# Patient Record
Sex: Male | Born: 1945 | Race: Black or African American | Hispanic: No | Marital: Married | Smoking: Former smoker
Health system: Southern US, Community
[De-identification: ages and names within clinical notes are randomized; demographics above are authoritative.]

## PROBLEM LIST (undated history)

## (undated) DIAGNOSIS — I219 Acute myocardial infarction, unspecified: Secondary | ICD-10-CM

## (undated) DIAGNOSIS — B1920 Unspecified viral hepatitis C without hepatic coma: Secondary | ICD-10-CM

## (undated) DIAGNOSIS — E118 Type 2 diabetes mellitus with unspecified complications: Secondary | ICD-10-CM

## (undated) DIAGNOSIS — I214 Non-ST elevation (NSTEMI) myocardial infarction: Secondary | ICD-10-CM

## (undated) DIAGNOSIS — I25119 Atherosclerotic heart disease of native coronary artery with unspecified angina pectoris: Secondary | ICD-10-CM

## (undated) DIAGNOSIS — I10 Essential (primary) hypertension: Secondary | ICD-10-CM

## (undated) DIAGNOSIS — Z951 Presence of aortocoronary bypass graft: Secondary | ICD-10-CM

## (undated) DIAGNOSIS — E785 Hyperlipidemia, unspecified: Secondary | ICD-10-CM

## (undated) DIAGNOSIS — D649 Anemia, unspecified: Secondary | ICD-10-CM

## (undated) HISTORY — DX: Essential (primary) hypertension: I10

## (undated) HISTORY — PX: LAPAROSCOPIC CHOLECYSTECTOMY: SUR755

## (undated) HISTORY — PX: UPPER GASTROINTESTINAL ENDOSCOPY: SHX188

## (undated) HISTORY — DX: Type 2 diabetes mellitus with unspecified complications: E11.8

## (undated) HISTORY — DX: Presence of aortocoronary bypass graft: Z95.1

## (undated) HISTORY — PX: COLONOSCOPY: SHX174

## (undated) HISTORY — DX: Anemia, unspecified: D64.9

## (undated) HISTORY — DX: Atherosclerotic heart disease of native coronary artery with unspecified angina pectoris: I25.119

## (undated) HISTORY — DX: Non-ST elevation (NSTEMI) myocardial infarction: I21.4

---

## 1999-09-04 DIAGNOSIS — I219 Acute myocardial infarction, unspecified: Secondary | ICD-10-CM

## 1999-09-04 DIAGNOSIS — Z951 Presence of aortocoronary bypass graft: Secondary | ICD-10-CM

## 1999-09-04 HISTORY — PX: CORONARY ARTERY BYPASS GRAFT: SHX141

## 1999-09-04 HISTORY — DX: Presence of aortocoronary bypass graft: Z95.1

## 1999-09-04 HISTORY — DX: Acute myocardial infarction, unspecified: I21.9

## 2003-09-04 DIAGNOSIS — I25119 Atherosclerotic heart disease of native coronary artery with unspecified angina pectoris: Secondary | ICD-10-CM

## 2003-09-04 HISTORY — DX: Atherosclerotic heart disease of native coronary artery with unspecified angina pectoris: I25.119

## 2004-07-06 DIAGNOSIS — I2510 Atherosclerotic heart disease of native coronary artery without angina pectoris: Secondary | ICD-10-CM | POA: Diagnosis present

## 2012-05-06 DIAGNOSIS — K3189 Other diseases of stomach and duodenum: Secondary | ICD-10-CM | POA: Insufficient documentation

## 2012-05-06 DIAGNOSIS — K31A0 Gastric intestinal metaplasia, unspecified: Secondary | ICD-10-CM | POA: Insufficient documentation

## 2012-11-18 DIAGNOSIS — B182 Chronic viral hepatitis C: Secondary | ICD-10-CM | POA: Insufficient documentation

## 2014-03-02 ENCOUNTER — Other Ambulatory Visit: Payer: Self-pay | Admitting: Gastroenterology

## 2014-03-02 ENCOUNTER — Other Ambulatory Visit: Payer: Self-pay | Admitting: *Deleted

## 2014-03-02 DIAGNOSIS — B182 Chronic viral hepatitis C: Secondary | ICD-10-CM

## 2014-03-02 DIAGNOSIS — K7460 Unspecified cirrhosis of liver: Secondary | ICD-10-CM

## 2014-03-30 ENCOUNTER — Ambulatory Visit
Admission: RE | Admit: 2014-03-30 | Discharge: 2014-03-30 | Disposition: A | Discharge status: Home / Self Care | Payer: Medicare Other | Source: Ambulatory Visit | Attending: *Deleted | Admitting: *Deleted

## 2014-03-30 DIAGNOSIS — K7460 Unspecified cirrhosis of liver: Secondary | ICD-10-CM

## 2014-03-30 DIAGNOSIS — B182 Chronic viral hepatitis C: Secondary | ICD-10-CM

## 2014-03-30 MED ORDER — GADOXETATE DISODIUM 0.25 MMOL/ML IV SOLN
8.0000 mL | Freq: Once | INTRAVENOUS | Status: AC | PRN
  Administered 2014-03-30: 8 mL via INTRAVENOUS

## 2016-02-29 ENCOUNTER — Encounter (INDEPENDENT_AMBULATORY_CARE_PROVIDER_SITE_OTHER): Payer: Medicare Other | Admitting: Podiatry

## 2016-02-29 NOTE — Progress Notes (Signed)
This encounter was created in error - please disregard.

## 2016-04-03 DIAGNOSIS — I214 Non-ST elevation (NSTEMI) myocardial infarction: Secondary | ICD-10-CM

## 2016-04-03 HISTORY — DX: Non-ST elevation (NSTEMI) myocardial infarction: I21.4

## 2016-04-03 HISTORY — PX: TRANSTHORACIC ECHOCARDIOGRAM: SHX275

## 2016-04-21 ENCOUNTER — Emergency Department (HOSPITAL_COMMUNITY): Payer: Medicare Other

## 2016-04-21 ENCOUNTER — Inpatient Hospital Stay (HOSPITAL_COMMUNITY)
Admission: EM | Admit: 2016-04-21 | Discharge: 2016-04-24 | DRG: 246 | Disposition: A | Discharge status: Home / Self Care | Payer: Medicare Other | Attending: Cardiology | Admitting: Cardiology

## 2016-04-21 ENCOUNTER — Encounter (HOSPITAL_COMMUNITY): Payer: Self-pay | Admitting: Emergency Medicine

## 2016-04-21 DIAGNOSIS — R001 Bradycardia, unspecified: Secondary | ICD-10-CM | POA: Diagnosis present

## 2016-04-21 DIAGNOSIS — I25110 Atherosclerotic heart disease of native coronary artery with unstable angina pectoris: Secondary | ICD-10-CM | POA: Diagnosis present

## 2016-04-21 DIAGNOSIS — I10 Essential (primary) hypertension: Secondary | ICD-10-CM | POA: Diagnosis present

## 2016-04-21 DIAGNOSIS — I214 Non-ST elevation (NSTEMI) myocardial infarction: Secondary | ICD-10-CM | POA: Diagnosis present

## 2016-04-21 DIAGNOSIS — R51 Headache: Secondary | ICD-10-CM | POA: Diagnosis present

## 2016-04-21 DIAGNOSIS — E785 Hyperlipidemia, unspecified: Secondary | ICD-10-CM | POA: Diagnosis not present

## 2016-04-21 DIAGNOSIS — G479 Sleep disorder, unspecified: Secondary | ICD-10-CM | POA: Diagnosis present

## 2016-04-21 DIAGNOSIS — E119 Type 2 diabetes mellitus without complications: Secondary | ICD-10-CM | POA: Diagnosis not present

## 2016-04-21 DIAGNOSIS — I160 Hypertensive urgency: Secondary | ICD-10-CM | POA: Diagnosis present

## 2016-04-21 DIAGNOSIS — Y838 Other surgical procedures as the cause of abnormal reaction of the patient, or of later complication, without mention of misadventure at the time of the procedure: Secondary | ICD-10-CM | POA: Diagnosis not present

## 2016-04-21 DIAGNOSIS — Z8674 Personal history of sudden cardiac arrest: Secondary | ICD-10-CM | POA: Diagnosis not present

## 2016-04-21 DIAGNOSIS — I25119 Atherosclerotic heart disease of native coronary artery with unspecified angina pectoris: Secondary | ICD-10-CM | POA: Diagnosis present

## 2016-04-21 DIAGNOSIS — E1122 Type 2 diabetes mellitus with diabetic chronic kidney disease: Secondary | ICD-10-CM | POA: Diagnosis not present

## 2016-04-21 DIAGNOSIS — I119 Hypertensive heart disease without heart failure: Secondary | ICD-10-CM | POA: Diagnosis not present

## 2016-04-21 DIAGNOSIS — I25810 Atherosclerosis of coronary artery bypass graft(s) without angina pectoris: Secondary | ICD-10-CM | POA: Diagnosis not present

## 2016-04-21 DIAGNOSIS — T82858A Stenosis of vascular prosthetic devices, implants and grafts, initial encounter: Secondary | ICD-10-CM | POA: Diagnosis not present

## 2016-04-21 DIAGNOSIS — Z951 Presence of aortocoronary bypass graft: Secondary | ICD-10-CM

## 2016-04-21 DIAGNOSIS — E1121 Type 2 diabetes mellitus with diabetic nephropathy: Secondary | ICD-10-CM | POA: Diagnosis not present

## 2016-04-21 DIAGNOSIS — I213 ST elevation (STEMI) myocardial infarction of unspecified site: Secondary | ICD-10-CM | POA: Diagnosis not present

## 2016-04-21 DIAGNOSIS — I2510 Atherosclerotic heart disease of native coronary artery without angina pectoris: Secondary | ICD-10-CM | POA: Diagnosis present

## 2016-04-21 DIAGNOSIS — E1169 Type 2 diabetes mellitus with other specified complication: Secondary | ICD-10-CM | POA: Diagnosis present

## 2016-04-21 DIAGNOSIS — I25700 Atherosclerosis of coronary artery bypass graft(s), unspecified, with unstable angina pectoris: Secondary | ICD-10-CM | POA: Diagnosis not present

## 2016-04-21 HISTORY — DX: Hyperlipidemia, unspecified: E78.5

## 2016-04-21 HISTORY — DX: Unspecified viral hepatitis C without hepatic coma: B19.20

## 2016-04-21 LAB — I-STAT TROPONIN, ED: Troponin i, poc: 0.93 ng/mL (ref 0.00–0.08)

## 2016-04-21 LAB — BASIC METABOLIC PANEL
Anion gap: 6 (ref 5–15)
BUN: 16 mg/dL (ref 6–20)
CALCIUM: 9.4 mg/dL (ref 8.9–10.3)
CHLORIDE: 106 mmol/L (ref 101–111)
CO2: 23 mmol/L (ref 22–32)
CREATININE: 1.13 mg/dL (ref 0.61–1.24)
GFR calc non Af Amer: >60 mL/min (ref >60)
GLUCOSE: 137 mg/dL — AB (ref 65–99)
Potassium: 3.8 mmol/L (ref 3.5–5.1)
Sodium: 135 mmol/L (ref 135–145)

## 2016-04-21 LAB — HEPATIC FUNCTION PANEL
ALK PHOS: 43 U/L (ref 38–126)
ALT: 20 U/L (ref 17–63)
AST: 40 U/L (ref 15–41)
Albumin: 3.8 g/dL (ref 3.5–5.0)
Bilirubin, Direct: <0.1 mg/dL — ABNORMAL LOW (ref 0.1–0.5)
TOTAL PROTEIN: 7.4 g/dL (ref 6.5–8.1)
Total Bilirubin: 0.2 mg/dL — ABNORMAL LOW (ref 0.3–1.2)

## 2016-04-21 LAB — LIPID PANEL
CHOL/HDL RATIO: 3.1 ratio
Cholesterol: 183 mg/dL (ref 0–200)
HDL: 60 mg/dL (ref >40)
LDL CALC: 109 mg/dL — AB (ref 0–99)
Triglycerides: 68 mg/dL (ref <150)
VLDL: 14 mg/dL (ref 0–40)

## 2016-04-21 LAB — CBC
HCT: 39.9 % (ref 39.0–52.0)
Hemoglobin: 12.9 g/dL — ABNORMAL LOW (ref 13.0–17.0)
MCH: 23.1 pg — ABNORMAL LOW (ref 26.0–34.0)
MCHC: 32.3 g/dL (ref 30.0–36.0)
MCV: 71.5 fL — AB (ref 78.0–100.0)
PLATELETS: 154 10*3/uL (ref 150–400)
RBC: 5.58 MIL/uL (ref 4.22–5.81)
RDW: 15 % (ref 11.5–15.5)
WBC: 6.3 10*3/uL (ref 4.0–10.5)

## 2016-04-21 LAB — PROTIME-INR
INR: 1.07
PROTHROMBIN TIME: 14 s (ref 11.4–15.2)

## 2016-04-21 LAB — TROPONIN I
TROPONIN I: 3.46 ng/mL — AB (ref <0.03)
Troponin I: 1.17 ng/mL (ref <0.03)

## 2016-04-21 LAB — APTT: aPTT: 31 seconds (ref 24–36)

## 2016-04-21 LAB — TSH: TSH: 0.871 u[IU]/mL (ref 0.350–4.500)

## 2016-04-21 MED ORDER — ACETAMINOPHEN 325 MG PO TABS: 650.0000 mg | ORAL_TABLET | ORAL | Status: DC | PRN

## 2016-04-21 MED ORDER — LISINOPRIL 10 MG PO TABS
20.0000 mg | ORAL_TABLET | Freq: Every day | ORAL | Status: DC
  Administered 2016-04-21 – 2016-04-24 (×3): 20 mg via ORAL
  Filled 2016-04-21: qty 2
  Filled 2016-04-21 (×2): qty 1

## 2016-04-21 MED ORDER — METFORMIN HCL ER 500 MG PO TB24
500.0000 mg | ORAL_TABLET | Freq: Every day | ORAL | Status: DC
  Filled 2016-04-21 (×2): qty 1

## 2016-04-21 MED ORDER — NITROGLYCERIN 0.4 MG SL SUBL: 0.4000 mg | SUBLINGUAL_TABLET | SUBLINGUAL | Status: DC | PRN

## 2016-04-21 MED ORDER — OMEGA-3-ACID ETHYL ESTERS 1 G PO CAPS
1.0000 g | ORAL_CAPSULE | Freq: Every day | ORAL | Status: DC
  Administered 2016-04-22 – 2016-04-24 (×2): 1 g via ORAL
  Filled 2016-04-21 (×2): qty 1

## 2016-04-21 MED ORDER — NITROGLYCERIN IN D5W 200-5 MCG/ML-% IV SOLN
5.0000 ug/min | INTRAVENOUS | Status: DC
  Administered 2016-04-21: 5 ug/min via INTRAVENOUS
  Administered 2016-04-22: 40 ug/min via INTRAVENOUS
  Filled 2016-04-21 (×2): qty 250

## 2016-04-21 MED ORDER — ASPIRIN EC 81 MG PO TBEC
81.0000 mg | DELAYED_RELEASE_TABLET | Freq: Every day | ORAL | Status: DC
  Administered 2016-04-22 – 2016-04-24 (×3): 81 mg via ORAL
  Filled 2016-04-21 (×3): qty 1

## 2016-04-21 MED ORDER — ONDANSETRON HCL 4 MG/2ML IJ SOLN
4.0000 mg | Freq: Four times a day (QID) | INTRAMUSCULAR | Status: DC | PRN
Start: 2016-04-21 — End: 2016-04-24

## 2016-04-21 MED ORDER — HYDROCHLOROTHIAZIDE 25 MG PO TABS
12.5000 mg | ORAL_TABLET | Freq: Every day | ORAL | Status: DC
Start: 2016-04-21 — End: 2016-04-24
  Administered 2016-04-21 – 2016-04-24 (×3): 12.5 mg via ORAL
  Filled 2016-04-21 (×3): qty 1

## 2016-04-21 MED ORDER — ASPIRIN 81 MG PO CHEW
324.0000 mg | CHEWABLE_TABLET | Freq: Once | ORAL | Status: AC
  Administered 2016-04-21: 324 mg via ORAL
  Filled 2016-04-21: qty 4

## 2016-04-21 MED ORDER — METOPROLOL SUCCINATE ER 25 MG PO TB24: 12.5000 mg | ORAL_TABLET | Freq: Every day | ORAL | Status: DC

## 2016-04-21 MED ORDER — LISINOPRIL 20 MG PO TABS: 20.0000 mg | ORAL_TABLET | Freq: Every day | ORAL | Status: DC

## 2016-04-21 MED ORDER — MORPHINE SULFATE (PF) 2 MG/ML IV SOLN
2.0000 mg | Freq: Once | INTRAVENOUS | Status: DC
Start: 2016-04-21 — End: 2016-04-24

## 2016-04-21 MED ORDER — HYDROCHLOROTHIAZIDE 25 MG PO TABS: 12.5000 mg | ORAL_TABLET | Freq: Every day | ORAL | Status: DC

## 2016-04-21 MED ORDER — CALCIUM CARBONATE 1250 (500 CA) MG PO TABS
2.0000 | ORAL_TABLET | Freq: Every day | ORAL | Status: DC
  Administered 2016-04-22 – 2016-04-24 (×2): 1000 mg via ORAL
  Filled 2016-04-21 (×2): qty 1

## 2016-04-21 MED ORDER — HEPARIN (PORCINE) IN NACL 100-0.45 UNIT/ML-% IJ SOLN
1100.0000 [IU]/h | INTRAMUSCULAR | Status: DC
  Administered 2016-04-21 – 2016-04-22 (×2): 1100 [IU]/h via INTRAVENOUS
  Filled 2016-04-21 (×2): qty 250

## 2016-04-21 MED ORDER — NITROGLYCERIN 2 % TD OINT
1.0000 [in_us] | TOPICAL_OINTMENT | Freq: Once | TRANSDERMAL | Status: AC
  Administered 2016-04-21: 1 [in_us] via TOPICAL
  Filled 2016-04-21: qty 1

## 2016-04-21 MED ORDER — PRAVASTATIN SODIUM 40 MG PO TABS
80.0000 mg | ORAL_TABLET | Freq: Every day | ORAL | Status: DC
  Administered 2016-04-22: 80 mg via ORAL
  Filled 2016-04-21: qty 2

## 2016-04-21 MED ORDER — ALUM & MAG HYDROXIDE-SIMETH 200-200-20 MG/5ML PO SUSP
30.0000 mL | ORAL | Status: DC | PRN
  Administered 2016-04-22: 30 mL via ORAL
  Filled 2016-04-21: qty 30

## 2016-04-21 MED ORDER — METOPROLOL SUCCINATE ER 25 MG PO TB24
12.5000 mg | ORAL_TABLET | Freq: Every day | ORAL | Status: DC
  Administered 2016-04-21 – 2016-04-24 (×2): 12.5 mg via ORAL
  Filled 2016-04-21 (×3): qty 1

## 2016-04-21 MED ORDER — HEPARIN BOLUS VIA INFUSION
4000.0000 [IU] | Freq: Once | INTRAVENOUS | Status: AC
  Administered 2016-04-21: 4000 [IU] via INTRAVENOUS
  Filled 2016-04-21: qty 4000

## 2016-04-21 MED ORDER — MORPHINE SULFATE (PF) 4 MG/ML IV SOLN
4.0000 mg | Freq: Once | INTRAVENOUS | Status: AC
  Administered 2016-04-21: 4 mg via INTRAVENOUS
  Filled 2016-04-21: qty 1

## 2016-04-21 NOTE — ED Notes (Signed)
Nitroglycerin infusion increased to 30 mcg/min at this time.  Pt rates his chest pain at a 3/10.  Pt's bp noted to be 158/88.

## 2016-04-21 NOTE — ED Notes (Signed)
Nitro paste removed.

## 2016-04-21 NOTE — ED Notes (Signed)
Nitroglycerin infusion increased to 25 mcg/min at this time. Pt rates his chest pain at a 4/10.  Bp noted to be 169/89.

## 2016-04-21 NOTE — ED Notes (Signed)
Report called to Wells Guiles, RN at this time.  Receiving nurse denies having any further questions at this time.

## 2016-04-21 NOTE — ED Triage Notes (Signed)
Pt c/o center chest pain burning onset yesterday with nausea.

## 2016-04-21 NOTE — ED Notes (Signed)
Nitroglycerin infusion increased to 10 mcg/min at this time.  Pt reports chest pain rated 5/10.

## 2016-04-21 NOTE — ED Notes (Signed)
Nitroglycerin infusion increased to 15 mcg/min at this time.  Pt reports chest pain remains at a 5/10.  Pt's bp noted to be 161/87.

## 2016-04-21 NOTE — Progress Notes (Signed)
ANTICOAGULATION CONSULT NOTE - Initial Consult  Pharmacy Consult for heparin Indication: chest pain/ACS  No Known Allergies  Patient Measurements: Height: 6\' 1"  (185.4 cm) Weight: 189 lb 2 oz (85.8 kg) IBW/kg (Calculated) : 79.9 Heparin Dosing Weight: 85.8 kg  Vital Signs: Temp: 97.8 F (36.6 C) (08/19 1605) Temp Source: Oral (08/19 1605) BP: 186/96 (08/19 1745) Pulse Rate: 56 (08/19 1745)  Labs:  Recent Labs  04/21/16 1614 04/21/16 1748  HGB 12.9*  --   HCT 39.9  --   PLT 154  --   APTT  --  31  LABPROT  --  14.0  INR  --  1.07  CREATININE 1.13  --     Estimated Creatinine Clearance: 70.7 mL/min (by C-G formula based on SCr of 1.13 mg/dL).   Medical History: Past Medical History:  Diagnosis Date  . Diabetes mellitus without complication ([place])   . Hypertension     Assessment: 70 yo m admitted with CP.  Pharmacy is consulted to start heparin for ACS. PTA med list not reviewed yet, but as far as I can see, pt is not on any anticoagulation PTA. CBC wnl at baseline.  Goal of Therapy:  Heparin level 0.3-0.7 units/ml Monitor platelets by anticoagulation protocol: Yes   Plan:  - Heparin bolus 4,000 units x 1 - Heparin infusion at 1100 units/hr - 6-hr HL @ 0100 - Daily HL, CBC - Monitor s/sx of bleeding  Cassie L. Nicole Kindred, PharmD Clinical Pharmacist Pager: [phone] 04/21/2016 6:19 PM

## 2016-04-21 NOTE — H&P (Signed)
Cardiology H&P    Patient ID: Brandon Vasquez MRN: [id number], DOB/AGE: 1945-10-21   Admit date: 04/21/2016 Date of Consult: 04/21/2016  Primary Physician: No PCP Per Patient Reason for Admission: Chest Pain    History of Present Illness    70 year old male with past medical history of hypertension, diabetes, coronary artery disease s/p 5 vessel CABG (grafts unknown), and hyperlipidemia presents with a chief complaint of chest pain that he describes a burning in sensation. The patient had a coronary artery bypass in 2001 and prior to his surgery he had similar burning sensation without radiation. As per the patient, prior to his surgery he had a cardiac arrest in the cardiac catheterization lab. He states that the pain is not associated with radiation or diaphoresis. He rates the pain a 8/10 upon his initial interview despite the nitropatch. In the emergency department, the patient was given 2mg  of morphine and a nitro patch. The pain did not improve but he was then started on intravenous nitroglycerin and his pain has since improved.   Past Medical History   Past Medical History:  Diagnosis Date  . Diabetes mellitus without complication ([place])   . Hypertension     Past Surgical History:  Procedure Laterality Date  . CORONARY ARTERY BYPASS GRAFT       Allergies  No Known Allergies  Inpatient Medications    .  morphine injection  2 mg Intravenous Once    Family History    No family history on file.  Social History    Social History   Social History  . Marital status: Married    Spouse name: N/A  . Number of children: N/A  . Years of education: N/A   Occupational History  . Not on file.   Social History Main Topics  . Smoking status: Never Smoker  . Smokeless tobacco: Never Used  . Alcohol use No  . Drug use:     Types: Marijuana  . Sexual activity: Not on file   Other Topics Concern  . Not on file   Social History Narrative  . No narrative on file      Review of Systems    General:  No chills, fever, night sweats or weight changes.  Cardiovascular:  Positive for chest pain.  Dermatological: No rash, lesions/masses Respiratory: No cough, dyspnea Urologic: No hematuria, dysuria Abdominal:   No nausea, vomiting, diarrhea, bright red blood per rectum, melena, or hematemesis Neurologic:  No visual changes, wkns, changes in mental status. All other systems reviewed and are otherwise negative except as noted above.  Physical Exam    Blood pressure 158/88, pulse (!) 50, temperature 97.8 F (36.6 C), temperature source Oral, resp. rate 16, height 6\' 1"  (1.854 m), weight 189 lb 2 oz (85.8 kg), SpO2 100 %.  GEN: NAD, A&Ox3 HEENT: NCAT, EOMI, No JVD CV: RRR, +S1/S2, No m/r/g's appreciated.   Pulm: CTA bilaterally  Abdomen: +BS, NTND Extremities: No c/c/e, 2+ pulses  Neuro: No focal neurological deficits, CN II-XII Grossly intact.   Labs    Troponin [facility] Test)  Recent Labs  04/21/16 1619  TROPIPOC 0.93*    Recent Labs  04/21/16 1748  TROPONINI 1.17*   Lab Results  Component Value Date   WBC 6.3 04/21/2016   HGB 12.9 (L) 04/21/2016   HCT 39.9 04/21/2016   MCV 71.5 (L) 04/21/2016   PLT 154 04/21/2016    Recent Labs Lab 04/21/16 1614 04/21/16 1748  NA 135  --  K 3.8  --   CL 106  --   CO2 23  --   BUN 16  --   CREATININE 1.13  --   CALCIUM 9.4  --   PROT  --  7.4  BILITOT  --  0.2*  ALKPHOS  --  43  ALT  --  20  AST  --  40  GLUCOSE 137*  --    Lab Results  Component Value Date   CHOL 183 04/21/2016   HDL 60 04/21/2016   LDLCALC 109 (H) 04/21/2016   TRIG 68 04/21/2016   No results found for: [facility]   Radiology Studies    Dg Chest 2 View  Result Date: 04/21/2016 CLINICAL DATA:  Central chest pain starting yesterday, nausea EXAM: CHEST  2 VIEW COMPARISON:  None. FINDINGS: Cardiomediastinal silhouette is unremarkable. Status post CABG. No acute infiltrate or pleural effusion. No pulmonary  edema. Bony thorax is unremarkable. IMPRESSION: No active cardiopulmonary disease. Electronically Signed   By: Lahoma Crocker M.D.   On: 04/21/2016 17:19    EKG & Cardiac Imaging    EKG: NSR with no acute ST/T wave abnormalities.    Assessment & Plan    70 year old male with past medical history of hypertension, diabetes, coronary artery disease s/p 5 vessel CABG (grafts unknown), and hyperlipidemia presents with a chief complaint of chest pain.  1. NSTEMI -Will check cardiac enzymes x 3; patient's chest pain likely his anginal equivalent; NSTEMI likely a Type II MI in the setting of this hypertension. (SBP 200 on admission) -Will continue IV nitro gtt and monitor in CCU. Continue ASA, statin, beta blockade therapy, and lisinopril.  -Check 2D echo; Case reviewed with Dr. Ellyn Hack and no need for cath lab at this time. If there are RWMA's on echocardiogram will pursue further ischemic workup with likely a nuclear stress test.   2. CAD s/p CABG (grafts unknown) -Will continue statin therapy, ACEi, and Toprol Xl.   3. Hypertension -Patient's blood pressure improving with nitro gtt; Will continue to titrate as needed. Will continue nitro and outpatient medications.   4. Diabetes -Continue outpatient meds.   Signed,  Lucas Cell: [phone]

## 2016-04-21 NOTE — Plan of Care (Signed)
Problem: Health Behavior/Discharge Planning: Goal: Ability to manage health-related needs will improve Outcome: Progressing Discussed advanced directive, patient and wife have talked and agree she is his decision maker if needed, consult to spiritual care to make official  Patient appears to have good support with wife and has good coping behaviors  Problem: Activity: Goal: Risk for activity intolerance will decrease Outcome: Not Progressing Currently on bedrest

## 2016-04-21 NOTE — ED Provider Notes (Signed)
[facility] DEPT Provider Note   CSN: [id number] Arrival date & time: 04/21/16  1600     History   Chief Complaint Chief Complaint  Patient presents with  . Chest Pain    HPI Brandon Vasquez is a 70 y.o. male.  HPI  Patient history of diabetes, hypertension, hyperlipidemia, CABG X5, presents for chest pain. Started yesterday. Has been off and on. It feels like a burning sensation that he currently rates as a 6 out of 10. Denies any shortness of breath, coughs, fevers. He does have associated nausea with the pain.  Past Medical History:  Diagnosis Date  . Diabetes mellitus without complication ([place])   . Hypertension     There are no active problems to display for this patient.   Past Surgical History:  Procedure Laterality Date  . CORONARY ARTERY BYPASS GRAFT         Home Medications    Prior to Admission medications   Not on File    Family History No family history on file.  Social History Social History  Substance Use Topics  . Smoking status: Never Smoker  . Smokeless tobacco: Never Used  . Alcohol use No     Allergies   Review of patient's allergies indicates no known allergies.   Review of Systems Review of Systems  Constitutional: Negative for chills and fever.  HENT: Negative for ear pain and sore throat.   Eyes: Negative for pain and visual disturbance.  Respiratory: Negative for cough and shortness of breath.   Cardiovascular: Positive for chest pain. Negative for palpitations.  Gastrointestinal: Positive for nausea. Negative for abdominal pain and vomiting.  Genitourinary: Negative for dysuria and hematuria.  Musculoskeletal: Negative for arthralgias and back pain.  Skin: Negative for color change and rash.  Neurological: Negative for seizures and syncope.  All other systems reviewed and are negative.    Physical Exam Updated Vital Signs BP 186/96   Pulse (!) 56   Temp 97.8 F (36.6 C) (Oral)   Resp 12   Ht 6\' 1"  (1.854 m)    Wt 85.8 kg   SpO2 100%   BMI 24.95 kg/m   Physical Exam  Constitutional: He appears well-developed and well-nourished.  HENT:  Head: Normocephalic and atraumatic.  Eyes: Conjunctivae are normal.  Neck: Neck supple.  Cardiovascular: Normal rate and regular rhythm.   No murmur heard. Pulmonary/Chest: Effort normal and breath sounds normal. No respiratory distress.  Abdominal: Soft. There is no tenderness.  Musculoskeletal: He exhibits no edema.  Neurological: He is alert.  Skin: Skin is warm and dry.  Psychiatric: He has a normal mood and affect.  Nursing note and vitals reviewed.    ED Treatments / Results  Labs (all labs ordered are listed, but only abnormal results are displayed) Labs Reviewed  BASIC METABOLIC PANEL - Abnormal; Notable for the following:       Result Value   Glucose, Bld 137 (*)    All other components within normal limits  CBC - Abnormal; Notable for the following:    Hemoglobin 12.9 (*)    MCV 71.5 (*)    MCH 23.1 (*)    All other components within normal limits  I-STAT TROPOININ, ED - Abnormal; Notable for the following:    Troponin i, poc 0.93 (*)    All other components within normal limits  PROTIME-INR  APTT  TROPONIN I  HEPATIC FUNCTION PANEL  LIPID PANEL  TSH    EKG  EKG Interpretation  Date/Time:  Saturday April 21 2016 16:10:40 EDT Ventricular Rate:  57 PR Interval:  158 QRS Duration: 80 QT Interval:  424 QTC Calculation: 412 R Axis:   82 Text Interpretation:  Unusual P axis, possible ectopic atrial bradycardia Abnormal ECG No old tracing to compare Confirmed by FLOYD MD, DANIEL [id number]) on 04/21/2016 5:11:47 PM       Radiology Dg Chest 2 View  Result Date: 04/21/2016 CLINICAL DATA:  Central chest pain starting yesterday, nausea EXAM: CHEST  2 VIEW COMPARISON:  None. FINDINGS: Cardiomediastinal silhouette is unremarkable. Status post CABG. No acute infiltrate or pleural effusion. No pulmonary edema. Bony thorax is  unremarkable. IMPRESSION: No active cardiopulmonary disease. Electronically Signed   By: Lahoma Crocker M.D.   On: 04/21/2016 17:19    Procedures Procedures (including critical care time)  Medications Ordered in ED Medications - No data to display   Initial Impression / Assessment and Plan / ED Course  I have reviewed the triage vital signs and the nursing notes.  Pertinent labs & imaging results that were available during my care of the patient were reviewed by me and considered in my medical decision making (see chart for details).  Clinical Course    Patient was clinically stable. EKG was normal sinus rhythm, no ischemic changes. Troponin was elevated. Concern for an NSTEMI. Aspirin, heparin, nitroglycerin ordered. Cardiology consulted patient will be admitted.  Patient stable upon admission.  Final Clinical Impressions(s) / ED Diagnoses   Final diagnoses:  None    New Prescriptions New Prescriptions   No medications on file     Levada Schilling, MD 04/21/16 [place], MD 04/22/16 [phone]

## 2016-04-21 NOTE — ED Notes (Signed)
Nitroglycerin infusion increased to 35 mcg/min at this time.  Pt continues to rate his chest pain at a 3/10.  Bp noted to be 153/85.

## 2016-04-21 NOTE — ED Notes (Signed)
Lab called with I-stat Troponin 0.93.  Called and reported to Dr. Tyrone Nine.  No orders received.

## 2016-04-21 NOTE — ED Notes (Signed)
Nitroglycerin infusion increased to 20 mcg/min at this time.  Pt rates his chest pain at a 4/10.  bp noted to be 169/82.

## 2016-04-22 ENCOUNTER — Inpatient Hospital Stay (HOSPITAL_COMMUNITY): Payer: Medicare Other

## 2016-04-22 ENCOUNTER — Encounter (HOSPITAL_COMMUNITY): Payer: Self-pay | Admitting: *Deleted

## 2016-04-22 DIAGNOSIS — I214 Non-ST elevation (NSTEMI) myocardial infarction: Secondary | ICD-10-CM

## 2016-04-22 DIAGNOSIS — I25700 Atherosclerosis of coronary artery bypass graft(s), unspecified, with unstable angina pectoris: Secondary | ICD-10-CM

## 2016-04-22 DIAGNOSIS — I213 ST elevation (STEMI) myocardial infarction of unspecified site: Secondary | ICD-10-CM

## 2016-04-22 DIAGNOSIS — I160 Hypertensive urgency: Secondary | ICD-10-CM

## 2016-04-22 LAB — BASIC METABOLIC PANEL
ANION GAP: 13 (ref 5–15)
BUN: 14 mg/dL (ref 6–20)
CHLORIDE: 104 mmol/L (ref 101–111)
CO2: 22 mmol/L (ref 22–32)
CREATININE: 1.04 mg/dL (ref 0.61–1.24)
Calcium: 9.4 mg/dL (ref 8.9–10.3)
GFR calc non Af Amer: >60 mL/min (ref >60)
Glucose, Bld: 128 mg/dL — ABNORMAL HIGH (ref 65–99)
POTASSIUM: 3.9 mmol/L (ref 3.5–5.1)
SODIUM: 139 mmol/L (ref 135–145)

## 2016-04-22 LAB — HEPARIN LEVEL (UNFRACTIONATED)
Heparin Unfractionated: 0.69 IU/mL (ref 0.30–0.70)
Heparin Unfractionated: 0.66 IU/mL (ref 0.30–0.70)
Heparin Unfractionated: 0.83 IU/mL — ABNORMAL HIGH (ref 0.30–0.70)
Heparin Unfractionated: 0.48 IU/mL (ref 0.30–0.70)

## 2016-04-22 LAB — TROPONIN I
Troponin I: 5.57 ng/mL (ref <0.03)
Troponin I: 3.63 ng/mL (ref <0.03)

## 2016-04-22 LAB — CBC
HCT: 36.4 % — ABNORMAL LOW (ref 39.0–52.0)
Hemoglobin: 11.6 g/dL — ABNORMAL LOW (ref 13.0–17.0)
MCH: 22.8 pg — ABNORMAL LOW (ref 26.0–34.0)
MCHC: 31.9 g/dL (ref 30.0–36.0)
MCV: 71.5 fL — AB (ref 78.0–100.0)
PLATELETS: 122 10*3/uL — AB (ref 150–400)
RBC: 5.09 MIL/uL (ref 4.22–5.81)
RDW: 15 % (ref 11.5–15.5)
WBC: 8 10*3/uL (ref 4.0–10.5)

## 2016-04-22 LAB — LIPID PANEL
CHOLESTEROL: 162 mg/dL (ref 0–200)
HDL: 53 mg/dL (ref >40)
LDL Cholesterol: 95 mg/dL (ref 0–99)
TRIGLYCERIDES: 69 mg/dL (ref <150)
Total CHOL/HDL Ratio: 3.1 RATIO
VLDL: 14 mg/dL (ref 0–40)

## 2016-04-22 LAB — ECHOCARDIOGRAM COMPLETE
Height: 73 in
WEIGHTICAEL: 3026 [oz_av]

## 2016-04-22 LAB — GLUCOSE, CAPILLARY
GLUCOSE-CAPILLARY: 126 mg/dL — AB (ref 65–99)
GLUCOSE-CAPILLARY: 117 mg/dL — AB (ref 65–99)
GLUCOSE-CAPILLARY: 112 mg/dL — AB (ref 65–99)

## 2016-04-22 LAB — PROTIME-INR
INR: 1.16
Prothrombin Time: 14.8 seconds (ref 11.4–15.2)

## 2016-04-22 LAB — MRSA PCR SCREENING: MRSA BY PCR: POSITIVE — AB

## 2016-04-22 MED ORDER — MUPIROCIN 2 % EX OINT: 1.0000 "application " | TOPICAL_OINTMENT | Freq: Two times a day (BID) | CUTANEOUS | Status: DC

## 2016-04-22 MED ORDER — ZOLPIDEM TARTRATE 5 MG PO TABS: 5.0000 mg | ORAL_TABLET | Freq: Every day | ORAL | Status: DC

## 2016-04-22 MED ORDER — MUPIROCIN 2 % EX OINT
1.0000 "application " | TOPICAL_OINTMENT | Freq: Two times a day (BID) | CUTANEOUS | Status: DC
  Administered 2016-04-22 – 2016-04-24 (×6): 1 via NASAL
  Filled 2016-04-22 (×2): qty 22

## 2016-04-22 MED ORDER — HEPARIN (PORCINE) IN NACL 100-0.45 UNIT/ML-% IJ SOLN: 900.0000 [IU]/h | INTRAMUSCULAR | Status: DC

## 2016-04-22 MED ORDER — INSULIN ASPART 100 UNIT/ML ~~LOC~~ SOLN
1.0000 [IU] | SUBCUTANEOUS | Status: DC
  Administered 2016-04-22: 1 [IU] via SUBCUTANEOUS

## 2016-04-22 MED ORDER — CHLORHEXIDINE GLUCONATE CLOTH 2 % EX PADS: 6.0000 | MEDICATED_PAD | Freq: Every day | CUTANEOUS | Status: DC

## 2016-04-22 MED ORDER — CHLORHEXIDINE GLUCONATE CLOTH 2 % EX PADS
6.0000 | MEDICATED_PAD | Freq: Every day | CUTANEOUS | Status: DC
  Administered 2016-04-22 – 2016-04-23 (×2): 6 via TOPICAL

## 2016-04-22 MED ORDER — ZOLPIDEM TARTRATE 5 MG PO TABS
5.0000 mg | ORAL_TABLET | Freq: Every day | ORAL | Status: DC
  Administered 2016-04-22 – 2016-04-23 (×2): 5 mg via ORAL
  Filled 2016-04-22 (×2): qty 1

## 2016-04-22 NOTE — Progress Notes (Signed)
Bilateral groins and right wrist shaved per precath orders

## 2016-04-22 NOTE — Progress Notes (Signed)
ANTICOAGULATION CONSULT NOTE - Follow Up Consult  Pharmacy Consult for Heparin  Indication: chest pain/ACS  No Known Allergies  Patient Measurements: Height: 6\' 1"  (185.4 cm) Weight: 185 lb 6.5 oz (84.1 kg) IBW/kg (Calculated) : 79.9  Vital Signs: Temp: 98.5 F (36.9 C) (08/20 1930) Temp Source: Oral (08/20 1930) BP: 115/72 (08/20 1930) Pulse Rate: 62 (08/20 1930)  Labs:  Recent Labs  04/21/16 1614  04/21/16 1748 04/21/16 2212  04/22/16 0324 04/22/16 0329 04/22/16 0554 04/22/16 1240 04/22/16 1939  HGB 12.9*  --   --   --   --  11.6*  --   --   --   --   HCT 39.9  --   --   --   --  36.4*  --   --   --   --   PLT 154  --   --   --   --  122*  --   --   --   --   APTT  --   --  31  --   --   --   --   --   --   --   LABPROT  --   --  14.0  --   --  14.8  --   --   --   --   INR  --   --  1.07  --   --  1.16  --   --   --   --   HEPARINUNFRC  --   --   --   --   < >  --  0.66  --  0.83* 0.48  CREATININE 1.13  --   --   --   --   --   --  1.04  --   --   TROPONINI  --   < > 1.17* 3.46*  --   --  5.57*  --  3.63*  --   < > = values in this interval not displayed.  Estimated Creatinine Clearance: 76.8 mL/min (by C-G formula based on SCr of 1.04 mg/dL).  Meds: Heparin gtt @ 900 units/hr  Assessment: 70 yo m admitted with CP on 04/21/2016 continues on IV heparin. Level is now therapeutic. No bleeding noted.   Goal of Therapy:  Heparin level 0.3-0.7 units/ml Monitor platelets by anticoagulation protocol: Yes   Plan:  - Continue heparin gtt to 900 units/hr - F/u AM heparin level and CBC  Salome Arnt, PharmD, BCPS Pager # [phone] 04/22/2016 8:22 PM

## 2016-04-22 NOTE — Progress Notes (Signed)
  Echocardiogram 2D Echocardiogram has been performed.  Brandon Vasquez 04/22/2016, 1:27 PM

## 2016-04-22 NOTE — Progress Notes (Signed)
Dr. Abigail Butts updated on patient condition and Troponin level continuing to rise

## 2016-04-22 NOTE — Plan of Care (Signed)
Problem: Education: Goal: Knowledge of [facility] Education information/materials will improve Outcome: Progressing Watched precath video with wife, questions answered

## 2016-04-22 NOTE — Progress Notes (Signed)
SUBJECTIVE: The patient is doing well today.  His CP is currently improved.  + HA with NTG. + difficulty sleeping last night  . aspirin EC  81 mg Oral Daily  . calcium carbonate  2 tablet Oral Q breakfast  . Chlorhexidine Gluconate Cloth  6 each Topical Q0600  . hydrochlorothiazide  12.5 mg Oral Daily  . lisinopril  20 mg Oral Daily  . metFORMIN  500 mg Oral Q breakfast  . metoprolol succinate  12.5 mg Oral Daily  .  morphine injection  2 mg Intravenous Once  . mupirocin ointment  1 application Nasal BID  . omega-3 acid ethyl esters  1 g Oral Daily  . pravastatin  80 mg Oral Daily   . heparin 1,100 Units/hr (04/22/16 1143)  . nitroGLYCERIN 40 mcg/min (04/22/16 1143)    OBJECTIVE: Physical Exam: Vitals:   04/22/16 0500 04/22/16 0600 04/22/16 0700 04/22/16 0800  BP: (!) 144/70 139/73 126/80   Pulse: (!) 54 (!) 53 (!) 58   Resp: 20 (!) 22 15   Temp:    98.3 F (36.8 C)  TempSrc:    Oral  SpO2: 100% 98% 100%   Weight:      Height:        Intake/Output Summary (Last 24 hours) at 04/22/16 1149 Last data filed at 04/22/16 1100  Gross per 24 hour  Intake           384.93 ml  Output              700 ml  Net          -315.07 ml    Telemetry reveals sinus rhythm  GEN- The patient is well appearing, alert and oriented x 3 today.   Head- normocephalic, atraumatic Eyes-  Sclera clear, conjunctiva pink Ears- hearing intact Oropharynx- clear Neck- supple,   Lungs- Clear to ausculation bilaterally, normal work of breathing Heart- Regular rate and rhythm, no murmurs, rubs or gallops, PMI not laterally displaced GI- soft, NT, ND, + BS Extremities- no clubbing, cyanosis, or edema Skin- no rash or lesion Psych- euthymic mood, full affect Neuro- strength and sensation are intact  LABS: Basic Metabolic Panel:  Recent Labs  04/21/16 1614 04/22/16 0554  NA 135 139  K 3.8 3.9  CL 106 104  CO2 23 22  GLUCOSE 137* 128*  BUN 16 14  CREATININE 1.13 1.04  CALCIUM 9.4 9.4    Liver Function Tests:  Recent Labs  04/21/16 1748  AST 40  ALT 20  ALKPHOS 43  BILITOT 0.2*  PROT 7.4  ALBUMIN 3.8   No results for input(s): LIPASE, AMYLASE in the last 72 hours. CBC:  Recent Labs  04/21/16 1614 04/22/16 0324  WBC 6.3 8.0  HGB 12.9* 11.6*  HCT 39.9 36.4*  MCV 71.5* 71.5*  PLT 154 122*   Cardiac Enzymes:  Recent Labs  04/21/16 1748 04/21/16 2212 04/22/16 0329  TROPONINI 1.17* 3.46* 5.57*   Fasting Lipid Panel:  Recent Labs  04/22/16 0309  CHOL 162  HDL 53  LDLCALC 95  TRIG 69  CHOLHDL 3.1   Thyroid Function Tests:  Recent Labs  04/21/16 1748  TSH 0.871   RADIOLOGY: Dg Chest 2 View  Result Date: 04/21/2016 CLINICAL DATA:  Central chest pain starting yesterday, nausea EXAM: CHEST  2 VIEW COMPARISON:  None. FINDINGS: Cardiomediastinal silhouette is unremarkable. Status post CABG. No acute infiltrate or pleural effusion. No pulmonary edema. Bony thorax is unremarkable. IMPRESSION: No active cardiopulmonary disease.  Electronically Signed   By: Lahoma Crocker M.D.   On: 04/21/2016 17:19    ASSESSMENT AND PLAN:  Active Problems:   NSTEMI (non-ST elevated myocardial infarction) ([place])  1. NSTEMI Pt with recent increasing burning chest pain.  Now with significant elevation in troponin.  I would advise cath with possible PCI.  Discussed the cath with the patient. The patient understands that risks included but are not limited to stroke (1 in 1000), death (1 in 87), kidney failure [usually temporary] (1 in 500), bleeding (1 in 200), allergic reaction [possibly serious] (1 in 200). The patient understands and agrees to proceed. Will schedule cath for tomorrow. Continue ASA, heparin drip and IV nitro.  Echo pending  2. CAD s/p CABG (grafts unknown- 5 grafts per spouse) -Will continue statin therapy, ACEi, and beta blocker  3. Hypertensive urgency Improved Continue current therapy  4. Diabetes -Continue outpatient meds.  - add  sliding scale  5. Difficulty sleeping Will add prn Teresa Coombs, MD 04/22/2016 11:49 AM

## 2016-04-22 NOTE — Progress Notes (Signed)
Cardiology Fellow updated with pt condition and repeat troponin level, orders to keep NPO for possible cath in am.

## 2016-04-22 NOTE — Progress Notes (Signed)
ANTICOAGULATION CONSULT NOTE - Follow Up Consult  Pharmacy Consult for Heparin  Indication: chest pain/ACS  No Known Allergies  Patient Measurements: Height: 6\' 1"  (185.4 cm) Weight: 189 lb 2 oz (85.8 kg) IBW/kg (Calculated) : 79.9  Vital Signs: Temp: 98.5 F (36.9 C) (08/20 1200) Temp Source: Oral (08/20 1200) BP: 124/68 (08/20 1200) Pulse Rate: 55 (08/20 1200)  Labs:  Recent Labs  04/21/16 1614  04/21/16 1748 04/21/16 2212 04/22/16 0052 04/22/16 0324 04/22/16 0329 04/22/16 0554 04/22/16 1240  HGB 12.9*  --   --   --   --  11.6*  --   --   --   HCT 39.9  --   --   --   --  36.4*  --   --   --   PLT 154  --   --   --   --  122*  --   --   --   APTT  --   --  31  --   --   --   --   --   --   LABPROT  --   --  14.0  --   --  14.8  --   --   --   INR  --   --  1.07  --   --  1.16  --   --   --   HEPARINUNFRC  --   --   --   --  0.69  --  0.66  --  0.83*  CREATININE 1.13  --   --   --   --   --   --  1.04  --   TROPONINI  --   < > 1.17* 3.46*  --   --  5.57*  --  3.63*  < > = values in this interval not displayed.  Estimated Creatinine Clearance: 76.8 mL/min (by C-G formula based on SCr of 1.04 mg/dL).  Meds: Heparin gtt @ 1100 units/hr  Assessment: 70 yo m admitted with CP on 04/21/2016. Heparin gtt per pharmacy.   HL has trended up to SUPRAtherapeutic level at 0.83. CBC low with slight trend down but no significant bleeding reported.   Goal of Therapy:  Heparin level 0.3-0.7 units/ml Monitor platelets by anticoagulation protocol: Yes   Plan:  - Decrease heparin gtt to 900 units/hr - 6 hour HL - Daily HL/CBC - Monitor for s/s bleeding - LHC planned for Monday   Julita Ozbun K. Velva Harman, PharmD, BCPS, CPP Clinical Pharmacist Pager: [phone] Phone: [phone] 04/22/2016 2:00 PM

## 2016-04-22 NOTE — Progress Notes (Signed)
ANTICOAGULATION CONSULT NOTE - Follow Up Consult  Pharmacy Consult for Heparin  Indication: chest pain/ACS  No Known Allergies  Patient Measurements: Height: 6\' 1"  (185.4 cm) Weight: 189 lb 2 oz (85.8 kg) IBW/kg (Calculated) : 79.9  Vital Signs: Temp: 98.4 F (36.9 C) (08/20 0000) Temp Source: Oral (08/20 0000) BP: 126/68 (08/20 0000) Pulse Rate: 51 (08/20 0000)  Labs:  Recent Labs  04/21/16 1614 04/21/16 1748 04/21/16 2212 04/22/16 0052  HGB 12.9*  --   --   --   HCT 39.9  --   --   --   PLT 154  --   --   --   APTT  --  31  --   --   LABPROT  --  14.0  --   --   INR  --  1.07  --   --   HEPARINUNFRC  --   --   --  0.69  CREATININE 1.13  --   --   --   TROPONINI  --  1.17* 3.46*  --     Estimated Creatinine Clearance: 70.7 mL/min (by C-G formula based on SCr of 1.13 mg/dL).  Assessment: Heparin for CP, troponin is rising, initial heparin level is therapeutic  Goal of Therapy:  Heparin level 0.3-0.7 units/ml Monitor platelets by anticoagulation protocol: Yes   Plan:  -Cont heparin at 1100 units/hr -1200 HL  Brandon Vasquez 04/22/2016,1:37 AM

## 2016-04-23 ENCOUNTER — Inpatient Hospital Stay (HOSPITAL_COMMUNITY)
Admission: EM | Disposition: A | Discharge status: Home / Self Care | Payer: Self-pay | Source: Home / Self Care | Attending: Cardiology

## 2016-04-23 DIAGNOSIS — E1169 Type 2 diabetes mellitus with other specified complication: Secondary | ICD-10-CM | POA: Diagnosis present

## 2016-04-23 DIAGNOSIS — I10 Essential (primary) hypertension: Secondary | ICD-10-CM | POA: Diagnosis present

## 2016-04-23 DIAGNOSIS — Z951 Presence of aortocoronary bypass graft: Secondary | ICD-10-CM

## 2016-04-23 DIAGNOSIS — I119 Hypertensive heart disease without heart failure: Secondary | ICD-10-CM

## 2016-04-23 DIAGNOSIS — E119 Type 2 diabetes mellitus without complications: Secondary | ICD-10-CM

## 2016-04-23 DIAGNOSIS — E1121 Type 2 diabetes mellitus with diabetic nephropathy: Secondary | ICD-10-CM

## 2016-04-23 DIAGNOSIS — I25810 Atherosclerosis of coronary artery bypass graft(s) without angina pectoris: Secondary | ICD-10-CM

## 2016-04-23 DIAGNOSIS — I25110 Atherosclerotic heart disease of native coronary artery with unstable angina pectoris: Secondary | ICD-10-CM

## 2016-04-23 DIAGNOSIS — E785 Hyperlipidemia, unspecified: Secondary | ICD-10-CM

## 2016-04-23 HISTORY — PX: CARDIAC CATHETERIZATION: SHX172

## 2016-04-23 LAB — BASIC METABOLIC PANEL WITH GFR
Anion gap: 6 (ref 5–15)
BUN: 14 mg/dL (ref 6–20)
CO2: 24 mmol/L (ref 22–32)
Calcium: 9.2 mg/dL (ref 8.9–10.3)
Chloride: 106 mmol/L (ref 101–111)
Creatinine, Ser: 1.15 mg/dL (ref 0.61–1.24)
GFR calc Af Amer: >60 mL/min
GFR calc non Af Amer: >60 mL/min
Glucose, Bld: 93 mg/dL (ref 65–99)
Potassium: 4.1 mmol/L (ref 3.5–5.1)
Sodium: 136 mmol/L (ref 135–145)

## 2016-04-23 LAB — GLUCOSE, CAPILLARY
GLUCOSE-CAPILLARY: 82 mg/dL (ref 65–99)
Glucose-Capillary: 90 mg/dL (ref 65–99)
Glucose-Capillary: 86 mg/dL (ref 65–99)
Glucose-Capillary: 80 mg/dL (ref 65–99)

## 2016-04-23 LAB — HEPARIN LEVEL (UNFRACTIONATED): HEPARIN UNFRACTIONATED: 0.49 [IU]/mL (ref 0.30–0.70)

## 2016-04-23 LAB — CBC
HCT: 39.6 % (ref 39.0–52.0)
HEMOGLOBIN: 12.8 g/dL — AB (ref 13.0–17.0)
MCH: 23.2 pg — AB (ref 26.0–34.0)
MCHC: 32.3 g/dL (ref 30.0–36.0)
MCV: 71.7 fL — ABNORMAL LOW (ref 78.0–100.0)
PLATELETS: 131 10*3/uL — AB (ref 150–400)
RBC: 5.52 MIL/uL (ref 4.22–5.81)
RDW: 15.3 % (ref 11.5–15.5)
WBC: 5.9 10*3/uL (ref 4.0–10.5)

## 2016-04-23 LAB — POCT ACTIVATED CLOTTING TIME: Activated Clotting Time: 351 seconds

## 2016-04-23 SURGERY — LEFT HEART CATH AND CORS/GRAFTS ANGIOGRAPHY
Anesthesia: LOCAL

## 2016-04-23 MED ORDER — ACETAMINOPHEN 325 MG PO TABS: 650.0000 mg | ORAL_TABLET | ORAL | Status: DC | PRN

## 2016-04-23 MED ORDER — IOPAMIDOL (ISOVUE-370) INJECTION 76%
INTRAVENOUS | Status: AC
  Filled 2016-04-23: qty 100

## 2016-04-23 MED ORDER — IOPAMIDOL (ISOVUE-370) INJECTION 76%
INTRAVENOUS | Status: DC | PRN
  Administered 2016-04-23: 125 mL via INTRA_ARTERIAL

## 2016-04-23 MED ORDER — SODIUM CHLORIDE 0.9 % IV SOLN: 250.0000 mL | INTRAVENOUS | Status: DC | PRN

## 2016-04-23 MED ORDER — TICAGRELOR 90 MG PO TABS
ORAL_TABLET | ORAL | Status: DC | PRN
  Administered 2016-04-23: 180 mg via ORAL

## 2016-04-23 MED ORDER — BIVALIRUDIN 250 MG IV SOLR
INTRAVENOUS | Status: AC
  Filled 2016-04-23: qty 250

## 2016-04-23 MED ORDER — SODIUM CHLORIDE 0.9 % IV SOLN
INTRAVENOUS | Status: DC | PRN
  Administered 2016-04-23: 1.75 mg/kg/h via INTRAVENOUS

## 2016-04-23 MED ORDER — SODIUM CHLORIDE 0.9 % WEIGHT BASED INFUSION: 1.0000 mL/kg/h | INTRAVENOUS | Status: DC

## 2016-04-23 MED ORDER — SODIUM CHLORIDE 0.9 % WEIGHT BASED INFUSION
3.0000 mL/kg/h | INTRAVENOUS | Status: DC
  Administered 2016-04-23: 3 mL/kg/h via INTRAVENOUS

## 2016-04-23 MED ORDER — MIDAZOLAM HCL 2 MG/2ML IJ SOLN
INTRAMUSCULAR | Status: AC
  Filled 2016-04-23: qty 2

## 2016-04-23 MED ORDER — SODIUM CHLORIDE 0.9 % WEIGHT BASED INFUSION: 1.0000 mL/kg/h | INTRAVENOUS | Status: AC

## 2016-04-23 MED ORDER — IOPAMIDOL (ISOVUE-370) INJECTION 76%
INTRAVENOUS | Status: AC
  Filled 2016-04-23: qty 125

## 2016-04-23 MED ORDER — ATROPINE SULFATE 1 MG/10ML IJ SOSY
PREFILLED_SYRINGE | INTRAMUSCULAR | Status: AC
  Filled 2016-04-23: qty 10

## 2016-04-23 MED ORDER — ASPIRIN 81 MG PO CHEW: 81.0000 mg | CHEWABLE_TABLET | Freq: Every day | ORAL | Status: DC

## 2016-04-23 MED ORDER — LIDOCAINE HCL (PF) 1 % IJ SOLN
INTRAMUSCULAR | Status: DC | PRN
  Administered 2016-04-23: 14 mL

## 2016-04-23 MED ORDER — BIVALIRUDIN BOLUS VIA INFUSION - CUPID
INTRAVENOUS | Status: DC | PRN
  Administered 2016-04-23: 60.975 mg via INTRAVENOUS

## 2016-04-23 MED ORDER — ONDANSETRON HCL 4 MG/2ML IJ SOLN: 4.0000 mg | Freq: Four times a day (QID) | INTRAMUSCULAR | Status: DC | PRN

## 2016-04-23 MED ORDER — ADENOSINE (DIAGNOSTIC) FOR INTRACORONARY USE
INTRAVENOUS | Status: DC | PRN
  Administered 2016-04-23: 60 ug via INTRACORONARY

## 2016-04-23 MED ORDER — LIDOCAINE HCL (PF) 1 % IJ SOLN
INTRAMUSCULAR | Status: AC
  Filled 2016-04-23: qty 30

## 2016-04-23 MED ORDER — ANGIOPLASTY BOOK
Freq: Once | Status: DC
  Filled 2016-04-23: qty 1

## 2016-04-23 MED ORDER — SODIUM CHLORIDE 0.9% FLUSH: 3.0000 mL | Freq: Two times a day (BID) | INTRAVENOUS | Status: DC

## 2016-04-23 MED ORDER — SODIUM CHLORIDE 0.9% FLUSH
3.0000 mL | Freq: Two times a day (BID) | INTRAVENOUS | Status: DC
Start: 2016-04-23 — End: 2016-04-23
  Administered 2016-04-23: 3 mL via INTRAVENOUS

## 2016-04-23 MED ORDER — HEPARIN (PORCINE) IN NACL 2-0.9 UNIT/ML-% IJ SOLN
INTRAMUSCULAR | Status: AC
  Filled 2016-04-23: qty 1500

## 2016-04-23 MED ORDER — ADENOSINE 12 MG/4ML IV SOLN
INTRAVENOUS | Status: AC
  Filled 2016-04-23: qty 4

## 2016-04-23 MED ORDER — SODIUM CHLORIDE 0.9% FLUSH: 3.0000 mL | INTRAVENOUS | Status: DC | PRN

## 2016-04-23 MED ORDER — FENTANYL CITRATE (PF) 100 MCG/2ML IJ SOLN
INTRAMUSCULAR | Status: AC
  Filled 2016-04-23: qty 2

## 2016-04-23 MED ORDER — HYDRALAZINE HCL 20 MG/ML IJ SOLN
INTRAMUSCULAR | Status: DC | PRN
  Administered 2016-04-23: 10 mg via INTRAVENOUS

## 2016-04-23 MED ORDER — TICAGRELOR 90 MG PO TABS
90.0000 mg | ORAL_TABLET | Freq: Two times a day (BID) | ORAL | Status: DC
  Administered 2016-04-24 (×2): 90 mg via ORAL
  Filled 2016-04-23 (×2): qty 1

## 2016-04-23 MED ORDER — HEPARIN (PORCINE) IN NACL 2-0.9 UNIT/ML-% IJ SOLN
INTRAMUSCULAR | Status: DC | PRN
  Administered 2016-04-23: 1500 mL

## 2016-04-23 MED ORDER — FENTANYL CITRATE (PF) 100 MCG/2ML IJ SOLN
INTRAMUSCULAR | Status: DC | PRN
  Administered 2016-04-23 (×3): 25 ug via INTRAVENOUS

## 2016-04-23 MED ORDER — ATORVASTATIN CALCIUM 80 MG PO TABS
80.0000 mg | ORAL_TABLET | Freq: Every day | ORAL | Status: DC
  Filled 2016-04-23: qty 1

## 2016-04-23 MED ORDER — MIDAZOLAM HCL 2 MG/2ML IJ SOLN
INTRAMUSCULAR | Status: DC | PRN
  Administered 2016-04-23: 1 mg via INTRAVENOUS
  Administered 2016-04-23: 2 mg via INTRAVENOUS
  Administered 2016-04-23: 1 mg via INTRAVENOUS

## 2016-04-23 MED ORDER — TICAGRELOR 90 MG PO TABS
ORAL_TABLET | ORAL | Status: AC
  Filled 2016-04-23: qty 2

## 2016-04-23 MED ORDER — HYDRALAZINE HCL 20 MG/ML IJ SOLN
INTRAMUSCULAR | Status: AC
Start: 2016-04-23 — End: 2016-04-23
  Filled 2016-04-23: qty 1

## 2016-04-23 SURGICAL SUPPLY — 19 items
BALLN EMERGE MR 2.5X15 (BALLOONS) ×2
BALLOON EMERGE MR 2.5X15 (BALLOONS) ×1 IMPLANT
CATH INFINITI 5FR MULTPACK ANG (CATHETERS) ×2 IMPLANT
DEVICE SPIDERFX EMB PROT 5MM (WIRE) ×2 IMPLANT
GLIDESHEATH SLEND SS 6F .021 (SHEATH) IMPLANT
GUIDE CATH RUNWAY 6FR MP1 (CATHETERS) ×2 IMPLANT
KIT ENCORE 26 ADVANTAGE (KITS) ×2 IMPLANT
KIT HEART LEFT (KITS) ×2 IMPLANT
PACK CARDIAC CATHETERIZATION (CUSTOM PROCEDURE TRAY) ×2 IMPLANT
SHEATH PINNACLE 5F 10CM (SHEATH) ×2 IMPLANT
SHEATH PINNACLE 6F 10CM (SHEATH) ×2 IMPLANT
STENT SYNERGY DES 3.5X20 (Permanent Stent) ×2 IMPLANT
SYR MEDRAD MARK V 150ML (SYRINGE) ×2 IMPLANT
TRANSDUCER W/STOPCOCK (MISCELLANEOUS) ×2 IMPLANT
TUBING CIL FLEX 10 FLL-RA (TUBING) ×2 IMPLANT
VALVE GUARDIAN II ~~LOC~~ HEMO (MISCELLANEOUS) ×2 IMPLANT
WIRE ASAHI PROWATER 180CM (WIRE) ×2 IMPLANT
WIRE EMERALD 3MM-J .035X150CM (WIRE) ×2 IMPLANT
WIRE SAFE-T 1.5MM-J .035X260CM (WIRE) IMPLANT

## 2016-04-23 NOTE — Interval H&P Note (Signed)
Cath Lab Visit (complete for each Cath Lab visit)  Clinical Evaluation Leading to the Procedure:   ACS: Yes.    Non-ACS:    Anginal Classification: CCS IV  Anti-ischemic medical therapy: Minimal Therapy (1 class of medications)  Non-Invasive Test Results: No non-invasive testing performed  Prior CABG: Previous CABG      History and Physical Interval Note:  04/23/2016 3:12 PM  Costella Hatcher  has presented today for surgery, with the diagnosis of unstable angina  The various methods of treatment have been discussed with the patient and family. After consideration of risks, benefits and other options for treatment, the patient has consented to  Procedure(s): Left Heart Cath and Cors/Grafts Angiography (N/A) as a surgical intervention .  The patient's history has been reviewed, patient examined, no change in status, stable for surgery.  I have reviewed the patient's chart and labs.  Questions were answered to the patient's satisfaction.     Larae Grooms

## 2016-04-23 NOTE — Progress Notes (Signed)
   04/23/16 1400  Clinical Encounter Type  Visited With Patient  Visit Type Spiritual support  Referral From Nurse  Spiritual Encounters  Spiritual Needs Literature  Chaplain provided AD form and explanation to patient, told him to let nurse know when it was done so we could notarize it for him. Zenna Traister, Chaplain

## 2016-04-23 NOTE — H&P (View-Only) (Signed)
Patient Name: Brandon Vasquez Date of Encounter: 04/23/2016  Hospital Problem List     Principal Problem:   NSTEMI (non-ST elevated myocardial infarction) [facility]) Active Problems:   Coronary artery disease involving native coronary artery of native heart with unstable angina pectoris ([place])   Hx of CABG x 5: In 2001, in [place]   Hypertension, accelerated with heart disease, without CHF   Essential hypertension   Diabetes mellitus type II, controlled ([place])   Dyslipidemia, goal LDL below 70    Patient Summary     69 year old gentleman history of coronary disease teslas Versus 5 with unknown grafts performed in 2001. Also Zetia hypertension, diabetes mellitus, type II as well as hyperlipidemia who presented with substernal chest pain consistent with non-STEMI. He is ruled in positive troponins, but EKG did not show any acute injury pattern, therefore he is not taken to the cardiac catheterization lab. He was stabilized medically initially on nitroglycerin drip improvement of his blood pressure. He is now. Blood pressure in his chest pain-free.    Subjective   Feels fine this morning. No further chest pain. No breathing issues. Happy to have nitroglycerin off as of last night. Headache improved.  Inpatient Medications    . aspirin EC  81 mg Oral Daily  . atorvastatin  80 mg Oral q1800  . calcium carbonate  2 tablet Oral Q breakfast  . Chlorhexidine Gluconate Cloth  6 each Topical Q0600  . hydrochlorothiazide  12.5 mg Oral Daily  . insulin aspart  1-3 Units Subcutaneous Q4H  . lisinopril  20 mg Oral Daily  . metFORMIN  500 mg Oral Q breakfast  . metoprolol succinate  12.5 mg Oral Daily  .  morphine injection  2 mg Intravenous Once  . mupirocin ointment  1 application Nasal BID  . omega-3 acid ethyl esters  1 g Oral Daily  . sodium chloride flush  3 mL Intravenous Q12H  . zolpidem  5 mg Oral QHS    Vital Signs    Vitals:   04/23/16 0600 04/23/16 0700 04/23/16 0800 04/23/16 0900   BP: 110/69 110/75 100/65 113/76  Pulse: (!) 57 65 63 (!) 57  Resp: (!) 21 20 18  (!) 22  Temp:      TempSrc:      SpO2: 99% 99% 98% 99%  Weight: 179 lb 3.7 oz (81.3 kg)     Height:        Intake/Output Summary (Last 24 hours) at 04/23/16 1135 Last data filed at 04/23/16 0900  Gross per 24 hour  Intake           951.69 ml  Output              850 ml  Net           101.69 ml   Filed Weights   04/21/16 1604 04/21/16 2200 04/23/16 0600  Weight: 189 lb 2 oz (85.8 kg) 185 lb 6.5 oz (84.1 kg) 179 lb 3.7 oz (81.3 kg)    Physical Exam    GEN: Well nourished, well developed, in no acute distress.  HEENT: normal.  Neck: Supple, no JVD, carotid bruits, or masses. Cardiac:Borderline bradycardic but normal rhythm., no murmurs, rubs, or gallops. No clubbing, cyanosis, edema.  Radials/DP/PT 2+ and equal bilaterally.  Respiratory:  Respirations regular and unlabored, clear to auscultation bilaterally. GI: Soft, nontender, nondistended, BS + x 4. MS: no deformity or atrophy. Skin: warm and dry, no rash. Neuro:  Strength and sensation are intact. Psych:  Normal affect.  Labs    CBC  Recent Labs  04/22/16 0324 04/23/16 0557  WBC 8.0 5.9  HGB 11.6* 12.8*  HCT 36.4* 39.6  MCV 71.5* 71.7*  PLT 122* [id number]*   Basic Metabolic Panel  Recent Labs  04/22/16 0554 04/23/16 0557  NA 139 136  K 3.9 4.1  CL 104 106  CO2 22 24  GLUCOSE 128* 93  BUN 14 14  CREATININE 1.04 1.15  CALCIUM 9.4 9.2   Liver Function Tests  Recent Labs  04/21/16 1748  AST 40  ALT 20  ALKPHOS 43  BILITOT 0.2*  PROT 7.4  ALBUMIN 3.8   No results for input(s): LIPASE, AMYLASE in the last 72 hours. Cardiac Enzymes  Recent Labs  04/21/16 2212 04/22/16 0329 04/22/16 1240  TROPONINI 3.46* 5.57* 3.63*   BNP Invalid input(s): POCBNP D-Dimer No results for input(s): DDIMER in the last 72 hours. Hemoglobin A1C No results for input(s): HGBA1C in the last 72 hours. Fasting Lipid Panel  Recent  Labs  04/22/16 0309  CHOL 162  HDL 53  LDLCALC 95  TRIG 69  CHOLHDL 3.1   Thyroid Function Tests  Recent Labs  04/21/16 1748  TSH 0.871    Telemetry    NSR / [place] 55-65  ECG    Not checked today  Radiology    Dg Chest 2 View  Result Date: 04/21/2016 CLINICAL DATA:  Central chest pain starting yesterday, nausea EXAM: CHEST  2 VIEW COMPARISON:  None. FINDINGS: Cardiomediastinal silhouette is unremarkable. Status post CABG. No acute infiltrate or pleural effusion. No pulmonary edema. Bony thorax is unremarkable. IMPRESSION: No active cardiopulmonary disease. Electronically Signed   By: Lahoma Crocker M.D.   On: 04/21/2016 17:19    Assessment & Plan    Principal Problem:   NSTEMI (non-ST elevated myocardial infarction) ([place]) -- now pain-free. Not requiring any further nitroglycerin.   ** Coronary artery disease involving native coronary artery of native heart with unstable angina pectoris (HCC)   Hx of CABG x 5: In 2001, in [place] left heart catheterization with coronary trephine graft angiography and possible PCI today.  Continues to be on IV heparin, but now off IV nitroglycerin as he is now pain-free.  On high-dose statin, aspirin, ACE inhibitor and low-dose beta blocker. Unable to titrate beta blocker due to borderline bradycardia.   Active Problems:    Hypertension, accelerated with heart disease, without CHF /  Essential hypertension -- currently well controlled  Weaned off nitroglycerin. Now on lisinopril 20 mg with HCTZ and low-dose metoprolol 12.5 mg twice a day. Blood pressure is now stable. With improved blood pressure, his chest pain improved.    Diabetes mellitus type II, controlled ([place]) - low glucose levels have been stable. Was on metformin at home as well as here -- I have held for today.. Closely  Monitor; no need for sliding scale insulin yet.    Dyslipidemia, goal LDL below 70  On high-dose statin.  Also on Lovaza.  On IV heparin for  DVT prophylaxis. No GI issues requiring GI prophylaxis.   Signed, [name], M.D., M.S. Interventional Cardiologist   Pager # [phone] Phone # [phone] [address] [place], [place] [address]

## 2016-04-23 NOTE — Progress Notes (Signed)
Patient Name: Brandon Vasquez Date of Encounter: 04/23/2016  Hospital Problem List     Principal Problem:   NSTEMI (non-ST elevated myocardial infarction) [facility]) Active Problems:   Coronary artery disease involving native coronary artery of native heart with unstable angina pectoris ([place])   Hx of CABG x 5: In 2001, in [place]   Hypertension, accelerated with heart disease, without CHF   Essential hypertension   Diabetes mellitus type II, controlled ([place])   Dyslipidemia, goal LDL below 70    Patient Summary     70 year old gentleman history of coronary disease teslas Versus 5 with unknown grafts performed in 2001. Also Zetia hypertension, diabetes mellitus, type II as well as hyperlipidemia who presented with substernal chest pain consistent with non-STEMI. He is ruled in positive troponins, but EKG did not show any acute injury pattern, therefore he is not taken to the cardiac catheterization lab. He was stabilized medically initially on nitroglycerin drip improvement of his blood pressure. He is now. Blood pressure in his chest pain-free.    Subjective   Feels fine this morning. No further chest pain. No breathing issues. Happy to have nitroglycerin off as of last night. Headache improved.  Inpatient Medications    . aspirin EC  81 mg Oral Daily  . atorvastatin  80 mg Oral q1800  . calcium carbonate  2 tablet Oral Q breakfast  . Chlorhexidine Gluconate Cloth  6 each Topical Q0600  . hydrochlorothiazide  12.5 mg Oral Daily  . insulin aspart  1-3 Units Subcutaneous Q4H  . lisinopril  20 mg Oral Daily  . metFORMIN  500 mg Oral Q breakfast  . metoprolol succinate  12.5 mg Oral Daily  .  morphine injection  2 mg Intravenous Once  . mupirocin ointment  1 application Nasal BID  . omega-3 acid ethyl esters  1 g Oral Daily  . sodium chloride flush  3 mL Intravenous Q12H  . zolpidem  5 mg Oral QHS    Vital Signs    Vitals:   04/23/16 0600 04/23/16 0700 04/23/16 0800 04/23/16 0900   BP: 110/69 110/75 100/65 113/76  Pulse: (!) 57 65 63 (!) 57  Resp: (!) 21 20 18  (!) 22  Temp:      TempSrc:      SpO2: 99% 99% 98% 99%  Weight: 179 lb 3.7 oz (81.3 kg)     Height:        Intake/Output Summary (Last 24 hours) at 04/23/16 1135 Last data filed at 04/23/16 0900  Gross per 24 hour  Intake           951.69 ml  Output              850 ml  Net           101.69 ml   Filed Weights   04/21/16 1604 04/21/16 2200 04/23/16 0600  Weight: 189 lb 2 oz (85.8 kg) 185 lb 6.5 oz (84.1 kg) 179 lb 3.7 oz (81.3 kg)    Physical Exam    GEN: Well nourished, well developed, in no acute distress.  HEENT: normal.  Neck: Supple, no JVD, carotid bruits, or masses. Cardiac:Borderline bradycardic but normal rhythm., no murmurs, rubs, or gallops. No clubbing, cyanosis, edema.  Radials/DP/PT 2+ and equal bilaterally.  Respiratory:  Respirations regular and unlabored, clear to auscultation bilaterally. GI: Soft, nontender, nondistended, BS + x 4. MS: no deformity or atrophy. Skin: warm and dry, no rash. Neuro:  Strength and sensation are intact. Psych:  Normal affect.  Labs    CBC  Recent Labs  04/22/16 0324 04/23/16 0557  WBC 8.0 5.9  HGB 11.6* 12.8*  HCT 36.4* 39.6  MCV 71.5* 71.7*  PLT 122* [id number]*   Basic Metabolic Panel  Recent Labs  04/22/16 0554 04/23/16 0557  NA 139 136  K 3.9 4.1  CL 104 106  CO2 22 24  GLUCOSE 128* 93  BUN 14 14  CREATININE 1.04 1.15  CALCIUM 9.4 9.2   Liver Function Tests  Recent Labs  04/21/16 1748  AST 40  ALT 20  ALKPHOS 43  BILITOT 0.2*  PROT 7.4  ALBUMIN 3.8   No results for input(s): LIPASE, AMYLASE in the last 72 hours. Cardiac Enzymes  Recent Labs  04/21/16 2212 04/22/16 0329 04/22/16 1240  TROPONINI 3.46* 5.57* 3.63*   BNP Invalid input(s): POCBNP D-Dimer No results for input(s): DDIMER in the last 72 hours. Hemoglobin A1C No results for input(s): HGBA1C in the last 72 hours. Fasting Lipid Panel  Recent  Labs  04/22/16 0309  CHOL 162  HDL 53  LDLCALC 95  TRIG 69  CHOLHDL 3.1   Thyroid Function Tests  Recent Labs  04/21/16 1748  TSH 0.871    Telemetry    NSR / [place] Stanford 55-65  ECG    Not checked today  Radiology    Dg Chest 2 View  Result Date: 04/21/2016 CLINICAL DATA:  Central chest pain starting yesterday, nausea EXAM: CHEST  2 VIEW COMPARISON:  None. FINDINGS: Cardiomediastinal silhouette is unremarkable. Status post CABG. No acute infiltrate or pleural effusion. No pulmonary edema. Bony thorax is unremarkable. IMPRESSION: No active cardiopulmonary disease. Electronically Signed   By: Lahoma Crocker M.D.   On: 04/21/2016 17:19    Assessment & Plan    Principal Problem:   NSTEMI (non-ST elevated myocardial infarction) ([place]) -- now pain-free. Not requiring any further nitroglycerin.   ** Coronary artery disease involving native coronary artery of native heart with unstable angina pectoris (HCC)   Hx of CABG x 5: In 2001, in [place] left heart catheterization with coronary trephine graft angiography and possible PCI today.  Continues to be on IV heparin, but now off IV nitroglycerin as he is now pain-free.  On high-dose statin, aspirin, ACE inhibitor and low-dose beta blocker. Unable to titrate beta blocker due to borderline bradycardia.   Active Problems:    Hypertension, accelerated with heart disease, without CHF /  Essential hypertension -- currently well controlled  Weaned off nitroglycerin. Now on lisinopril 20 mg with HCTZ and low-dose metoprolol 12.5 mg twice a day. Blood pressure is now stable. With improved blood pressure, his chest pain improved.    Diabetes mellitus type II, controlled ([place]) - low glucose levels have been stable. Was on metformin at home as well as here -- I have held for today.. Closely  Monitor; no need for sliding scale insulin yet.    Dyslipidemia, goal LDL below 70  On high-dose statin.  Also on Lovaza.  On IV heparin for  DVT prophylaxis. No GI issues requiring GI prophylaxis.   Signed, [name], M.D., M.S. Interventional Cardiologist   Pager # [phone] Phone # [phone] [address] [place], [place] [address]

## 2016-04-23 NOTE — Progress Notes (Signed)
ANTICOAGULATION CONSULT NOTE - Follow Up Consult  Pharmacy Consult for Heparin  Indication: chest pain/ACS  No Known Allergies  Patient Measurements: Height: 6\' 1"  (185.4 cm) Weight: 179 lb 3.7 oz (81.3 kg) IBW/kg (Calculated) : 79.9  Vital Signs: BP: 110/75 (08/21 0700) Pulse Rate: 65 (08/21 0700)  Labs:  Recent Labs  04/21/16 1614  04/21/16 1748 04/21/16 2212  04/22/16 0324 04/22/16 0329 04/22/16 0554 04/22/16 1240 04/22/16 1939 04/23/16 0557  HGB 12.9*  --   --   --   --  11.6*  --   --   --   --  12.8*  HCT 39.9  --   --   --   --  36.4*  --   --   --   --  39.6  PLT 154  --   --   --   --  122*  --   --   --   --  131*  APTT  --   --  31  --   --   --   --   --   --   --   --   LABPROT  --   --  14.0  --   --  14.8  --   --   --   --   --   INR  --   --  1.07  --   --  1.16  --   --   --   --   --   HEPARINUNFRC  --   --   --   --   < >  --  0.66  --  0.83* 0.48 0.49  CREATININE 1.13  --   --   --   --   --   --  1.04  --   --  1.15  TROPONINI  --   < > 1.17* 3.46*  --   --  5.57*  --  3.63*  --   --   < > = values in this interval not displayed.  Estimated Creatinine Clearance: 69.5 mL/min (by C-G formula based on SCr of 1.15 mg/dL).  Meds: Heparin gtt @ 900 units/hr  Assessment: 71 yo m admitted with CP on 04/21/2016 continues on IV heparin. HL therapeutic*2 and will transition to daily HL at this time. Hgb 12.8 and PLT 131  Goal of Therapy:  Heparin level 0.3-0.7 units/ml Monitor platelets by anticoagulation protocol: Yes   Plan:  - Continue heparin gtt at 900 units/hr - F/u daily heparin level and CBC  Melburn Popper, PharmD Clinical Pharmacy Resident Pager: [phone] 04/23/16 7:50 AM

## 2016-04-24 ENCOUNTER — Telehealth: Payer: Self-pay | Admitting: Cardiology

## 2016-04-24 ENCOUNTER — Encounter (HOSPITAL_COMMUNITY): Payer: Self-pay | Admitting: Interventional Cardiology

## 2016-04-24 DIAGNOSIS — E1122 Type 2 diabetes mellitus with diabetic chronic kidney disease: Secondary | ICD-10-CM

## 2016-04-24 LAB — BASIC METABOLIC PANEL
Anion gap: 9 (ref 5–15)
BUN: 15 mg/dL (ref 6–20)
CALCIUM: 9.2 mg/dL (ref 8.9–10.3)
CO2: 21 mmol/L — ABNORMAL LOW (ref 22–32)
Chloride: 107 mmol/L (ref 101–111)
Creatinine, Ser: 1.18 mg/dL (ref 0.61–1.24)
GFR calc Af Amer: >60 mL/min (ref >60)
GLUCOSE: 84 mg/dL (ref 65–99)
Potassium: 4 mmol/L (ref 3.5–5.1)
Sodium: 137 mmol/L (ref 135–145)

## 2016-04-24 LAB — CBC
HCT: 38.2 % — ABNORMAL LOW (ref 39.0–52.0)
Hemoglobin: 12.2 g/dL — ABNORMAL LOW (ref 13.0–17.0)
MCH: 22.9 pg — ABNORMAL LOW (ref 26.0–34.0)
MCHC: 31.9 g/dL (ref 30.0–36.0)
MCV: 71.7 fL — ABNORMAL LOW (ref 78.0–100.0)
PLATELETS: 151 10*3/uL (ref 150–400)
RBC: 5.33 MIL/uL (ref 4.22–5.81)
RDW: 15.2 % (ref 11.5–15.5)
WBC: 5.4 10*3/uL (ref 4.0–10.5)

## 2016-04-24 LAB — GLUCOSE, CAPILLARY
Glucose-Capillary: 87 mg/dL (ref 65–99)
Glucose-Capillary: 106 mg/dL — ABNORMAL HIGH (ref 65–99)

## 2016-04-24 MED ORDER — TICAGRELOR 90 MG PO TABS: 90.0000 mg | ORAL_TABLET | Freq: Two times a day (BID) | ORAL | 3 refills | Status: DC

## 2016-04-24 MED ORDER — PRAVASTATIN SODIUM 80 MG PO TABS: 80.0000 mg | ORAL_TABLET | Freq: Every day | ORAL | 3 refills | Status: DC

## 2016-04-24 MED ORDER — NITROGLYCERIN 0.4 MG SL SUBL: 0.4000 mg | SUBLINGUAL_TABLET | SUBLINGUAL | 12 refills | Status: DC | PRN

## 2016-04-24 MED ORDER — HEART ATTACK BOUNCING BOOK
Freq: Once | Status: AC
Start: 2016-04-24 — End: 2016-04-24
  Administered 2016-04-24: 03:00:00
  Filled 2016-04-24: qty 1

## 2016-04-24 NOTE — Progress Notes (Signed)
Patient Name: Brandon Vasquez Date of Encounter: 04/24/2016  Hospital Problem List     Principal Problem:   NSTEMI (non-ST elevated myocardial infarction) [facility]) Active Problems:   Native Vessel CAD, s/p CABG; Ostial LCx 90% lesion   Hx of CABG x 5: In 2001, in [place]   Hypertension, accelerated with heart disease, without CHF   CAD of SVG-RCA; s//p DES PCI   Presence of drug coated stent in SVG-right coronary artery   Essential hypertension   Diabetes mellitus type II, controlled ([place])   Dyslipidemia, goal LDL below 70    Patient Summary     70 year old gentleman history of coronary disease teslas Versus 5 with unknown grafts performed in 2001. Also Zetia hypertension, diabetes mellitus, type II as well as hyperlipidemia who presented with substernal chest pain consistent with non-STEMI. He is ruled in positive troponins, but EKG did not show any acute injury pattern, therefore he is not taken to the cardiac catheterization lab. He was stabilized medically initially on nitroglycerin drip improvement of his blood pressure. He is now. Blood pressure in his chest pain-free.   Cardiac catheterization on 04/23/2016. PCI to SVG-RCA. Also noted ostial circumflex with the distal circumflex not being grafted. Consider possible staged PCI if symptoms warrant.  Subjective   NO further CP @ rest or ambulation. No issues with groin access site  Inpatient Medications    . angioplasty book   Does not apply Once  . aspirin EC  81 mg Oral Daily  . atorvastatin  80 mg Oral q1800  . atropine      . calcium carbonate  2 tablet Oral Q breakfast  . Chlorhexidine Gluconate Cloth  6 each Topical Q0600  . hydrochlorothiazide  12.5 mg Oral Daily  . insulin aspart  1-3 Units Subcutaneous Q4H  . lisinopril  20 mg Oral Daily  . metoprolol succinate  12.5 mg Oral Daily  .  morphine injection  2 mg Intravenous Once  . mupirocin ointment  1 application Nasal BID  . omega-3 acid ethyl esters  1 g Oral  Daily  . sodium chloride flush  3 mL Intravenous Q12H  . ticagrelor  90 mg Oral BID  . zolpidem  5 mg Oral QHS    Vital Signs    Vitals:   04/23/16 2215 04/23/16 2230 04/23/16 2300 04/24/16 0431  BP: (!) 142/81 (!) 127/109 139/90 131/66  Pulse: 67 62 63 70  Resp: (!) 23 (!) 26 16 14   Temp:    97.4 F (36.3 C)  TempSrc:    Oral  SpO2: 99% 98% 100% 97%  Weight:    180 lb 12.4 oz (82 kg)  Height:        Intake/Output Summary (Last 24 hours) at 04/24/16 0737 Last data filed at 04/24/16 0435  Gross per 24 hour  Intake          1318.21 ml  Output             2575 ml  Net         -1256.79 ml   Filed Weights   04/21/16 2200 04/23/16 0600 04/24/16 0431  Weight: 185 lb 6.5 oz (84.1 kg) 179 lb 3.7 oz (81.3 kg) 180 lb 12.4 oz (82 kg)    Physical Exam    GEN: Well nourished, well developed, in no acute distress.  HEENT: normal.  Neck: Supple, no JVD, carotid bruits, or masses. Cardiac:Borderline bradycardic but normal rhythm., no murmurs, rubs, or gallops. No clubbing, cyanosis, edema.  Radials/DP/PT 2+ and equal bilaterally.  Respiratory:  Respirations regular and unlabored, clear to auscultation bilaterally. GI: Soft, nontender, nondistended, BS + x 4. MS: no deformity or atrophy. Skin: warm and dry, no rash.  R groin C/C/I no hematoma Neuro:  Strength and sensation are intact. Psych: Normal affect.  Labs    CBC  Recent Labs  04/23/16 0557 04/24/16 0328  WBC 5.9 5.4  HGB 12.8* 12.2*  HCT 39.6 38.2*  MCV 71.7* 71.7*  PLT 131* [id number]   Basic Metabolic Panel  Recent Labs  04/23/16 0557 04/24/16 0328  NA 136 137  K 4.1 4.0  CL 106 107  CO2 24 21*  GLUCOSE 93 84  BUN 14 15  CREATININE 1.15 1.18  CALCIUM 9.2 9.2   Liver Function Tests  Recent Labs  04/21/16 1748  AST 40  ALT 20  ALKPHOS 43  BILITOT 0.2*  PROT 7.4  ALBUMIN 3.8   No results for input(s): LIPASE, AMYLASE in the last 72 hours. Cardiac Enzymes  Recent Labs  04/21/16 2212  04/22/16 0329 04/22/16 1240  TROPONINI 3.46* 5.57* 3.63*   BNP Invalid input(s): POCBNP D-Dimer No results for input(s): DDIMER in the last 72 hours. Hemoglobin A1C No results for input(s): HGBA1C in the last 72 hours. Fasting Lipid Panel  Recent Labs  04/22/16 0309  CHOL 162  HDL 53  LDLCALC 95  TRIG 69  CHOLHDL 3.1   Thyroid Function Tests  Recent Labs  04/21/16 1748  TSH 0.871    Telemetry    NSR / [place] 55-65  ECG    Not checked today  Cardiology    Procedures : 04/23/16 Coronary Stent Intervention  Left Heart Cath and Cors/Grafts Angiography  Conclusion    SVG to PDA Origin lesion, 90 %stenosed.A STENT SYNERGY DES 3.5X20 drug eluting stent was successfully placed and dilated to 4 mm.  Post intervention, there is a 0% residual stenosis.Prox RCA lesion, 80 %stenosed. Dist RCA diffuse lesion, 50 %stenosed. Native   Left main into Ost Cx lesion, 80 %stenosed, not bypassed.  Prox LAD lesion, 100 %stenosed. Ost LAD to Prox LAD lesion, 75 %stenosed. LIMA to LAD is patent.  Ost 1st Mrg to 1st Mrg lesion, 100 %stenosed. Ost 2nd Mrg to 2nd Mrg lesion, 100 %stenosed. SVG to jump graft from OM1 to OM2 is widely patent. Graft  Patent LIMA-LAD & SVG-OM1-2  The left ventricular systolic function is normal.  The left ventricular ejection fraction is 55-65% by visual estimate.  LV end diastolic pressure is normal.  Continue dual antiplatelet therapy for at least a year.  Consider PCI of the left main into the circumflex.     Assessment & Plan    Principal Problem:   NSTEMI (non-ST elevated myocardial infarction) ([place]) -- now pain-free. Not requiring any further nitroglycerin.   ** Coronary artery disease involving native coronary artery of native heart with unstable angina pectoris (HCC)   Hx of CABG x 5: In 2001, in [place]  Left heart catheterization yesterday revealing likely culprit lesion in the SVG-RCA treated with DES PCI. Also has severe ostial  native circumflex with the distal circumflex not being protected by grafts due to occlusion of both OM branches.  --> This was not thought to be the culprit lesion. Could consider PCI if symptoms warrant.  On aspirin plus Brilinta. With possible staged PCI would continue both for minimum one year.  On high-dose statin, aspirin, ACE inhibitor and low-dose beta blocker. Unable to titrate beta blocker  due to borderline bradycardia.    Hypertension, accelerated with heart disease, without CHF /  Essential hypertension -- With improved blood pressure, his chest pain improved.  Currently well controlled  Weaned off nitroglycerin.   Now on lisinopril 20 mg with HCTZ and low-dose metoprolol 12.5 mg twice a day. Blood pressure is now stable.     Diabetes mellitus type II, controlled ([place]) - low glucose levels have been stable. Was on metformin at home as well as here -- I have held for today.. Closely  Monitor; no need for sliding scale insulin yet.    Dyslipidemia, goal LDL below 70  On high-dose statin.  Also on Lovaza.  Plan for now: discharge home after CM & [place] evaluation. Will reassess for exertional angina Sx on medical Rx as OP -- if recurrent angina, would consider staged PCI of LM-Cx for the ungrafted OM3.    Signed, [name], M.D., M.S. Interventional Cardiologist   Pager # [phone] Phone # [phone] [address] [place], [place] [address]

## 2016-04-24 NOTE — Progress Notes (Signed)
CARDIAC REHAB PHASE I   PRE:  Rate/Rhythm: 29 SR  BP:  Supine:   Sitting: 117/77  Standing:    SaO2:   MODE:  Ambulation: 800 ft   POST:  Rate/Rhythm: 77 SR  BP:  Supine:   Sitting: 168/78  Standing:    SaO2:  0900-1010 Pt walked 800 ft with steady gait. No CP. Tolerated well. MI education completed with pt who voiced understanding. Stressed importance of brilinta with stent. Needs to see case manager. Reviewed NTG use, risk factors, MI restriction, ex ed, carb counting and heart healthy food choices. Pt voiced understanding. Discussed CRP 2 and will refer to [facility] program.   Graylon Good, RN BSN  04/24/2016 10:08 AM

## 2016-04-24 NOTE — Telephone Encounter (Signed)
New Message  TOC appt with Almyra Deforest on 9.6, @ 9:30 am from [employer].

## 2016-04-24 NOTE — Telephone Encounter (Signed)
Patient still admitted as of 04/24/16.

## 2016-04-24 NOTE — Discharge Summary (Signed)
Discharge Summary    Patient ID: Brandon Vasquez,  MRN: [id number], DOB/AGE: [birth year] 70 y.o.  Admit date: 04/21/2016 Discharge date: 04/24/2016  Primary Care Provider: No PCP Per Patient Primary Cardiologist: Dr. Ellyn Hack   Discharge Diagnoses    Principal Problem:   NSTEMI (non-ST elevated myocardial infarction) [facility]) Active Problems:   Essential hypertension   Diabetes mellitus type II, controlled ([place])   Dyslipidemia, goal LDL below 70   CAD (coronary artery disease)   Allergies No Known Allergies   History of Present Illness     Brandon Vasquez is a 70 y.o. male with a history of CAD s/p CABG x5V (2001 in [place]), HTN, HLD, and DMT2 who presented to [facility] on 04/21/16 with chest pain and ruled in for NSTEMI.   He presented with chest pain described as a burning sensation that was reminiscent of symptoms prior to bypass surgery. In the ER, initial troponin was positive at 1.17. He was also found to be hypertensive with SBP in 200s. He was started on heparin and nitro gtt.    Hospital Course     Consultants: none   CAD/NSTEMI: His troponin further increased to 5.57 and he was set up for coronary angiography. Left heart catheterization on 04/23/16 revealed likely culprit lesion in the SVG-RCA treated with DES PCI. He also had severe ostial native circumflex with the distal circumflex not being protected by grafts due to occlusion of both OM branches. However, this was not thought to be the culprit lesion and could be considered later for PCI if symptoms warrant. 2D ECHO showed normal LV function with possible HK of inferior myocardium. He was placed on aspirin plus Brilinta for at least 1 year. On high-dose statin, ACE inhibitor and low-dose beta blocker. Unable to titrate beta blocker due to borderline bradycardia.  Hypertension: accelerated with heart disease. His chest pain improved with better BP control. He was weaned off nitroglycerin gtt. Now stable on lisinopril 20 mg  with HCTZ 12.5mg  daily and low-dose metoprolol XL 12.5 mg twice a day.  Diabetes mellitus type II: resume home meds but Metfomin held for 48 hours post cardiac cath.   Dyslipidemia: LDL was 95. Goal LDL below 70. Will increase pravastatin from 40mg  to 80mg  daily. Continue Lovaza.   The patient has had an uncomplicated hospital course and is recovering well. The radial catheter site is stable. He has been seen by Dr. Ellyn Hack today and deemed ready for discharge home. All follow-up appointments have been scheduled. A written RX for a 30 day free supply of Brilinta was provided for the patient. Discharge medications are listed below.  _____________  Discharge Vitals Blood pressure (!) 146/70, pulse 65, temperature 97.6 F (36.4 C), temperature source Oral, resp. rate 18, height 6\' 1"  (1.854 m), weight 180 lb 12.4 oz (82 kg), SpO2 100 %.  Filed Weights   04/21/16 2200 04/23/16 0600 04/24/16 0431  Weight: 185 lb 6.5 oz (84.1 kg) 179 lb 3.7 oz (81.3 kg) 180 lb 12.4 oz (82 kg)    Labs & Radiologic Studies     CBC  Recent Labs  04/23/16 0557 04/24/16 0328  WBC 5.9 5.4  HGB 12.8* 12.2*  HCT 39.6 38.2*  MCV 71.7* 71.7*  PLT 131* [id number]   Basic Metabolic Panel  Recent Labs  04/23/16 0557 04/24/16 0328  NA 136 137  K 4.1 4.0  CL 106 107  CO2 24 21*  GLUCOSE 93 84  BUN 14 15  CREATININE 1.15 1.18  CALCIUM 9.2 9.2   Liver Function Tests  Recent Labs  04/21/16 1748  AST 40  ALT 20  ALKPHOS 43  BILITOT 0.2*  PROT 7.4  ALBUMIN 3.8   No results for input(s): LIPASE, AMYLASE in the last 72 hours. Cardiac Enzymes  Recent Labs  04/21/16 2212 04/22/16 0329 04/22/16 1240  TROPONINI 3.46* 5.57* 3.63*   Fasting Lipid Panel  Recent Labs  04/22/16 0309  CHOL 162  HDL 53  LDLCALC 95  TRIG 69  CHOLHDL 3.1   Thyroid Function Tests  Recent Labs  04/21/16 1748  TSH 0.871    Dg Chest 2 View  Result Date: 04/21/2016 CLINICAL DATA:  Central chest pain starting  yesterday, nausea EXAM: CHEST  2 VIEW COMPARISON:  None. FINDINGS: Cardiomediastinal silhouette is unremarkable. Status post CABG. No acute infiltrate or pleural effusion. No pulmonary edema. Bony thorax is unremarkable. IMPRESSION: No active cardiopulmonary disease. Electronically Signed   By: Lahoma Crocker M.D.   On: 04/21/2016 17:19     Diagnostic Studies/Procedures    Procedures : 04/23/16 Coronary Stent Intervention  Left Heart Cath and Cors/Grafts Angiography  Conclusion    SVG to PDA Origin lesion, 90 %stenosed.A STENT SYNERGY DES 3.5X20 drug eluting stent was successfully placed and dilated to 4 mm.  Post intervention, there is a 0% residual stenosis.Prox RCA lesion, 80 %stenosed. Dist RCA diffuse lesion, 50 %stenosed. Native   Left main into Ost Cx lesion, 80 %stenosed, not bypassed.  Prox LAD lesion, 100 %stenosed. Ost LAD to Prox LAD lesion, 75 %stenosed. LIMA to LAD is patent.  Ost 1st Mrg to 1st Mrg lesion, 100 %stenosed. Ost 2nd Mrg to 2nd Mrg lesion, 100 %stenosed. SVG to jump graft from OM1 to OM2 is widely patent. Graft  Patent LIMA-LAD & SVG-OM1-2  The left ventricular systolic function is normal.  The left ventricular ejection fraction is 55-65% by visual estimate.  LV end diastolic pressure is normal.  Continue dual antiplatelet therapy for at least a year. Consider PCI of the left main into the circumflex.    _____________  2D ECHO: 04/22/2016 LV EF: 60% -   65% Study Conclusions - Left ventricle: The cavity size was normal. There was moderate   concentric hypertrophy. Systolic function was normal. The   estimated ejection fraction was in the range of 60% to 65%.   Possible mild hypokinesis of the inferior myocardium. Features   are consistent with a pseudonormal left ventricular filling   pattern, with concomitant abnormal relaxation and increased   filling pressure (grade 2 diastolic dysfunction).    Disposition   Pt is being discharged home  today in good condition.  Follow-up Plans & Appointments    Follow-up Information    Almyra Deforest, [place] Follow up on 05/09/2016.   Specialties:  Cardiology, Radiology Why:  @ 9:30am  Contact information: [address] [place] [address] [phone]          Discharge Instructions    Amb Referral to Cardiac Rehabilitation    Complete by:  As directed   Referring to [facility] Phase 2   Diagnosis:   NSTEMI Coronary Stents        Discharge Medications     Medication List    TAKE these medications   aspirin 81 MG tablet Take 1 tablet by mouth daily. Notes to patient:  Prevents clotting in stent and heart attack   CALCIUM-CARB 600 600 MG Tabs tablet Generic drug:  calcium carbonate Take 2 tablets  by mouth daily. Notes to patient:  Supplement    Fish Oil 1000 MG Caps Take 2 capsules by mouth daily. Notes to patient:  Supplement    hydrochlorothiazide 12.5 MG tablet Commonly known as:  HYDRODIURIL Take 1 tablet by mouth daily. Notes to patient:  Blood pressure and weak diuretic   lisinopril 20 MG tablet Commonly known as:  PRINIVIL,ZESTRIL Take 1 tablet by mouth daily. Notes to patient:  Blood pressure   metFORMIN 500 MG 24 hr tablet Commonly known as:  GLUCOPHAGE-XR Take 1 tablet by mouth daily. Notes to patient:  Hold for 48 hours after cath DO NOT TAKE TODAY OR TOMORROW MORNING   metoprolol succinate 25 MG 24 hr tablet Commonly known as:  TOPROL-XL Take 0.5 tablets by mouth daily. Notes to patient:  Decreases work of the heart  Decreases heart rate and blood pressure Decreases force of contraction of heart muscle   nitroGLYCERIN 0.4 MG SL tablet Commonly known as:  NITROSTAT Place 1 tablet (0.4 mg total) under the tongue every 5 (five) minutes x 3 doses as needed for chest pain.   pravastatin 80 MG tablet Commonly known as:  PRAVACHOL Take 1 tablet (80 mg total) by mouth daily. What changed:  medication strength Notes to patient:   Cholesterol    ticagrelor 90 MG Tabs tablet Commonly known as:  BRILINTA Take 1 tablet (90 mg total) by mouth 2 (two) times daily. Notes to patient:  Prevents clotting in the stent and heart attack       Aspirin prescribed at discharge? Yes High Intensity Statin Prescribed? Yes Beta Blocker Prescribed? Yes For EF 45% or less, Was ACEI/ARB Prescribed? Yes, but EF okay.  ADP Receptor Inhibitor Prescribed? Yes For EF <45%, Aldosterone Inhibitor Prescribed? No, EF normal Was EF assessed during THIS hospitalization? Yes Was Cardiac Rehab II ordered? (Included Medically managed Patients): Yes   Outstanding Labs/Studies  None.  Duration of Discharge Encounter   Greater than 30 minutes including physician time.  Signed, Angelena Form PA-C 04/24/2016, 1:55 PM

## 2016-04-24 NOTE — Care Management Note (Addendum)
Case Management Note  Patient Details  Name: Brandon Vasquez MRN: [id number] Date of Birth: Sep 20, 1945  Subjective/Objective:   Patient is s/p coronary stent intervention, will be on brilinta,.  PTA indep, patient for dc today. NCM gave patient 30 day savings card, [employer] has about 12 in stock and will be able to order more.   Per benefit check    S/W DOROTHY @ [employer] RX # [phone]   BRILINTA 90 MG BID (30 )   COVER- YES  CO-PAY- $ 20.00   60 TAB  MAIL -ORDER FOR 90 DAY SUPPLY $ 40.00  TIER- 2 DRUG  PRIOR APPROVAL - NO  PHARMACY : [facility]                 Action/Plan:   Expected Discharge Date:                  Expected Discharge Plan:  Home/Self Care  In-House Referral:     Discharge planning Services  CM Consult  Post Acute Care Choice:    Choice offered to:     DME Arranged:    DME Agency:     HH Arranged:    HH Agency:     Status of Service:  In process, will continue to follow  If discussed at Long Length of Stay Meetings, dates discussed:    Additional Comments:  Zenon Mayo, RN 04/24/2016, 10:36 AM

## 2016-04-25 NOTE — Telephone Encounter (Signed)
Unable to reach pt or leave a message  

## 2016-04-26 MED FILL — Adenosine IV Soln 12 MG/4ML: INTRAVENOUS | Qty: 4 | Status: AC

## 2016-04-26 NOTE — Telephone Encounter (Signed)
Patient contacted regarding discharge from [place] on 04/24/16  Patient understands to follow up with provider meng on 05/09/16 at 9:30 am at [facility]. Patient understands discharge instructions?yes Patient understands medications and regiment? yes Patient understands to bring all medications to this visit? yes

## 2016-05-09 ENCOUNTER — Encounter: Payer: Medicare Other | Admitting: Physician Assistant

## 2016-05-09 ENCOUNTER — Encounter: Payer: Self-pay | Admitting: Physician Assistant

## 2016-05-09 VITALS — BP 175/94 | HR 66 | Ht 73.0 in | Wt 183.4 lb

## 2016-05-09 DIAGNOSIS — I10 Essential (primary) hypertension: Secondary | ICD-10-CM

## 2016-05-09 DIAGNOSIS — E119 Type 2 diabetes mellitus without complications: Secondary | ICD-10-CM

## 2016-05-09 DIAGNOSIS — E785 Hyperlipidemia, unspecified: Secondary | ICD-10-CM

## 2016-05-09 DIAGNOSIS — I25810 Atherosclerosis of coronary artery bypass graft(s) without angina pectoris: Secondary | ICD-10-CM

## 2016-05-09 MED ORDER — EZETIMIBE 10 MG PO TABS: 10.0000 mg | ORAL_TABLET | Freq: Every day | ORAL | 11 refills | Status: DC

## 2016-05-09 MED ORDER — LISINOPRIL 40 MG PO TABS: 40.0000 mg | ORAL_TABLET | Freq: Every day | ORAL | 11 refills | Status: DC

## 2016-05-09 NOTE — Progress Notes (Signed)
Cardiology Office Note    Date:  05/09/2016   ID:  Brandon Vasquez, DOB 1945/12/30, MRN [id number]  PCP:  Dr. Junie Panning at [facility] Cardiologist:  [place]: Dr. Brayton Layman at [facility] MD.    [place]: Dr. Ellyn Hack   Chief Complaint  Patient presents with  . Hospitalization Follow-up    seen for Dr. Ellyn Hack    History of Present Illness:  Brandon Vasquez is a 70 y.o. male with PMH of HTN, HLD, DM and CAD s/p 5v CABG recently presented to the hospital on 04/21/2016 for recurrent chest pain. Per patient, he had similar chest discomfort prior to his bypass surgery in 2001 in [place]. He did have a cardiac arrest in the cath lab prior to the bypass surgery. He was ruled in for NSTEMI with troponin peaked at 5.57. Echocardiogram obtained on 04/22/2016 showed EF 123456, grade 2 diastolic dysfunction, moderate LVH. He underwent cardiac catheterization on 04/23/2016 which showed 80% proximal RCA lesion, 80% left main into ost left circumflex lesion which was not bypassed, 100% proximal LAD occlusion with 70% proximal LAD lesion, patent LIMA to LAD, jump graft SVG to OM1/OM2 was widely patent, 90% SVG to PDA lesion treated with 3.5 x 20 mm Synergy DES postdilated to 4 mm, EF 55-65%. Since the ostial left circumflex lesion was not grafted, we may consider possible staged PCI later he symptom recur. Post procedure, patient was placed on aspirin and Brilinta. Fasting lipid panel obtained during the hospitalization showed cholesterol 162, triglycerides 69, HDL 53, LDL 95. He was placed on high-dose statin. Beta blocker was unable to be titrated due to borderline bradycardia.  Patient is added back onto my schedule after I initially placed no charge in his chart and deleted my original note. He arrived 45 minutes late to the appointment..  Since his discharge, he has not experienced the same burning type of chest pain he had before. We have reviewed his previous film, we have also discussed signs and  symptoms of angina. Stent he will need to discuss with [place] if has recurrent angina symptom. He has been eating more fish and cutting back on meat consumption. He and his wife moved down to [place] 3 years ago, however he does have a primary care physician and an cardiologist in [place] which he occasionally go up to see. His blood pressure continued to be high with systolic blood pressure in the 170s. We will increase his lisinopril to 40 mg daily. He will come back in 2 weeks to follow-up in the hypertension clinic. We will check a basic metabolic panel in one week to assess potassium and renal function. Ideally given history of diabetes, his LDL should be less than 70, recent labs obtained on 04/22/2016 shows LDL 95. I have added 10 mg Zetia to his medical regimen.    Past Medical History:  Diagnosis Date  . CAD (coronary artery disease)    a. 2005: CABG x5V in [place]  b. 04/2016: PCI/DES to SVG--> RCA. He also had severe ostial native circumflex with the distal circumflex not being protected by grafts due to occlusion of both OM branches.   . Diabetes mellitus without complication ([place])   . HCV (hepatitis C virus)   . HLD (hyperlipidemia)   . Hypertension     Past Surgical History:  Procedure Laterality Date  . CARDIAC CATHETERIZATION N/A 04/23/2016   Procedure: Left Heart Cath and Cors/Grafts Angiography;  Surgeon: Jettie Booze, MD;  Location: [facility];  Service: Cardiovascular;  Laterality: N/A;  . CARDIAC CATHETERIZATION N/A 04/23/2016   Procedure: Coronary Stent Intervention;  Surgeon: Jettie Booze, MD;  Location: [facility];  Service: Cardiovascular;  Laterality: N/A;  DES to Proximal SVG to PDA  . CORONARY ARTERY BYPASS GRAFT      Current Medications: Outpatient Medications Prior to Visit  Medication Sig Dispense Refill  . aspirin 81 MG tablet Take 1 tablet by mouth daily.    . calcium carbonate (CALCIUM-CARB 600) 600 MG TABS tablet Take 2  tablets by mouth daily.    . hydrochlorothiazide (HYDRODIURIL) 12.5 MG tablet Take 1 tablet by mouth daily.    . metFORMIN (GLUCOPHAGE-XR) 500 MG 24 hr tablet Take 1 tablet by mouth daily.    . metoprolol succinate (TOPROL-XL) 25 MG 24 hr tablet Take 0.5 tablets by mouth daily.    . nitroGLYCERIN (NITROSTAT) 0.4 MG SL tablet Place 1 tablet (0.4 mg total) under the tongue every 5 (five) minutes x 3 doses as needed for chest pain. 25 tablet 12  . Omega-3 Fatty Acids (FISH OIL) 1000 MG CAPS Take 2 capsules by mouth daily.    . pravastatin (PRAVACHOL) 80 MG tablet Take 1 tablet (80 mg total) by mouth daily. 30 tablet 3  . ticagrelor (BRILINTA) 90 MG TABS tablet Take 1 tablet (90 mg total) by mouth 2 (two) times daily. 180 tablet 3  . lisinopril (PRINIVIL,ZESTRIL) 20 MG tablet Take 1 tablet by mouth daily.     No facility-administered medications prior to visit.      Allergies:   Review of patient's allergies indicates no known allergies.   Social History   Social History  . Marital status: Married    Spouse name: N/A  . Number of children: N/A  . Years of education: N/A   Social History Main Topics  . Smoking status: Never Smoker  . Smokeless tobacco: Never Used  . Alcohol use No  . Drug use:     Types: Marijuana  . Sexual activity: Not on file   Other Topics Concern  . Not on file   Social History Narrative  . No narrative on file     Family History:  The patient's family history is not on file.   ROS:   Please see the history of present illness.    ROS All other systems reviewed and are negative.   PHYSICAL EXAM:   VS:  BP (!) 175/94   Pulse 66   Ht 6\' 1"  (1.854 m)   Wt 183 lb 6.4 oz (83.2 kg)   BMI 24.20 kg/m    GEN: Well nourished, well developed, in no acute distress  HEENT: normal  Neck: no JVD, carotid bruits, or masses Cardiac: RRR; no murmurs, rubs, or gallops,no edema  Respiratory:  clear to auscultation bilaterally, normal work of breathing GI: soft,  nontender, nondistended, + BS MS: no deformity or atrophy  Skin: warm and dry, no rash Neuro:  Alert and Oriented x 3, Strength and sensation are intact Psych: euthymic mood, full affect  Wt Readings from Last 3 Encounters:  05/09/16 183 lb 6.4 oz (83.2 kg)  04/24/16 180 lb 12.4 oz (82 kg)      Studies/Labs Reviewed:   EKG:  EKG is not ordered today  Recent Labs: 04/21/2016: ALT 20; TSH 0.871 04/24/2016: BUN 15; Creatinine, Ser 1.18; Hemoglobin 12.2; Platelets 151; Potassium 4.0; Sodium 137   Lipid Panel    Component Value Date/Time   CHOL 162  04/22/2016 0309   TRIG 69 04/22/2016 0309   HDL 53 04/22/2016 0309   CHOLHDL 3.1 04/22/2016 0309   VLDL 14 04/22/2016 0309   LDLCALC 95 04/22/2016 0309    Additional studies/ records that were reviewed today include:    Echo 04/22/2016 LV EF: 60% -   65%  ------------------------------------------------------------------- Indications:      MI - acute 410.91.  ------------------------------------------------------------------- History:   PMH:   Myocardial infarction.  Risk factors: Hypertension. Diabetes mellitus.  ------------------------------------------------------------------- Study Conclusions  - Left ventricle: The cavity size was normal. There was moderate   concentric hypertrophy. Systolic function was normal. The   estimated ejection fraction was in the range of 60% to 65%.   Possible mild hypokinesis of the inferior myocardium. Features   are consistent with a pseudonormal left ventricular filling   pattern, with concomitant abnormal relaxation and increased   filling pressure (grade 2 diastolic dysfunction).   Cath 04/23/2016 Conclusion     Prox RCA lesion, 80 %stenosed. Dist RCA diffuse lesion, 50 %stenosed.  Left main into Ost Cx lesion, 80 %stenosed, not bypassed.  Prox LAD lesion, 100 %stenosed. Ost LAD to Prox LAD lesion, 75 %stenosed. LIMA to LAD is patent.  Ost 1st Mrg to 1st Mrg lesion, 100  %stenosed. Ost 2nd Mrg to 2nd Mrg lesion, 100 %stenosed. SVG to jump graft from OM1 to OM2 is widely patent.  SVG to PDA Origin lesion, 90 %stenosed.A STENT SYNERGY DES 3.5X20 drug eluting stent was successfully placed and dilated to 4 mm.  Post intervention, there is a 0% residual stenosis.  The left ventricular ejection fraction is 55-65% by visual estimate.  LV end diastolic pressure is normal.  The left ventricular systolic function is normal.  There is no aortic valve stenosis.   Continue dual antiplatelet therapy for at least a year.  Consider PCI of the left main into the circumflex.        ASSESSMENT:    1. Coronary artery disease involving coronary bypass graft of native heart without angina pectoris   2. Essential hypertension   3. Hyperlipidemia LDL goal <70   4. Type 2 diabetes mellitus without complication, without long-term current use of insulin (HCC)      PLAN:  In order of problems listed above:   1. CAD s/p 5v CABG 2001 in [place]: Recently underwent DES to PDA, has residual 80% stenosis extending into ostial left circumflex treated medically. Per Dr. Allison Quarry note, if he develops recurrent symptoms, we can potentially consider staged PCI of left circumflex at a later time. He denies any chest pain that is reminiscent of his previous angina. We discussed the need to be compliant with medication.  2. HTN: Uncontrolled with systolic blood pressure [id number], I have referred him to our hypertension clinic. We will increase his lisinopril to 40 mg daily. He will obtain basic metabolic panel in one week after increasing lisinopril. He will follow-up with hypertension clinic in 2 weeks.  3. HLD with goal LDL < 70: Recheck fasting lipid panel and LFTs in 2 months on follow-up. Zetia 10 mg was added as his LDL is 95.  4. DM II: He says he is still followed by his PCP up in [place] intermittently, we will fax this record to his PCP who will manage his  diabetes.    Medication Adjustments/Labs and Tests Ordered: Current medicines are reviewed at length with the patient today.  Concerns regarding medicines are outlined above.  Medication changes, Labs and Tests ordered today  are listed in the Patient Instructions below. Patient Instructions  Medication Instructions:  INCREASE Lisinopril 40 mg daily and START Zetia 10 mg daily  Labwork: BMP in 1 week and Fasting Lipids in 2 months  Testing/Procedures: None Ordered  Follow-Up: Your physician recommends that you schedule a follow-up appointment in: 2 Months with Dr Ellyn Hack Your physician recommends that you schedule a follow-up appointment in: 2 week with Erasmo Downer Blood Pressure.   Any Other Special Instructions Will Be Listed Below (If Applicable).   If you need a refill on your cardiac medications before your next appointment, please call your pharmacy.      Hilbert Corrigan, [place]  05/09/2016 1:53 PM    [facility] [address], [place], [place]  [address] Phone: [phone]; Fax: [phone]

## 2016-05-09 NOTE — Patient Instructions (Addendum)
Medication Instructions:  INCREASE Lisinopril 40 mg daily and START Zetia 10 mg daily  Labwork: BMP in 1 week and Fasting Lipids in 2 months  Testing/Procedures: None Ordered  Follow-Up: Your physician recommends that you schedule a follow-up appointment in: 2 Months with Dr Ellyn Hack Your physician recommends that you schedule a follow-up appointment in: 2 week with Erasmo Downer Blood Pressure.   Any Other Special Instructions Will Be Listed Below (If Applicable).   If you need a refill on your cardiac medications before your next appointment, please call your pharmacy.

## 2016-05-09 NOTE — Progress Notes (Addendum)
See separate note, patient was initially no show, but ended up arriving late for 45 min.  ROS

## 2016-05-09 NOTE — Addendum Note (Signed)
Addended by: Vennie Homans on: 05/09/2016 12:12 PM   Modules accepted: Orders

## 2016-05-23 ENCOUNTER — Other Ambulatory Visit: Payer: Self-pay | Admitting: Physician Assistant

## 2016-05-24 ENCOUNTER — Ambulatory Visit (INDEPENDENT_AMBULATORY_CARE_PROVIDER_SITE_OTHER): Payer: Medicare Other | Admitting: Pharmacist Clinician (PhC)/ Clinical Pharmacy Specialist

## 2016-05-24 DIAGNOSIS — I25810 Atherosclerosis of coronary artery bypass graft(s) without angina pectoris: Secondary | ICD-10-CM

## 2016-05-24 DIAGNOSIS — I10 Essential (primary) hypertension: Secondary | ICD-10-CM | POA: Diagnosis not present

## 2016-05-24 LAB — BASIC METABOLIC PANEL
BUN/Creatinine Ratio: 25 — ABNORMAL HIGH (ref 10–24)
BUN: 39 mg/dL — AB (ref 8–27)
CO2: 22 mmol/L (ref 18–29)
Calcium: 10 mg/dL (ref 8.6–10.2)
Chloride: 99 mmol/L (ref 96–106)
Creatinine, Ser: 1.54 mg/dL — ABNORMAL HIGH (ref 0.76–1.27)
GFR calc Af Amer: 52 mL/min/{1.73_m2} — ABNORMAL LOW (ref >59)
GFR, EST NON AFRICAN AMERICAN: 45 mL/min/{1.73_m2} — AB (ref >59)
Glucose: 83 mg/dL (ref 65–99)
POTASSIUM: 4.4 mmol/L (ref 3.5–5.2)
SODIUM: 137 mmol/L (ref 134–144)

## 2016-05-24 NOTE — Assessment & Plan Note (Signed)
His pressure is well controlled in the office, but because of symptomatic hypotension at home recently, I am going to cut his lisinopril back to 20 mg daily.  He will continue with regular home BP checks and we will see him in 3-4 weeks for follow up.  He will bring his home cuff in for verification.  Encouraged him to continue with his healthy diet and exercise regimen and answered his other medication questions as well

## 2016-05-24 NOTE — Patient Instructions (Signed)
   Your blood pressure today is 126/72  (goal <140/90)  Check your blood pressure at home 4-5 times per week and keep record of the readings. Take your BP meds as follows: cut back on the lisinopril to 1 tablet (20 mg) daily.  Continue all other medications  Bring all of your meds, your BP cuff and your record of home blood pressures to your next appointment.  Exercise as you're able, try to walk approximately 30 minutes per day.  Keep salt intake to a minimum, especially watch canned and prepared boxed foods.  Eat more fresh fruits and vegetables and fewer canned items.  Avoid eating in fast food restaurants.    HOW TO TAKE YOUR BLOOD PRESSURE: . Rest 5 minutes before taking your blood pressure. .  Don't smoke or drink caffeinated beverages for at least 30 minutes before. . Take your blood pressure before (not after) you eat. . Sit comfortably with your back supported and both feet on the floor (don't cross your legs). . Elevate your arm to heart level on a table or a desk. . Use the proper sized cuff. It should fit smoothly and snugly around your bare upper arm. There should be enough room to slip a fingertip under the cuff. The bottom edge of the cuff should be 1 inch above the crease of the elbow. . Ideally, take 3 measurements at one sitting and record the average.

## 2016-05-24 NOTE — Progress Notes (Signed)
05/24/2016 Brandon Vasquez 06/12/1946 [id number]   HPI:  Brandon Vasquez is a 70 y.o. male patient of Dr Ellyn Hack, with a PMH below who presents today for hypertension clinic evaluation.  He was seen by Almyra Deforest after discharge from [facility] post MI in cath lab.  He has many questions today about his heart condition and blood pressure.  He was told to increase his lisinopril from 20-40 mg daily after that visit. (pressure was 175-94).   Since then he reports home pressures mostly between 98-110 and reports some episodes of dizziness/vertigo, especially when he gets up too quickly.     Blood Pressure Goal:     140/90 (DM)  Current Medications:  Lisinopril 40 mg qd, hctz 12.5 mg qd, metoprolol succ 12.5 mg qd  Cardiac Hx:  CAD s/p CABG x 5 in 2001; NSTEMI August 2017, hyperlipidemia  Family Hx:  Mother had hypertension, died in her 82's from aneurysm, father still living at 67 with Alzheimers  Social Hx:  Quit smoking about 20 years ago, no alcohol, rare caffeine  Diet:  Tries to watch carefully now, cut out red meat and pork, eating mostly chicken.  Seasons food with low salt when cooking, none at table.  Exercise:  Treadmill or stationary bike 30 minutes per day, plus weight lifting, punching bag; also does yard work regularly  Home BP readings:  Did not bring home cuff, but has Omron meter about 69 years old.  Majority of readings per patient were [id number] systolic.  Intolerances:   None  Wt Readings from Last 3 Encounters:  05/09/16 183 lb 6.4 oz (83.2 kg)  04/24/16 180 lb 12.4 oz (82 kg)   BP Readings from Last 3 Encounters:  05/24/16 126/72  05/09/16 (!) 175/94  04/24/16 (!) 146/70   Pulse Readings from Last 3 Encounters:  05/24/16 68  05/09/16 66  04/24/16 65    Current Outpatient Prescriptions  Medication Sig Dispense Refill  . aspirin 81 MG tablet Take 1 tablet by mouth daily.    . calcium carbonate (CALCIUM-CARB 600) 600 MG TABS tablet Take 2 tablets by  mouth daily.    Marland Kitchen ezetimibe (ZETIA) 10 MG tablet Take 1 tablet (10 mg total) by mouth daily. 30 tablet 11  . hydrochlorothiazide (HYDRODIURIL) 12.5 MG tablet Take 1 tablet by mouth daily.    Marland Kitchen lisinopril (PRINIVIL,ZESTRIL) 40 MG tablet Take 1 tablet (40 mg total) by mouth daily. 30 tablet 11  . metFORMIN (GLUCOPHAGE-XR) 500 MG 24 hr tablet Take 1 tablet by mouth daily.    . metoprolol succinate (TOPROL-XL) 25 MG 24 hr tablet Take 0.5 tablets by mouth daily.    . nitroGLYCERIN (NITROSTAT) 0.4 MG SL tablet Place 1 tablet (0.4 mg total) under the tongue every 5 (five) minutes x 3 doses as needed for chest pain. 25 tablet 12  . Omega-3 Fatty Acids (FISH OIL) 1000 MG CAPS Take 2 capsules by mouth daily.    . pravastatin (PRAVACHOL) 80 MG tablet Take 1 tablet (80 mg total) by mouth daily. 30 tablet 3  . ticagrelor (BRILINTA) 90 MG TABS tablet Take 1 tablet (90 mg total) by mouth 2 (two) times daily. 180 tablet 3   No current facility-administered medications for this visit.     No Known Allergies  Past Medical History:  Diagnosis Date  . CAD (coronary artery disease)    a. 2005: CABG x5V in [place]  b. 04/2016: PCI/DES to SVG--> RCA. He also had severe ostial native  circumflex with the distal circumflex not being protected by grafts due to occlusion of both OM branches.   . Diabetes mellitus without complication ([place])   . HCV (hepatitis C virus)   . HLD (hyperlipidemia)   . Hypertension     Blood pressure 126/72, pulse 68.  Essential hypertension His pressure is well controlled in the office, but because of symptomatic hypotension at home recently, I am going to cut his lisinopril back to 20 mg daily.  He will continue with regular home BP checks and we will see him in 3-4 weeks for follow up.  He will bring his home cuff in for verification.  Encouraged him to continue with his healthy diet and exercise regimen and answered his other medication questions as well   Tommy Medal PharmD  CPP [place]

## 2016-06-05 ENCOUNTER — Encounter: Payer: Self-pay | Admitting: *Deleted

## 2016-06-11 ENCOUNTER — Telehealth: Payer: Self-pay | Admitting: Physician Assistant

## 2016-06-11 DIAGNOSIS — I10 Essential (primary) hypertension: Secondary | ICD-10-CM

## 2016-06-11 NOTE — Telephone Encounter (Signed)
Returned pts call.  He was calling as a result from a letter he received to call the office, after several attempts by phone, to discuss his lab results.  Pt advised that he was told 2-3 weeks to decrease Lisinopril to 20 mg daily, so therefore, pt will come by the lab tomorrow, 06/12/16 for repeat bmet/  [facility].  Pt agreeable with this plan and verbalized understanding.

## 2016-06-11 NOTE — Telephone Encounter (Signed)
Pt got letter regarding labs results-told to call [phone]

## 2016-06-12 ENCOUNTER — Other Ambulatory Visit: Payer: Self-pay | Admitting: *Deleted

## 2016-06-12 DIAGNOSIS — I10 Essential (primary) hypertension: Secondary | ICD-10-CM

## 2016-06-12 LAB — BASIC METABOLIC PANEL
BUN: 41 mg/dL — ABNORMAL HIGH (ref 7–25)
CALCIUM: 9.6 mg/dL (ref 8.6–10.3)
CO2: 24 mmol/L (ref 20–31)
CREATININE: 1.57 mg/dL — AB (ref 0.70–1.25)
Chloride: 101 mmol/L (ref 98–110)
Glucose, Bld: 81 mg/dL (ref 65–99)
Potassium: 4.6 mmol/L (ref 3.5–5.3)
SODIUM: 136 mmol/L (ref 135–146)

## 2016-06-13 MED ORDER — LISINOPRIL 20 MG PO TABS: 20.0000 mg | ORAL_TABLET | Freq: Every day | ORAL | 0 refills | Status: DC

## 2016-06-14 NOTE — Progress Notes (Signed)
Thank you :)

## 2016-06-21 ENCOUNTER — Encounter: Payer: Self-pay | Admitting: Pharmacist Clinician (PhC)/ Clinical Pharmacy Specialist

## 2016-06-21 ENCOUNTER — Ambulatory Visit (INDEPENDENT_AMBULATORY_CARE_PROVIDER_SITE_OTHER): Payer: Medicare Other | Admitting: Pharmacist Clinician (PhC)/ Clinical Pharmacy Specialist

## 2016-06-21 VITALS — BP 132/80 | HR 76

## 2016-06-21 DIAGNOSIS — I10 Essential (primary) hypertension: Secondary | ICD-10-CM

## 2016-06-21 DIAGNOSIS — I25810 Atherosclerosis of coronary artery bypass graft(s) without angina pectoris: Secondary | ICD-10-CM | POA: Diagnosis not present

## 2016-06-21 MED ORDER — EZETIMIBE 10 MG PO TABS: 10.0000 mg | ORAL_TABLET | Freq: Every day | ORAL | 11 refills | Status: DC

## 2016-06-21 MED ORDER — AMLODIPINE BESYLATE 2.5 MG PO TABS: 2.5000 mg | ORAL_TABLET | Freq: Every day | ORAL | 3 refills | Status: DC

## 2016-06-21 NOTE — Assessment & Plan Note (Signed)
Blood pressure looks good in the office today.  He has some concerns about taking lisinopril - has heard stories about angioedema and kidney problems.  Assured him that the angioedema can be severe, but is very rare.  His SCr has increased over the past month.  Will switch him from lisinopril to amlodipine 2.5 mg once daily.  He will continue to monitor home BP and follow up with Dr. Ellyn Hack in about 3 weeks

## 2016-06-21 NOTE — Progress Notes (Signed)
06/21/2016 Costella Hatcher [birth year] [id number]   HPI:  Brandon Vasquez is a 70 y.o. male patient of Dr Ellyn Hack, with a PMH below who presents today for hypertension clinic follow up.  He was seen by Almyra Deforest after discharge from [facility] post MI in cath lab.  When I saw him last, about a month ago, we cut the lisinopril dose from 40 to 20 mg daily.  He had been having some problems with orthostatic hypotension.    Today he reports feeling well, although he still has problems with orthostasis.      Blood Pressure Goal:     140/90 (DM)  Current Medications:  Lisinopril 20 mg qd, hctz 12.5 mg qd, metoprolol succ 12.5 mg qd  Cardiac Hx:  CAD s/p CABG x 5 in 2001; NSTEMI August 2017, hyperlipidemia  Family Hx:  Mother had hypertension, died in her 38's from aneurysm, father still living at 65 with Alzheimers  Social Hx:  Quit smoking about 20 years ago, no alcohol, rare caffeine  Diet:  Tries to watch carefully now, cut out red meat and pork, eating mostly chicken.  Seasons food with low salt when cooking, none at table.  Exercise:  Treadmill or stationary bike 30 minutes per day, plus weight lifting, punching bag; also does yard work regularly  Home BP readings:  Home cuff (Omron) read within 10 points of office cuff.  Patient checked both morning and evening since last visit.  AM readings averaged 130/75 with PM readings at 114/69.  Range was 93-142/62-83.    Intolerances:   None  Wt Readings from Last 3 Encounters:  05/09/16 183 lb 6.4 oz (83.2 kg)  04/24/16 180 lb 12.4 oz (82 kg)   BP Readings from Last 3 Encounters:  06/21/16 132/80  05/24/16 126/72  05/09/16 (!) 175/94   Pulse Readings from Last 3 Encounters:  06/21/16 76  05/24/16 68  05/09/16 66    Current Outpatient Prescriptions  Medication Sig Dispense Refill  . amLODipine (NORVASC) 2.5 MG tablet Take 1 tablet (2.5 mg total) by mouth daily. 30 tablet 3  . aspirin 81 MG tablet Take 1 tablet by mouth  daily.    . calcium carbonate (CALCIUM-CARB 600) 600 MG TABS tablet Take 2 tablets by mouth daily.    Marland Kitchen ezetimibe (ZETIA) 10 MG tablet Take 1 tablet (10 mg total) by mouth daily. 30 tablet 11  . hydrochlorothiazide (HYDRODIURIL) 12.5 MG tablet Take 1 tablet by mouth daily.    Marland Kitchen lisinopril (PRINIVIL,ZESTRIL) 20 MG tablet Take 1 tablet (20 mg total) by mouth daily. 30 tablet 0  . metFORMIN (GLUCOPHAGE-XR) 500 MG 24 hr tablet Take 1 tablet by mouth daily.    . metoprolol succinate (TOPROL-XL) 25 MG 24 hr tablet Take 0.5 tablets by mouth daily.    . nitroGLYCERIN (NITROSTAT) 0.4 MG SL tablet Place 1 tablet (0.4 mg total) under the tongue every 5 (five) minutes x 3 doses as needed for chest pain. 25 tablet 12  . Omega-3 Fatty Acids (FISH OIL) 1000 MG CAPS Take 2 capsules by mouth daily.    . pravastatin (PRAVACHOL) 80 MG tablet Take 1 tablet (80 mg total) by mouth daily. 30 tablet 3  . ticagrelor (BRILINTA) 90 MG TABS tablet Take 1 tablet (90 mg total) by mouth 2 (two) times daily. 180 tablet 3   No current facility-administered medications for this visit.     No Known Allergies  Past Medical History:  Diagnosis Date  . CAD (  coronary artery disease)    a. 2005: CABG x5V in [place]  b. 04/2016: PCI/DES to SVG--> RCA. He also had severe ostial native circumflex with the distal circumflex not being protected by grafts due to occlusion of both OM branches.   . Diabetes mellitus without complication ([place])   . HCV (hepatitis C virus)   . HLD (hyperlipidemia)   . Hypertension     Blood pressure 132/80, pulse 76.  Standing BP 126/72  Essential hypertension Blood pressure looks good in the office today.  He has some concerns about taking lisinopril - has heard stories about angioedema and kidney problems.  Assured him that the angioedema can be severe, but is very rare.  His SCr has increased over the past month.  Will switch him from lisinopril to amlodipine 2.5 mg once daily.  He will continue to  monitor home BP and follow up with Dr. Ellyn Hack in about 3 weeks   Tommy Medal PharmD CPP [place]

## 2016-06-21 NOTE — Patient Instructions (Signed)
Return for a a follow up appointment with Dr. Ellyn Hack on November 8  Your blood pressure today is 132/80 (goal is < 140/90)  Check your blood pressure at home daily and keep record of the readings.  Take your BP meds as follows:  Stop lisinopril; start amlodipine 2.5 mg once daily  Bring all of your meds, your BP cuff and your record of home blood pressures to your next appointment.  Exercise as you're able, try to walk approximately 30 minutes per day.  Keep salt intake to a minimum, especially watch canned and prepared boxed foods.  Eat more fresh fruits and vegetables and fewer canned items.  Avoid eating in fast food restaurants.    HOW TO TAKE YOUR BLOOD PRESSURE: . Rest 5 minutes before taking your blood pressure. .  Don't smoke or drink caffeinated beverages for at least 30 minutes before. . Take your blood pressure before (not after) you eat. . Sit comfortably with your back supported and both feet on the floor (don't cross your legs). . Elevate your arm to heart level on a table or a desk. . Use the proper sized cuff. It should fit smoothly and snugly around your bare upper arm. There should be enough room to slip a fingertip under the cuff. The bottom edge of the cuff should be 1 inch above the crease of the elbow. . Ideally, take 3 measurements at one sitting and record the average.

## 2016-07-06 ENCOUNTER — Encounter: Payer: Self-pay | Admitting: Cardiology

## 2016-07-10 ENCOUNTER — Other Ambulatory Visit: Payer: Self-pay | Admitting: Cardiology

## 2016-07-10 ENCOUNTER — Encounter: Payer: Self-pay | Admitting: Cardiology

## 2016-07-10 DIAGNOSIS — I25719 Atherosclerosis of autologous vein coronary artery bypass graft(s) with unspecified angina pectoris: Secondary | ICD-10-CM | POA: Insufficient documentation

## 2016-07-10 DIAGNOSIS — I25810 Atherosclerosis of coronary artery bypass graft(s) without angina pectoris: Secondary | ICD-10-CM | POA: Insufficient documentation

## 2016-07-10 LAB — COMPREHENSIVE METABOLIC PANEL
ALBUMIN: 4.4 g/dL (ref 3.6–5.1)
ALT: 13 U/L (ref 9–46)
AST: 20 U/L (ref 10–35)
Alkaline Phosphatase: 40 U/L (ref 40–115)
BILIRUBIN TOTAL: 0.4 mg/dL (ref 0.2–1.2)
BUN: 22 mg/dL (ref 7–25)
CALCIUM: 9.8 mg/dL (ref 8.6–10.3)
CHLORIDE: 103 mmol/L (ref 98–110)
CO2: 25 mmol/L (ref 20–31)
Creat: 1.31 mg/dL — ABNORMAL HIGH (ref 0.70–1.25)
GLUCOSE: 106 mg/dL — AB (ref 65–99)
POTASSIUM: 3.9 mmol/L (ref 3.5–5.3)
Sodium: 139 mmol/L (ref 135–146)
Total Protein: 7.4 g/dL (ref 6.1–8.1)

## 2016-07-10 NOTE — Progress Notes (Signed)
PCP: No PCP Per Patient  Clinic Note: Chief Complaint  Patient presents with  . Follow-up    2 mo rov patient reports no problems.    HPI: Brandon Vasquez is a 70 y.o. male with a PMH below who presents today for post hospital follow-up after presenting with mild non-ST elevation MI --> this was in the setting of the accelerated hypertension.  He has a history of multivessel coronary artery disease status post CABG 4 (LIMA-LAD {could be to D1-L, SVG-RPDA, SVG-OM1-OM 2) in 2001 (in [place])--> after presenting with cardiac arrest and emergent catheterization. - He also has hypertension, hyperlipidemia and type 2 diabetes mellitus.  [name] was initially seen on while hospitalized in August 2017. He was then seen on September 21 by Tommy Medal, [facility] for post hospital follow-up: He had asymptomatic hypotension at home. Lisinopril was reduced to 20 mg daily - then d/c'd totally.  Added Amlodipine & HCTZ.  Also added Zetia.   Recent Hospitalizations: August 19-22, 2017 --> non-ST elevation MI with troponin levels went up to 5.57. He underwent cardiac catheterization ring the culprit lesion in the SVG-RCA treated with a DES PCI. Also noted was severe ostial and distal native circumflex disease not protected by OM branches --> plan was medical management and possible staged PCI if symptoms warrant.  Studies Reviewed:   2-D Echocardiogram 04/22/2016: Moderate concentric LVH. EF 60 MG 25%. Possible mild inferior hypokinesis. GR 2 DD (pseudo-normal)  Cardiac CATH-PCI 04/23/2016 Left Heart Cath and Cors/Grafts Angiography  Coronary Stent Intervention     SVG to PDA Origin lesion, 90 %stenosed.A STENT SYNERGY DES 3.5X20 drug eluting stent was successfully placed and dilated to 4 mm.  Post intervention, there is a 0% residual stenosis.  Prox RCA lesion, 80 %stenosed. Dist RCA diffuse lesion, 50 %stenosed.   Prox LAD lesion, 100 %stenosed. Ost LAD to Prox LAD lesion, 75 %stenosed.    LIMA to LAD is patent.    Left main into Ost Cx lesion, 80 %stenosed, not bypassed.  Ost 1st Mrg to 1st Mrg lesion, 100 %stenosed. Ost 2nd Mrg to 2nd Mrg lesion, 100 %stenosed. SeqSVG-OM1-OM2 is widely patent.   Very small, spindly downstream vessels   The left ventricular systolic function is normal. The left ventricular ejection fraction is 55-65% by visual estimate.  LVEDP is normal.  There is no aortic valve stenosis.    Continue dual antiplatelet therapy for at least a year.  Consider PCI of the left main into the circumflex.     Diagnostic Diagram      Post-Intervention Diagram          Interval History: Brandon Vasquez presents today doing pretty well. He really doesn't have any active cardiac symptoms of his anginal equivalent heaviness and tightness in his chest. He describes his MI related angina as a discomfort in the center of his chest that just hasn't feel scared. There is no intense pain per se, just this discomfort that is very bothersome. He also notes some right flank pain that is occurred with both of his cardiac episodes. He has not had any of that since his heart catheterization. He is fairly active, but doesn't do routine exercise per se. He tries to stay somewhat active most days. He denies any significant resting or exertional chest tightness/pressure or dyspnea. The heart failure symptoms of PND, orthopnea or edema. No palpitations, lightheadedness, dizziness, weakness or syncope/near syncope. No TIA/amaurosis fugax symptoms. No melena, hematochezia, hematuria, or epstaxis. No claudication - he actually has  leg cramping that happens more at night, or rest.  ROS: A comprehensive was performed. Review of Systems  Constitutional: Negative.   HENT: Negative for congestion and nosebleeds.   Eyes: Negative.   Respiratory: Negative for cough, shortness of breath (Only if he really exerts himself) and wheezing.   Cardiovascular:       Per history of present illness   Gastrointestinal: Negative for abdominal pain, blood in stool, heartburn, melena and nausea.  Musculoskeletal: Positive for joint pain (Mild arthritis her aches and pains).  Skin: Negative.   Neurological: Negative.  Negative for dizziness.  Psychiatric/Behavioral: Negative.  Negative for depression and memory loss. The patient is not nervous/anxious and does not have insomnia.     Past Medical History:  Diagnosis Date  . Coronary artery disease involving native heart with angina pectoris [facility]) 2005   a. 2005: CABG x5V in [place]  b. 04/2016: PCI/DES to SVG--> RCA. He also had severe ostial native circumflex with the distal circumflex not being protected by grafts due to occlusion of both OM branches.   . Essential hypertension   . HCV (hepatitis C virus)   . HLD (hyperlipidemia)   . Hx of CABG 2005   [place] - CABG 4: LIMA-LAD, SeqSVG-OM1-OM2,SVG-rPDA  . Non-ST elevation MI (NSTEMI) ([place]) 04/2016   Culprit lesion was 90% SVG-RPDA -> DES PCI. Also noted 80% ostial native circumflex with distal circumflex potentially in jeopardy -> MEDICAL Management.  . Type 2 diabetes mellitus with complication (HCC)    CAD    Past Surgical History:  Procedure Laterality Date  . CARDIAC CATHETERIZATION N/A 04/23/2016   Procedure: Left Heart Cath and Cors/Grafts Angiography;  Surgeon: Jettie Booze, MD;  Location: [facility];  Service: Cardiovascular: ostLAD 75%->pLAD 100%, ostOM1 & OM2 100%, pRCA 80% -> dRCA 50%; LIMA-LAD patent, SVG-OM1-OM2 ~20% prox otw patentt, ost-pSVG-rPDA 90% --> PCI  . CARDIAC CATHETERIZATION N/A 04/23/2016   Procedure: Coronary Stent Intervention;  Surgeon: Jettie Booze, MD;  Location: [facility];  Service: Cardiovascular;  Laterality: N/A;  DES PCI - 90% pSVG- PDA (Synergy DES 3.5 x 20)  . CORONARY ARTERY BYPASS GRAFT  2005   LIMA-LAD, SeqSVG-OM1-OM2,SVG-rPDA  . TRANSTHORACIC ECHOCARDIOGRAM  04/2016   EF 60-65%. GR 2 DD. Inferior hypokinesis. No  significant valvular lesions    Prior to Admission medications   Medication Sig Start Date End Date Authorizing Provider  amLODipine (NORVASC) 2.5 MG tablet Take 1 tablet (2.5 mg total) by mouth daily. 06/21/16 07/21/16 Rockne Menghini, RPH-CPP  aspirin 81 MG tablet Take 1 tablet by mouth daily.   Historical Provider, MD  calcium carbonate (CALCIUM-CARB 600) 600 MG TABS tablet Take 2 tablets by mouth daily.   Historical Provider, MD  ezetimibe (ZETIA) 10 MG tablet Take 1 tablet (10 mg total) by mouth daily. 06/21/16 09/19/16 Leonie Man, MD  hydrochlorothiazide (HYDRODIURIL) 12.5 MG tablet Take 1 tablet by mouth daily.   Historical Provider, MD  lisinopril (PRINIVIL,ZESTRIL) 20 MG tablet Take 1 tablet (20 mg total) by mouth daily. 06/13/16 Not taking  Leonie Man, MD  metFORMIN (GLUCOPHAGE-XR) 500 MG 24 hr tablet Take 1 tablet by mouth daily.   Historical Provider, MD  metoprolol succinate (TOPROL-XL) 25 MG 24 hr tablet Take 0.5 tablets by mouth daily.   Historical Provider, MD  nitroGLYCERIN (NITROSTAT) 0.4 MG SL tablet Place 1 tablet (0.4 mg total) under the tongue every 5 (five) minutes x 3 doses as needed for chest pain. 04/24/16  Eileen Stanford, PA-C  Omega-3 Fatty Acids (FISH OIL) 1000 MG CAPS Take 2 capsules by mouth daily.   Historical Provider, MD  pravastatin (PRAVACHOL) 80 MG tablet Take 1 tablet (80 mg total) by mouth daily. 04/24/16  Eileen Stanford, PA-C  ticagrelor (BRILINTA) 90 MG TABS tablet Take 1 tablet (90 mg total) by mouth 2 (two) times daily. 04/24/16  Eileen Stanford, PA-C    No Known Allergies  Social History   Social History  . Marital status: Married    Spouse name: N/A  . Number of children: N/A  . Years of education: N/A   Social History Main Topics  . Smoking status: Former Smoker    Packs/day: 1.00    Years: 30.00    Types: Cigarettes    Quit date: 07/10/1996  . Smokeless tobacco: Never Used  . Alcohol use No  . Drug use:     Types:  Marijuana  . Sexual activity: Not Asked   Other Topics Concern  . None   Social History Narrative  . None    Family History  Problem Relation Age of Onset  . Hypertension Mother   . Cerebral aneurysm Mother     Died in her 18s  . Alzheimer's disease Father     Wt Readings from Last 3 Encounters:  07/11/16 82.8 kg (182 lb 9.6 oz)  05/09/16 83.2 kg (183 lb 6.4 oz)  04/24/16 82 kg (180 lb 12.4 oz)    PHYSICAL EXAM BP 137/82   Pulse 65   Ht 6\' 1"  (1.854 m)   Wt 82.8 kg (182 lb 9.6 oz)   BMI 24.09 kg/m  General appearance: alert, cooperative, appears stated age, no distress and Well-nourished, well-groomed. HEENT: [place]/AT, EOMI, MMM, anicteric sclera Neck: no adenopathy, no carotid bruit and no JVD Lungs: clear to auscultation bilaterally, normal percussion bilaterally and non-labored Heart: regular rate and rhythm, S1& S2 normal, no murmur, click, rub or gallop ; non-displaced PMI Abdomen: soft, non-tender; bowel sounds normal; no masses,  no organomegaly;  Extremities: extremities normal, atraumatic, no cyanosis, or edema Pulses: 2+ and symmetric; no bruits Skin: mobility and turgor normal, no evidence of bleeding or bruising and no lesions noted  Neurologic: Mental status: Alert, oriented, thought content appropriate Cranial nerves: normal (II-XII grossly intact)   Adult ECG Report N/A  Other studies Reviewed: Additional studies/ records that were reviewed today include:  Recent Labs:   Lab Results  Component Value Date   CHOL 162 04/22/2016   HDL 53 04/22/2016   LDLCALC 95 04/22/2016   TRIG 69 04/22/2016   CHOLHDL 3.1 04/22/2016     Chemistry      Component Value Date/Time   NA 139 07/10/2016 1027   NA 137 05/23/2016 1353   K 3.9 07/10/2016 1027   CL 103 07/10/2016 1027   CO2 25 07/10/2016 1027   BUN 22 07/10/2016 1027   BUN 39 (H) 05/23/2016 1353   CREATININE 1.31 (H) 07/10/2016 1027      Component Value Date/Time   CALCIUM 9.8 07/10/2016 1027    ALKPHOS 40 07/10/2016 1027   AST 20 07/10/2016 1027   ALT 13 07/10/2016 1027   BILITOT 0.4 07/10/2016 1027      ASSESSMENT / PLAN: Problem List Items Addressed This Visit    Hx of CABG x 5: In 2001, in [place] (Chronic)   Relevant Orders   Lipid panel   Comprehensive metabolic panel   Hemoglobin A1c   Coronary artery disease involving  native heart with angina pectoris ([place]) - Primary (Chronic)    He has significant native coronary disease with patent grafts now with a stent in one of the vein grafts. He does have the ostial circumflex lesion, but is not actively having anginal symptoms. In light of him not having symptoms, I'm not inclined necessarily to pursue ischemic evaluation at this point. He will need relatively close follow-up with stress testing. Now on aspirin plus Brilinta with no bleeding issues. On low-dose Toprol along with his statin. He is taking amlodipine as opposed to lisinopril because of issues with hypotension.      Relevant Medications   hydrochlorothiazide (HYDRODIURIL) 12.5 MG tablet   ezetimibe (ZETIA) 10 MG tablet   amLODipine (NORVASC) 2.5 MG tablet   Other Relevant Orders   Lipid panel   Comprehensive metabolic panel   Hemoglobin A1c   Essential hypertension (Chronic)    He stopped taking lisinopril because of issues of kidney problems in the past. His renal function has improved since he is not taking lisinopril anymore. He is now on, Nexium amlodipine at low-dose with HCTZ and the low dose of beta blocker. We'll monitor his blood pressure, he does have room to increase both the Toprol and amlodipine.      Relevant Medications   hydrochlorothiazide (HYDRODIURIL) 12.5 MG tablet   ezetimibe (ZETIA) 10 MG tablet   amLODipine (NORVASC) 2.5 MG tablet   Other Relevant Orders   Lipid panel   Comprehensive metabolic panel   Hemoglobin A1c   Diabetes mellitus type II, non insulin dependent (HCC) (Chronic)    He needs a PCP locally. He is on metformin  along, but has not had evaluation for quite some time. We will go ahead and order a hemoglobin A1c with his next set of labs that will be due in January. We advised him on searching the [facility] system for local PCPs.      Relevant Orders   Lipid panel   Comprehensive metabolic panel   Hemoglobin A1c   Dyslipidemia, goal LDL below 70 (Chronic)    He is on pravastatin 80 mg and recently had Zetia started by [employer]. Since this was just started, I would like to recheck in January along with his hemoglobin A1c as well as chemistry panel.      Relevant Medications   hydrochlorothiazide (HYDRODIURIL) 12.5 MG tablet   ezetimibe (ZETIA) 10 MG tablet   amLODipine (NORVASC) 2.5 MG tablet   Other Relevant Orders   Lipid panel   Comprehensive metabolic panel   Hemoglobin A1c   Coronary artery disease involving autologous vein bypass graft   Relevant Medications   hydrochlorothiazide (HYDRODIURIL) 12.5 MG tablet   ezetimibe (ZETIA) 10 MG tablet   amLODipine (NORVASC) 2.5 MG tablet   Other Relevant Orders   Lipid panel   Comprehensive metabolic panel   Hemoglobin A1c      Current medicines are reviewed at length with the patient today. (+/- concerns) n/a The following changes have been made: none  Patient Instructions  Please have labs done Jan 2018-- cmp lipids, hgb A1c  Medication refilled  Your physician recommends that you schedule a follow-up appointment in: FEB- MARCH 2018 WITH DR Milburn.  If you need a refill on your cardiac medications before your next appointment, please call your pharmacy.    Studies Ordered:   Orders Placed This Encounter  Procedures  . Lipid panel  . Comprehensive metabolic panel  . Hemoglobin A1c      Brandon Vasquez  Ellyn Hack, M.D., M.S. Interventional Cardiologist   Pager # [phone] Phone # [phone] [address] [place], [place] [address]

## 2016-07-11 ENCOUNTER — Encounter: Payer: Self-pay | Admitting: Cardiology

## 2016-07-11 ENCOUNTER — Ambulatory Visit (INDEPENDENT_AMBULATORY_CARE_PROVIDER_SITE_OTHER): Payer: Medicare Other | Admitting: Cardiology

## 2016-07-11 VITALS — BP 137/82 | HR 65 | Ht 73.0 in | Wt 182.6 lb

## 2016-07-11 DIAGNOSIS — I25119 Atherosclerotic heart disease of native coronary artery with unspecified angina pectoris: Secondary | ICD-10-CM | POA: Diagnosis not present

## 2016-07-11 DIAGNOSIS — I209 Angina pectoris, unspecified: Secondary | ICD-10-CM

## 2016-07-11 DIAGNOSIS — E785 Hyperlipidemia, unspecified: Secondary | ICD-10-CM

## 2016-07-11 DIAGNOSIS — I10 Essential (primary) hypertension: Secondary | ICD-10-CM

## 2016-07-11 DIAGNOSIS — E119 Type 2 diabetes mellitus without complications: Secondary | ICD-10-CM | POA: Diagnosis not present

## 2016-07-11 DIAGNOSIS — Z951 Presence of aortocoronary bypass graft: Secondary | ICD-10-CM

## 2016-07-11 DIAGNOSIS — I25810 Atherosclerosis of coronary artery bypass graft(s) without angina pectoris: Secondary | ICD-10-CM | POA: Diagnosis not present

## 2016-07-11 DIAGNOSIS — I25718 Atherosclerosis of autologous vein coronary artery bypass graft(s) with other forms of angina pectoris: Secondary | ICD-10-CM

## 2016-07-11 MED ORDER — HYDROCHLOROTHIAZIDE 12.5 MG PO TABS: 12.5000 mg | ORAL_TABLET | Freq: Every day | ORAL | 3 refills | Status: DC

## 2016-07-11 MED ORDER — AMLODIPINE BESYLATE 2.5 MG PO TABS: 2.5000 mg | ORAL_TABLET | Freq: Every day | ORAL | 3 refills | Status: DC

## 2016-07-11 MED ORDER — EZETIMIBE 10 MG PO TABS: 10.0000 mg | ORAL_TABLET | Freq: Every day | ORAL | 3 refills | Status: DC

## 2016-07-11 NOTE — Assessment & Plan Note (Signed)
He needs a PCP locally. He is on metformin along, but has not had evaluation for quite some time. We will go ahead and order a hemoglobin A1c with his next set of labs that will be due in January. We advised him on searching the [facility] system for local PCPs.

## 2016-07-11 NOTE — Assessment & Plan Note (Signed)
He has significant native coronary disease with patent grafts now with a stent in one of the vein grafts. He does have the ostial circumflex lesion, but is not actively having anginal symptoms. In light of him not having symptoms, I'm not inclined necessarily to pursue ischemic evaluation at this point. He will need relatively close follow-up with stress testing. Now on aspirin plus Brilinta with no bleeding issues. On low-dose Toprol along with his statin. He is taking amlodipine as opposed to lisinopril because of issues with hypotension.

## 2016-07-11 NOTE — Patient Instructions (Signed)
Please have labs done Jan 2018-- cmp lipids, hgb A1c  Medication refilled  Your physician recommends that you schedule a follow-up appointment in: FEB- MARCH 2018 WITH DR [place].  If you need a refill on your cardiac medications before your next appointment, please call your pharmacy.

## 2016-07-11 NOTE — Assessment & Plan Note (Signed)
He is on pravastatin 80 mg and recently had Zetia started by [employer]. Since this was just started, I would like to recheck in January along with his hemoglobin A1c as well as chemistry panel.

## 2016-07-11 NOTE — Assessment & Plan Note (Signed)
He stopped taking lisinopril because of issues of kidney problems in the past. His renal function has improved since he is not taking lisinopril anymore. He is now on, Nexium amlodipine at low-dose with HCTZ and the low dose of beta blocker. We'll monitor his blood pressure, he does have room to increase both the Toprol and amlodipine.

## 2016-08-15 ENCOUNTER — Telehealth: Payer: Self-pay | Admitting: *Deleted

## 2016-08-15 DIAGNOSIS — I25718 Atherosclerosis of autologous vein coronary artery bypass graft(s) with other forms of angina pectoris: Secondary | ICD-10-CM

## 2016-08-15 DIAGNOSIS — E119 Type 2 diabetes mellitus without complications: Secondary | ICD-10-CM

## 2016-08-15 DIAGNOSIS — Z951 Presence of aortocoronary bypass graft: Secondary | ICD-10-CM

## 2016-08-15 DIAGNOSIS — I25119 Atherosclerotic heart disease of native coronary artery with unspecified angina pectoris: Secondary | ICD-10-CM

## 2016-08-15 DIAGNOSIS — E785 Hyperlipidemia, unspecified: Secondary | ICD-10-CM

## 2016-08-15 DIAGNOSIS — I10 Essential (primary) hypertension: Secondary | ICD-10-CM

## 2016-08-15 NOTE — Telephone Encounter (Signed)
MAILED LETTER AND LAB SLIP FOR JAN 2018

## 2016-08-15 NOTE — Telephone Encounter (Signed)
-----   Message from Raiford Simmonds, RN sent at 07/11/2016 11:36 AM EST ----- MAIL LAB SLIP DEC - JAN 2018  CMP, LIPID, Hgb A1C  DUE Sep 10 2016

## 2016-09-22 DIAGNOSIS — E7800 Pure hypercholesterolemia, unspecified: Secondary | ICD-10-CM | POA: Diagnosis not present

## 2016-09-22 DIAGNOSIS — E1165 Type 2 diabetes mellitus with hyperglycemia: Secondary | ICD-10-CM | POA: Diagnosis not present

## 2016-09-22 DIAGNOSIS — I2510 Atherosclerotic heart disease of native coronary artery without angina pectoris: Secondary | ICD-10-CM | POA: Diagnosis not present

## 2016-09-22 DIAGNOSIS — N189 Chronic kidney disease, unspecified: Secondary | ICD-10-CM | POA: Diagnosis not present

## 2016-09-22 DIAGNOSIS — I10 Essential (primary) hypertension: Secondary | ICD-10-CM | POA: Diagnosis not present

## 2016-10-15 ENCOUNTER — Other Ambulatory Visit: Payer: Self-pay | Admitting: Pharmacist Clinician (PhC)/ Clinical Pharmacy Specialist

## 2016-10-16 NOTE — Telephone Encounter (Signed)
Rx(s) sent to pharmacy electronically.  

## 2016-10-23 ENCOUNTER — Encounter: Payer: Self-pay | Admitting: Cardiology

## 2016-10-23 ENCOUNTER — Ambulatory Visit (INDEPENDENT_AMBULATORY_CARE_PROVIDER_SITE_OTHER): Payer: Medicare Other | Admitting: Cardiology

## 2016-10-23 VITALS — BP 138/76 | HR 64 | Ht 73.0 in | Wt 183.0 lb

## 2016-10-23 DIAGNOSIS — I214 Non-ST elevation (NSTEMI) myocardial infarction: Secondary | ICD-10-CM | POA: Diagnosis not present

## 2016-10-23 DIAGNOSIS — I25718 Atherosclerosis of autologous vein coronary artery bypass graft(s) with other forms of angina pectoris: Secondary | ICD-10-CM

## 2016-10-23 DIAGNOSIS — I25119 Atherosclerotic heart disease of native coronary artery with unspecified angina pectoris: Secondary | ICD-10-CM | POA: Diagnosis not present

## 2016-10-23 DIAGNOSIS — E785 Hyperlipidemia, unspecified: Secondary | ICD-10-CM

## 2016-10-23 DIAGNOSIS — I10 Essential (primary) hypertension: Secondary | ICD-10-CM

## 2016-10-23 NOTE — Progress Notes (Signed)
PCP: No PCP Per Patient - in [place] (has yet to set PCP locally)  Clinic Note: Chief Complaint  Patient presents with  . Follow-up    no complaints   . Coronary Artery Disease    CABG-non-STEMI-PCI    HPI: Brandon Vasquez is a 71 y.o. male with a PMH below who presents today for post hospital follow-up after presenting with mild non-ST elevation MI --> this was in the setting of the accelerated hypertension.  He has a history of multivessel coronary artery disease status post CABG 4 (LIMA-LAD {could be to D1-L, SVG-RPDA, SVG-OM1-OM 2) in 2001 (in [place])--> after presenting with cardiac arrest and emergent catheterization --> NSTEMI IN 04/2016 -> PCI to SVG-RCA, med Rx of Native Cx disease. - He also has hypertension, hyperlipidemia and type 2 diabetes mellitus - Was converted from lisinopril to amlodipine plus HCTZ for hypotension/dizziness  Lennis Caldeira was last seen on 07/11/2016. He was doing quite well at that time without any major symptoms. No recurrent anginal symptoms.  Recent Hospitalizations: n/a  Studies Reviewed: N/A  Interval History: Brandon Vasquez presents today again feeling quite well. He is now back to doing his routine exercises. He does stationary bicycle as well as treadmill. He does weights. He denies also does yardwork mowing lawns and on jobs/chores. Stays active and denies any resting or exertional chest tightness or pressure. No dyspnea. Feels back to his baseline. Denies any heart failure symptoms of PND, orthopnea or edema.  . No palpitations, lightheadedness, dizziness, weakness or syncope/near syncope. No TIA/amaurosis fugax symptoms. No melena, hematochezia, hematuria, or epstaxis. No claudication - he actually has leg cramping that happens more at night, or rest.  ROS: A comprehensive was performed. Review of Systems  Constitutional: Positive for weight loss (un-intentional).  HENT: Negative for congestion and nosebleeds.   Eyes: Negative.   Respiratory:  Negative for cough, shortness of breath (Only if he really exerts himself) and wheezing.   Cardiovascular:       Per history of present illness  Gastrointestinal: Negative for abdominal pain, blood in stool, diarrhea (started off as loose stool after eating - but more solid now.), heartburn, melena and nausea.       Has BM shortly after eating.   Genitourinary: Negative for hematuria.  Musculoskeletal: Positive for joint pain (Mild arthritis her aches and pains).  Skin: Negative.   Neurological: Negative.  Negative for dizziness and focal weakness.  Psychiatric/Behavioral: Negative.  Negative for depression and memory loss. The patient is not nervous/anxious and does not have insomnia.     Past Medical History:  Diagnosis Date  . Coronary artery disease involving native heart with angina pectoris [facility]) 2005   a. 2005: CABG x5V in [place]  b. 04/2016: PCI/DES to SVG--> RCA. He also had severe ostial native circumflex with the distal circumflex not being protected by grafts due to occlusion of both OM branches.   . Essential hypertension   . HCV (hepatitis C virus)   . HLD (hyperlipidemia)   . Hx of CABG 2001   [place] - CABG 4: LIMA-LAD, SeqSVG-OM1-OM2,SVG-rPDA  . Non-ST elevation MI (NSTEMI) ([place]) 04/2016   Culprit lesion was 90% SVG-RPDA -> DES PCI. Also noted 80% ostial native circumflex with distal circumflex potentially in jeopardy -> MEDICAL Management.  . Type 2 diabetes mellitus with complication (HCC)    CAD    Past Surgical History:  Procedure Laterality Date  . CARDIAC CATHETERIZATION N/A 04/23/2016   Procedure: Left Heart Cath and Cors/Grafts Angiography;  Surgeon: Jettie Booze, MD;  Location: [facility];  Service: Cardiovascular: ostLAD 75%->pLAD 100%, ostOM1 & OM2 100%, pRCA 80% -> dRCA 50%; LIMA-LAD patent, SVG-OM1-OM2 ~20% prox otw patentt, ost-pSVG-rPDA 90% --> PCI  . CARDIAC CATHETERIZATION N/A 04/23/2016   Procedure: Coronary Stent Intervention;   Surgeon: Jettie Booze, MD;  Location: [facility];  Service: Cardiovascular;  Laterality: N/A;  DES PCI - 90% pSVG- PDA (Synergy DES 3.5 x 20)  . CORONARY ARTERY BYPASS GRAFT  2001   LIMA-LAD, SeqSVG-OM1-OM2,SVG-rPDA  . TRANSTHORACIC ECHOCARDIOGRAM  04/2016   EF 60-65%. GR 2 DD. Inferior hypokinesis. No significant valvular lesions    CATH 04/2016: Diagnostic Diagram: EF 55-65%   Post-Intervention Diagram -     SVG-PDA: STENT SYNERGY DES 3.5X20 drug eluting stent was successfully placed and dilated to 4 mm          Current Meds  Medication Sig  . amLODipine (NORVASC) 2.5 MG tablet Take 1 tablet (2.5 mg total) by mouth daily.  Marland Kitchen aspirin EC 81 MG tablet Take 81 mg by mouth daily.  . calcium carbonate (CALCIUM-CARB 600) 600 MG TABS tablet Take 2 tablets by mouth daily.  Marland Kitchen ezetimibe (ZETIA) 10 MG tablet Take 10 mg by mouth daily.  . hydrochlorothiazide (HYDRODIURIL) 12.5 MG tablet Take 1 tablet (12.5 mg total) by mouth daily.  Marland Kitchen lisinopril (PRINIVIL,ZESTRIL) 20 MG tablet Take 1 tablet (20 mg total) by mouth daily.  . metFORMIN (GLUCOPHAGE-XR) 500 MG 24 hr tablet Take 1 tablet by mouth daily.  . metoprolol succinate (TOPROL-XL) 25 MG 24 hr tablet Take 0.5 tablets by mouth daily.  . nitroGLYCERIN (NITROSTAT) 0.4 MG SL tablet Place 1 tablet (0.4 mg total) under the tongue every 5 (five) minutes x 3 doses as needed for chest pain.  . Omega-3 Fatty Acids (FISH OIL) 1000 MG CAPS Take 2 capsules by mouth daily.  . pravastatin (PRAVACHOL) 80 MG tablet Take 1 tablet (80 mg total) by mouth daily.  . ticagrelor (BRILINTA) 90 MG TABS tablet Take 1 tablet (90 mg total) by mouth 2 (two) times daily.    Not on File  Social History   Social History  . Marital status: Married    Spouse name: N/A  . Number of children: N/A  . Years of education: N/A   Social History Main Topics  . Smoking status: Former Smoker    Packs/day: 1.00    Years: 30.00    Types: Cigarettes    Quit date:  07/10/1996  . Smokeless tobacco: Never Used  . Alcohol use No  . Drug use: Yes    Types: Marijuana  . Sexual activity: Not Asked   Other Topics Concern  . None   Social History Narrative  . None    Family History  Problem Relation Age of Onset  . Hypertension Mother   . Cerebral aneurysm Mother     Died in her 70s  . Alzheimer's disease Father     Wt Readings from Last 3 Encounters:  10/23/16 83 kg (183 lb)  07/11/16 82.8 kg (182 lb 9.6 oz)  05/09/16 83.2 kg (183 lb 6.4 oz)    PHYSICAL EXAM BP 138/76   Pulse 64   Ht 6\' 1"  (1.854 m)   Wt 83 kg (183 lb)   BMI 24.14 kg/m  General appearance: alert, cooperative, appears stated age, no distress and Well-nourished, well-groomed. HEENT: [place]/AT, EOMI, MMM, anicteric sclera Neck: no adenopathy, no carotid bruit and no JVD Lungs: CTAB,  normal percussion bilaterally and non-labored Heart: RRR, S1& S2 normal, no murmur, click, rub or gallop ; non-displaced PMI Abdomen: soft, non-tender; bowel sounds normal; no masses,  no organomegaly;  Extremities: extremities normal, atraumatic, no cyanosis, or edema Pulses: 2+ and symmetric; no bruits Skin: mobility and turgor normal, no evidence of bleeding or bruising and no lesions noted  Neurologic: Mental status: Alert, oriented, thought content appropriate   Adult ECG Report N/A  Other studies Reviewed: Additional studies/ records that were reviewed today include:  Recent Labs:   Lab Results  Component Value Date   CHOL 162 04/22/2016   HDL 53 04/22/2016   LDLCALC 95 04/22/2016   TRIG 69 04/22/2016   CHOLHDL 3.1 04/22/2016    ASSESSMENT / PLAN: Problem List Items Addressed This Visit    Coronary artery disease involving autologous vein bypass graft (Chronic)    LIMA to LAD and sequential graft to the OM branches were patent, but had significant disease in the RCA graft treated with a DES stent. The remainder the RCA graft looked pretty normal. Would be due for follow-up  stress test in 2021 unless symptoms recur.      Relevant Medications   aspirin EC 81 MG tablet   ezetimibe (ZETIA) 10 MG tablet   Other Relevant Orders   Comprehensive metabolic panel   Coronary artery disease involving native heart with angina pectoris (HCC) - Primary (Chronic)    Significant native CAD. He does have disease in the ostial and distal portion of the circumflex there were not protected by grafts because the OM branches are actually occluded but no retrograde flow. At present with no active anginal symptoms, plan would be to continue medical management.  He is on aspirin plus Brilinta for his stent. He is on low-dose Toprol. Was not able tolerate ACE inhibitor but is now on amlodipine for blood pressure and angina. On pravastatin.      Relevant Medications   aspirin EC 81 MG tablet   ezetimibe (ZETIA) 10 MG tablet   Other Relevant Orders   Comprehensive metabolic panel   Dyslipidemia, goal LDL below 70 (Chronic)    He is on high-dose pravastatin plus Zetia. Was supposed to have labs checked in January that were not done. He says he thinks he was checked by his PCP, but I don't have those labs (his PCP is actually still in [place]). I will recommend that we go ahead and get labs checked now.      Relevant Medications   aspirin EC 81 MG tablet   ezetimibe (ZETIA) 10 MG tablet   Other Relevant Orders   Comprehensive metabolic panel   Lipid panel   Essential hypertension (Chronic)    Actually has been pretty well-controlled, and did not tolerate lisinopril. He is now on low-dose beta blocker (which I really can titrate further with a resting heart rate 64) along with amlodipine and HCTZ. If his blood pressure continues to be in the current range, would probably increase his amlodipine to 5 mg.      Relevant Medications   aspirin EC 81 MG tablet   ezetimibe (ZETIA) 10 MG tablet   Other Relevant Orders   Comprehensive metabolic panel   NSTEMI (non-ST elevated  myocardial infarction) ([place]) (Chronic)    He had a non-STEMI in the setting of significant pain graft disease. Prior to this he had cardiac arrest leading to his CABG in 2001. He did relatively well until his last non-STEMI. No recurrent anginal symptoms. No heart  failure symptoms.      Relevant Medications   aspirin EC 81 MG tablet   ezetimibe (ZETIA) 10 MG tablet   Other Relevant Orders   Comprehensive metabolic panel      Current medicines are reviewed at length with the patient today. (+/- concerns) n/a The following changes have been made: none  Patient Instructions  No medication changes     LABS - CMP, LIPID - DO NOT EAT OR DRINK THE MORNING OF THE TEST      Your physician wants you to follow-up in AUG 2018 WITH DR Aniyla Harling.You will receive a reminder letter in the mail two months in advance. If you don't receive a letter, please call our office to schedule the follow-up appointment.     If you need a refill on your cardiac medications before your next appointment, please call your pharmacy.   Studies Ordered:   Orders Placed This Encounter  Procedures  . Comprehensive metabolic panel  . Lipid panel      [name], M.D., M.S. Interventional Cardiologist   Pager # [phone] Phone # [phone] [address] [place], [place] [address]

## 2016-10-23 NOTE — Patient Instructions (Addendum)
No medication changes   LABS - CMP, LIPID - DO NOT EAT OR DRINK THE MORNING OF THE TEST  Your physician wants you to follow-up in AUG 2018 [place].You will receive a reminder letter in the mail two months in advance. If you don't receive a letter, please call our office to schedule the follow-up appointment.   If you need a refill on your cardiac medications before your next appointment, please call your pharmacy.

## 2016-10-25 ENCOUNTER — Encounter: Payer: Self-pay | Admitting: Cardiology

## 2016-10-25 DIAGNOSIS — E7800 Pure hypercholesterolemia, unspecified: Secondary | ICD-10-CM | POA: Diagnosis not present

## 2016-10-25 DIAGNOSIS — N189 Chronic kidney disease, unspecified: Secondary | ICD-10-CM | POA: Diagnosis not present

## 2016-10-25 DIAGNOSIS — E1165 Type 2 diabetes mellitus with hyperglycemia: Secondary | ICD-10-CM | POA: Diagnosis not present

## 2016-10-25 DIAGNOSIS — I10 Essential (primary) hypertension: Secondary | ICD-10-CM | POA: Diagnosis not present

## 2016-10-25 DIAGNOSIS — I2510 Atherosclerotic heart disease of native coronary artery without angina pectoris: Secondary | ICD-10-CM | POA: Diagnosis not present

## 2016-10-25 NOTE — Assessment & Plan Note (Signed)
LIMA to LAD and sequential graft to the OM branches were patent, but had significant disease in the RCA graft treated with a DES stent. The remainder the RCA graft looked pretty normal. Would be due for follow-up stress test in 2021 unless symptoms recur.

## 2016-10-25 NOTE — Assessment & Plan Note (Signed)
He is on high-dose pravastatin plus Zetia. Was supposed to have labs checked in January that were not done. He says he thinks he was checked by his PCP, but I don't have those labs (his PCP is actually still in [place]). I will recommend that we go ahead and get labs checked now.

## 2016-10-25 NOTE — Assessment & Plan Note (Signed)
He had a non-STEMI in the setting of significant pain graft disease. Prior to this he had cardiac arrest leading to his CABG in 2001. He did relatively well until his last non-STEMI. No recurrent anginal symptoms. No heart failure symptoms.

## 2016-10-25 NOTE — Assessment & Plan Note (Signed)
Significant native CAD. He does have disease in the ostial and distal portion of the circumflex there were not protected by grafts because the OM branches are actually occluded but no retrograde flow. At present with no active anginal symptoms, plan would be to continue medical management.  He is on aspirin plus Brilinta for his stent. He is on low-dose Toprol. Was not able tolerate ACE inhibitor but is now on amlodipine for blood pressure and angina. On pravastatin.

## 2016-10-25 NOTE — Assessment & Plan Note (Signed)
Actually has been pretty well-controlled, and did not tolerate lisinopril. He is now on low-dose beta blocker (which I really can titrate further with a resting heart rate 64) along with amlodipine and HCTZ. If his blood pressure continues to be in the current range, would probably increase his amlodipine to 5 mg.

## 2016-11-14 DIAGNOSIS — E1165 Type 2 diabetes mellitus with hyperglycemia: Secondary | ICD-10-CM | POA: Diagnosis not present

## 2016-11-14 DIAGNOSIS — N189 Chronic kidney disease, unspecified: Secondary | ICD-10-CM | POA: Diagnosis not present

## 2016-11-14 DIAGNOSIS — E7800 Pure hypercholesterolemia, unspecified: Secondary | ICD-10-CM | POA: Diagnosis not present

## 2016-11-14 DIAGNOSIS — I10 Essential (primary) hypertension: Secondary | ICD-10-CM | POA: Diagnosis not present

## 2016-11-14 DIAGNOSIS — I2510 Atherosclerotic heart disease of native coronary artery without angina pectoris: Secondary | ICD-10-CM | POA: Diagnosis not present

## 2016-12-10 DIAGNOSIS — E1165 Type 2 diabetes mellitus with hyperglycemia: Secondary | ICD-10-CM | POA: Diagnosis not present

## 2016-12-10 DIAGNOSIS — E7800 Pure hypercholesterolemia, unspecified: Secondary | ICD-10-CM | POA: Diagnosis not present

## 2016-12-10 DIAGNOSIS — I10 Essential (primary) hypertension: Secondary | ICD-10-CM | POA: Diagnosis not present

## 2016-12-10 DIAGNOSIS — I2510 Atherosclerotic heart disease of native coronary artery without angina pectoris: Secondary | ICD-10-CM | POA: Diagnosis not present

## 2016-12-10 DIAGNOSIS — N189 Chronic kidney disease, unspecified: Secondary | ICD-10-CM | POA: Diagnosis not present

## 2016-12-12 DIAGNOSIS — I25718 Atherosclerosis of autologous vein coronary artery bypass graft(s) with other forms of angina pectoris: Secondary | ICD-10-CM | POA: Diagnosis not present

## 2016-12-12 DIAGNOSIS — I214 Non-ST elevation (NSTEMI) myocardial infarction: Secondary | ICD-10-CM | POA: Diagnosis not present

## 2016-12-12 DIAGNOSIS — I25119 Atherosclerotic heart disease of native coronary artery with unspecified angina pectoris: Secondary | ICD-10-CM | POA: Diagnosis not present

## 2016-12-12 DIAGNOSIS — I10 Essential (primary) hypertension: Secondary | ICD-10-CM | POA: Diagnosis not present

## 2016-12-12 DIAGNOSIS — E785 Hyperlipidemia, unspecified: Secondary | ICD-10-CM | POA: Diagnosis not present

## 2016-12-13 LAB — COMPREHENSIVE METABOLIC PANEL
ALBUMIN: 4.1 g/dL (ref 3.6–5.1)
ALK PHOS: 57 U/L (ref 40–115)
ALT: 15 U/L (ref 9–46)
AST: 19 U/L (ref 10–35)
BILIRUBIN TOTAL: 0.3 mg/dL (ref 0.2–1.2)
BUN: 21 mg/dL (ref 7–25)
CALCIUM: 9.5 mg/dL (ref 8.6–10.3)
CO2: 27 mmol/L (ref 20–31)
Chloride: 106 mmol/L (ref 98–110)
Creat: 1.22 mg/dL (ref 0.70–1.25)
Glucose, Bld: 94 mg/dL (ref 65–99)
POTASSIUM: 4.3 mmol/L (ref 3.5–5.3)
Sodium: 140 mmol/L (ref 135–146)
Total Protein: 7.1 g/dL (ref 6.1–8.1)

## 2016-12-13 LAB — LIPID PANEL
Cholesterol: 147 mg/dL (ref <200)
HDL: 58 mg/dL (ref >40)
LDL Cholesterol: 73 mg/dL (ref <100)
TRIGLYCERIDES: 82 mg/dL (ref <150)
Total CHOL/HDL Ratio: 2.5 Ratio (ref <5.0)
VLDL: 16 mg/dL (ref <30)

## 2016-12-18 ENCOUNTER — Encounter: Payer: Self-pay | Admitting: *Deleted

## 2016-12-21 ENCOUNTER — Other Ambulatory Visit: Payer: Self-pay | Admitting: Physician Assistant

## 2016-12-21 DIAGNOSIS — H1712 Central corneal opacity, left eye: Secondary | ICD-10-CM | POA: Diagnosis not present

## 2016-12-21 DIAGNOSIS — H40013 Open angle with borderline findings, low risk, bilateral: Secondary | ICD-10-CM | POA: Diagnosis not present

## 2016-12-21 DIAGNOSIS — H2513 Age-related nuclear cataract, bilateral: Secondary | ICD-10-CM | POA: Diagnosis not present

## 2016-12-21 DIAGNOSIS — H16143 Punctate keratitis, bilateral: Secondary | ICD-10-CM | POA: Diagnosis not present

## 2016-12-21 DIAGNOSIS — H04123 Dry eye syndrome of bilateral lacrimal glands: Secondary | ICD-10-CM | POA: Diagnosis not present

## 2016-12-21 DIAGNOSIS — E119 Type 2 diabetes mellitus without complications: Secondary | ICD-10-CM | POA: Diagnosis not present

## 2017-01-03 DIAGNOSIS — M216X1 Other acquired deformities of right foot: Secondary | ICD-10-CM | POA: Diagnosis not present

## 2017-01-03 DIAGNOSIS — E119 Type 2 diabetes mellitus without complications: Secondary | ICD-10-CM | POA: Diagnosis not present

## 2017-01-03 DIAGNOSIS — M2042 Other hammer toe(s) (acquired), left foot: Secondary | ICD-10-CM | POA: Diagnosis not present

## 2017-01-23 ENCOUNTER — Telehealth: Payer: Self-pay | Admitting: Cardiology

## 2017-01-23 NOTE — Telephone Encounter (Signed)
So - by that time, he will be 9 months out from his stent - Probably ok holding Brilinta if surgery is urgent - but would be better to reschedule for July-Aug time frame - would be OK to hold Brilinta x 5 days @ that time.  [name], MD

## 2017-01-23 NOTE — Telephone Encounter (Signed)
Request for surgical clearance:  1. What type of surgery is being performed?Hammer toe Fusion Left 4th toe and Hammer toe Correction of left 5th toe  2. When is this surgery scheduled? 01/24/17  3. Are there any medications that need to be held prior to surgery and how long?Not Sure   4. Name of physician performing surgery?[place]   5. What is your office phone and fax number? Office no#[phone], Fax no# [phone]    Called Melody @ [facility] Patient is on Brilinta & ASA. She states patient did not inform them he was on [place] patient had cath + stent 04/2016 and is recommended on be on dual anti-platelet therapy for 1 year She states she will cancel surgery for 5/24 (elective) but will need clearance for future surgery date Advised will route to MD/RN

## 2017-01-24 NOTE — Telephone Encounter (Signed)
Clearance sent via EPIC 

## 2017-02-01 DIAGNOSIS — E7800 Pure hypercholesterolemia, unspecified: Secondary | ICD-10-CM | POA: Diagnosis not present

## 2017-02-01 DIAGNOSIS — N189 Chronic kidney disease, unspecified: Secondary | ICD-10-CM | POA: Diagnosis not present

## 2017-02-01 DIAGNOSIS — I10 Essential (primary) hypertension: Secondary | ICD-10-CM | POA: Diagnosis not present

## 2017-02-01 DIAGNOSIS — I2510 Atherosclerotic heart disease of native coronary artery without angina pectoris: Secondary | ICD-10-CM | POA: Diagnosis not present

## 2017-02-01 DIAGNOSIS — E1165 Type 2 diabetes mellitus with hyperglycemia: Secondary | ICD-10-CM | POA: Diagnosis not present

## 2017-02-11 ENCOUNTER — Ambulatory Visit: Payer: Medicare Other | Admitting: Family Medicine

## 2017-02-11 ENCOUNTER — Telehealth: Payer: Self-pay

## 2017-02-11 NOTE — Telephone Encounter (Signed)
Patient called [facility] on 02/10/17 to see what his appointment on 02/11/17 was for. Copy of [facility] note given to Dr. Serita Sheller' Nurse.  Patient was no show today.

## 2017-02-11 NOTE — Telephone Encounter (Signed)
Called patient advised he was scheduled to be seen as a new patient today. Advised to call back and re-schedule if he would like.

## 2017-03-27 ENCOUNTER — Emergency Department (HOSPITAL_COMMUNITY)
Admission: EM | Admit: 2017-03-27 | Discharge: 2017-03-28 | Disposition: A | Discharge status: Home / Self Care | Payer: Medicare Other | Attending: Emergency Medicine | Admitting: Emergency Medicine

## 2017-03-27 DIAGNOSIS — Z951 Presence of aortocoronary bypass graft: Secondary | ICD-10-CM | POA: Insufficient documentation

## 2017-03-27 DIAGNOSIS — I119 Hypertensive heart disease without heart failure: Secondary | ICD-10-CM | POA: Diagnosis not present

## 2017-03-27 DIAGNOSIS — Y929 Unspecified place or not applicable: Secondary | ICD-10-CM | POA: Insufficient documentation

## 2017-03-27 DIAGNOSIS — Y998 Other external cause status: Secondary | ICD-10-CM | POA: Insufficient documentation

## 2017-03-27 DIAGNOSIS — Z87891 Personal history of nicotine dependence: Secondary | ICD-10-CM | POA: Diagnosis not present

## 2017-03-27 DIAGNOSIS — E119 Type 2 diabetes mellitus without complications: Secondary | ICD-10-CM | POA: Diagnosis not present

## 2017-03-27 DIAGNOSIS — Y9389 Activity, other specified: Secondary | ICD-10-CM | POA: Diagnosis not present

## 2017-03-27 DIAGNOSIS — I252 Old myocardial infarction: Secondary | ICD-10-CM | POA: Insufficient documentation

## 2017-03-27 DIAGNOSIS — S62639B Displaced fracture of distal phalanx of unspecified finger, initial encounter for open fracture: Secondary | ICD-10-CM | POA: Insufficient documentation

## 2017-03-27 DIAGNOSIS — S62631B Displaced fracture of distal phalanx of left index finger, initial encounter for open fracture: Secondary | ICD-10-CM | POA: Diagnosis not present

## 2017-03-27 DIAGNOSIS — W320XXA Accidental handgun discharge, initial encounter: Secondary | ICD-10-CM | POA: Diagnosis not present

## 2017-03-27 DIAGNOSIS — Z7984 Long term (current) use of oral hypoglycemic drugs: Secondary | ICD-10-CM | POA: Insufficient documentation

## 2017-03-27 DIAGNOSIS — Z23 Encounter for immunization: Secondary | ICD-10-CM | POA: Insufficient documentation

## 2017-03-27 DIAGNOSIS — R52 Pain, unspecified: Secondary | ICD-10-CM | POA: Diagnosis not present

## 2017-03-27 DIAGNOSIS — S62631A Displaced fracture of distal phalanx of left index finger, initial encounter for closed fracture: Secondary | ICD-10-CM | POA: Diagnosis not present

## 2017-03-27 DIAGNOSIS — I2510 Atherosclerotic heart disease of native coronary artery without angina pectoris: Secondary | ICD-10-CM | POA: Diagnosis not present

## 2017-03-27 DIAGNOSIS — M79642 Pain in left hand: Secondary | ICD-10-CM | POA: Diagnosis not present

## 2017-03-27 DIAGNOSIS — S6992XA Unspecified injury of left wrist, hand and finger(s), initial encounter: Secondary | ICD-10-CM | POA: Diagnosis present
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
## 2017-03-28 ENCOUNTER — Emergency Department (HOSPITAL_COMMUNITY): Payer: Medicare Other

## 2017-03-28 ENCOUNTER — Encounter (HOSPITAL_COMMUNITY): Payer: Self-pay | Admitting: Emergency Medicine

## 2017-03-28 DIAGNOSIS — S62639B Displaced fracture of distal phalanx of unspecified finger, initial encounter for open fracture: Secondary | ICD-10-CM | POA: Diagnosis not present

## 2017-03-28 DIAGNOSIS — W3400XA Accidental discharge from unspecified firearms or gun, initial encounter: Secondary | ICD-10-CM | POA: Diagnosis not present

## 2017-03-28 DIAGNOSIS — S62631B Displaced fracture of distal phalanx of left index finger, initial encounter for open fracture: Secondary | ICD-10-CM | POA: Diagnosis not present

## 2017-03-28 DIAGNOSIS — S62631A Displaced fracture of distal phalanx of left index finger, initial encounter for closed fracture: Secondary | ICD-10-CM | POA: Diagnosis not present

## 2017-03-28 LAB — CBC WITH DIFFERENTIAL/PLATELET
Basophils Absolute: 0 10*3/uL (ref 0.0–0.1)
Basophils Relative: 0 %
EOS ABS: 0.1 10*3/uL (ref 0.0–0.7)
Eosinophils Relative: 3 %
HCT: 36.7 % — ABNORMAL LOW (ref 39.0–52.0)
Hemoglobin: 12.5 g/dL — ABNORMAL LOW (ref 13.0–17.0)
LYMPHS ABS: 1.3 10*3/uL (ref 0.7–4.0)
LYMPHS PCT: 24 %
MCH: 23.9 pg — AB (ref 26.0–34.0)
MCHC: 34.1 g/dL (ref 30.0–36.0)
MCV: 70 fL — AB (ref 78.0–100.0)
MONOS PCT: 4 %
Monocytes Absolute: 0.2 10*3/uL (ref 0.1–1.0)
Neutro Abs: 3.5 10*3/uL (ref 1.7–7.7)
Neutrophils Relative %: 69 %
PLATELETS: 174 10*3/uL (ref 150–400)
RBC: 5.24 MIL/uL (ref 4.22–5.81)
RDW: 15.6 % — ABNORMAL HIGH (ref 11.5–15.5)
WBC: 5.2 10*3/uL (ref 4.0–10.5)

## 2017-03-28 LAB — I-STAT CHEM 8, ED
BUN: 19 mg/dL (ref 6–20)
CALCIUM ION: 1.11 mmol/L — AB (ref 1.15–1.40)
Chloride: 103 mmol/L (ref 101–111)
Creatinine, Ser: 1.2 mg/dL (ref 0.61–1.24)
Glucose, Bld: 96 mg/dL (ref 65–99)
HCT: 39 % (ref 39.0–52.0)
Hemoglobin: 13.3 g/dL (ref 13.0–17.0)
Potassium: 3.4 mmol/L — ABNORMAL LOW (ref 3.5–5.1)
SODIUM: 140 mmol/L (ref 135–145)
TCO2: 24 mmol/L (ref 0–100)

## 2017-03-28 MED ORDER — CEFAZOLIN SODIUM-DEXTROSE 1-4 GM/50ML-% IV SOLN
1.0000 g | Freq: Once | INTRAVENOUS | Status: AC
  Administered 2017-03-28: 1 g via INTRAVENOUS
  Filled 2017-03-28: qty 50

## 2017-03-28 MED ORDER — TETANUS-DIPHTH-ACELL PERTUSSIS 5-2.5-18.5 LF-MCG/0.5 IM SUSP
0.5000 mL | Freq: Once | INTRAMUSCULAR | Status: AC
  Administered 2017-03-28: 0.5 mL via INTRAMUSCULAR
  Filled 2017-03-28: qty 0.5

## 2017-03-28 MED ORDER — BUPIVACAINE HCL (PF) 0.5 % IJ SOLN
10.0000 mL | Freq: Once | INTRAMUSCULAR | Status: AC
  Administered 2017-03-28: 10 mL
  Filled 2017-03-28: qty 10

## 2017-03-28 MED ORDER — HYDROCODONE-ACETAMINOPHEN 5-325 MG PO TABS: 1.0000 | ORAL_TABLET | Freq: Four times a day (QID) | ORAL | 0 refills | Status: DC | PRN
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
## 2017-03-28 NOTE — Discharge Instructions (Signed)
Keep finger elevated. Please go to Dr. Lequita Asal office at [place] not eat or drink anything until then.

## 2017-04-05 DIAGNOSIS — S62631D Displaced fracture of distal phalanx of left index finger, subsequent encounter for fracture with routine healing: Secondary | ICD-10-CM | POA: Diagnosis not present

## 2017-04-10 DIAGNOSIS — S62631D Displaced fracture of distal phalanx of left index finger, subsequent encounter for fracture with routine healing: Secondary | ICD-10-CM | POA: Diagnosis not present

## 2017-04-16 DIAGNOSIS — S62631D Displaced fracture of distal phalanx of left index finger, subsequent encounter for fracture with routine healing: Secondary | ICD-10-CM | POA: Diagnosis not present

## 2017-04-19 DIAGNOSIS — S62631D Displaced fracture of distal phalanx of left index finger, subsequent encounter for fracture with routine healing: Secondary | ICD-10-CM | POA: Diagnosis not present

## 2017-04-24 DIAGNOSIS — S62631D Displaced fracture of distal phalanx of left index finger, subsequent encounter for fracture with routine healing: Secondary | ICD-10-CM | POA: Diagnosis not present

## 2017-05-02 DIAGNOSIS — M2042 Other hammer toe(s) (acquired), left foot: Secondary | ICD-10-CM | POA: Diagnosis not present

## 2017-05-02 DIAGNOSIS — E119 Type 2 diabetes mellitus without complications: Secondary | ICD-10-CM | POA: Diagnosis not present

## 2017-05-02 DIAGNOSIS — M216X1 Other acquired deformities of right foot: Secondary | ICD-10-CM | POA: Diagnosis not present

## 2017-05-09 ENCOUNTER — Telehealth: Payer: Self-pay | Admitting: *Deleted

## 2017-05-09 NOTE — Telephone Encounter (Signed)
FAXED MEDICAL /CARDAIC CLEARANCE to Dr Yaakov Guthrie DPM -- [place] Toe Fusion - Left 4th, [place] TOE CORRECTION--LEFT 5TH PER DR HARDING,  HOLD BRILINTA 5-7 pre -op - restart 2-3 days post op. No medical conditions that would prevent an outpatient surgery with the use of general anesthesia.

## 2017-05-10 DIAGNOSIS — S62631D Displaced fracture of distal phalanx of left index finger, subsequent encounter for fracture with routine healing: Secondary | ICD-10-CM | POA: Diagnosis not present

## 2017-05-16 DIAGNOSIS — M2042 Other hammer toe(s) (acquired), left foot: Secondary | ICD-10-CM | POA: Diagnosis not present

## 2017-05-16 DIAGNOSIS — E7800 Pure hypercholesterolemia, unspecified: Secondary | ICD-10-CM | POA: Diagnosis not present

## 2017-05-22 DIAGNOSIS — E119 Type 2 diabetes mellitus without complications: Secondary | ICD-10-CM | POA: Diagnosis not present

## 2017-05-22 DIAGNOSIS — M2042 Other hammer toe(s) (acquired), left foot: Secondary | ICD-10-CM | POA: Diagnosis not present

## 2017-06-06 ENCOUNTER — Ambulatory Visit (INDEPENDENT_AMBULATORY_CARE_PROVIDER_SITE_OTHER): Payer: Medicare Other | Admitting: Cardiology

## 2017-06-06 ENCOUNTER — Encounter: Payer: Self-pay | Admitting: Cardiology

## 2017-06-06 VITALS — BP 142/80 | HR 58 | Ht 73.0 in | Wt 173.0 lb

## 2017-06-06 DIAGNOSIS — I214 Non-ST elevation (NSTEMI) myocardial infarction: Secondary | ICD-10-CM

## 2017-06-06 DIAGNOSIS — Z951 Presence of aortocoronary bypass graft: Secondary | ICD-10-CM

## 2017-06-06 DIAGNOSIS — I10 Essential (primary) hypertension: Secondary | ICD-10-CM | POA: Diagnosis not present

## 2017-06-06 DIAGNOSIS — Z79899 Other long term (current) drug therapy: Secondary | ICD-10-CM | POA: Diagnosis not present

## 2017-06-06 DIAGNOSIS — Z024 Encounter for examination for driving license: Secondary | ICD-10-CM | POA: Insufficient documentation

## 2017-06-06 DIAGNOSIS — I25710 Atherosclerosis of autologous vein coronary artery bypass graft(s) with unstable angina pectoris: Secondary | ICD-10-CM

## 2017-06-06 DIAGNOSIS — I25119 Atherosclerotic heart disease of native coronary artery with unspecified angina pectoris: Secondary | ICD-10-CM

## 2017-06-06 DIAGNOSIS — Z021 Encounter for pre-employment examination: Secondary | ICD-10-CM

## 2017-06-06 DIAGNOSIS — E785 Hyperlipidemia, unspecified: Secondary | ICD-10-CM

## 2017-06-06 NOTE — Patient Instructions (Signed)
NO CHANGE WITH MEDICATION    PLEASE HAVE LAB WORK DONE IN April 2019--- LIPIDS , CMP WILL MAIL LABSLIP TO YOU DO NOT EAT OR DRINK THE MORNING OF THE TEST   MAY COME BACK TO OFFICE TO HAVE LABS DONE - NO APPOINTMENT IS  NEEDED.    SCHEDULE [place] [facility] Your physician has requested that you have en exercise stress myoview. For further information please visit [email or web address]. Please follow instruction sheet, as given.    Your physician wants you to follow-up in [place].You will receive a reminder letter in the mail two months in advance. If you don't receive a letter, please call our office to schedule the follow-up appointment.

## 2017-06-06 NOTE — Progress Notes (Signed)
PCP: Patient, No Pcp Per  Clinic Note: Chief Complaint  Patient presents with  . Follow-up    6 months. No complaints.  . Employment Physical    HPI: Brandon Vasquez is a 71 y.o. 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   He also has hypertension, hyperlipidemia and type 2 diabetes mellitus - Was converted from lisinopril to amlodipine plus HCTZ for hypotension/dizziness  Brandon Vasquez was last seen on 10/23/2016 doing well.  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 Really has been doing great from a cardiac standpoint. He is active without any significant symptoms. His only issue today is that he recently lost that the tip of his left pointer finger while doing maintenance on his riding lawnmower. Apparently the blade deck fell on his finger, and he didn't get to the emergency room in time to have it successfully replaced. His also had surgery on his left toe About 3 weeks ago.   From a cardiac standpoint is doing very well with no active symptoms. He denies any chest tightness or pressure with rest or exertion. No exertional dyspnea. No PND, orthopnea or edema.  No palpitations, lightheadedness, dizziness, weakness or syncope/near syncope. No  TIA/amaurosis fugax symptoms. No melena, hematochezia, hematuria, or epstaxis. No claudication.  ROS: A comprehensive was performed. Review of Systems  Constitutional: Negative for malaise/fatigue.  HENT: Negative for nosebleeds.   Respiratory: Negative for shortness of breath.   Cardiovascular:       Negative per HPI  Gastrointestinal: Negative for heartburn.  Genitourinary: Negative for dysuria.  Musculoskeletal: Positive for joint pain (Mild arthritis pains).  Neurological: Negative for dizziness.  Psychiatric/Behavioral: Negative.   All other systems reviewed and are negative.  I have reviewed and (if needed) personally updated the patient's problem list, medications, allergies, past medical and surgical history, social and family history.   Past Medical History:  Diagnosis Date  . Coronary artery disease involving native heart with angina pectoris [facility]) 2005   a. 2005: CABG x5V in [place]  b. 04/2016: PCI/DES to SVG--> RCA. He also had severe ostial native circumflex with the distal circumflex not being protected by grafts due to occlusion of both OM branches.   . Essential hypertension   . HCV (hepatitis C virus)   . HLD (hyperlipidemia)   . Hx of CABG 2001   [place] - CABG 4: LIMA-LAD, SeqSVG-OM1-OM2,SVG-rPDA  . Non-ST elevation MI (NSTEMI) ([place]) 04/2016   Culprit lesion was 90% SVG-RPDA -> DES PCI. Also noted 80% ostial native circumflex with distal circumflex potentially in jeopardy -> MEDICAL Management.  . Type 2 diabetes mellitus with complication (HCC)    CAD    Past Surgical History:  Procedure Laterality Date  . CARDIAC CATHETERIZATION N/A 04/23/2016   Procedure: Left Heart Cath and  Cors/Grafts Angiography;  Surgeon: Jettie Booze, MD;  Location: [place] CV LAB;  Service: Cardiovascular: ostLAD 75%->pLAD 100%, ostOM1 & OM2 100%, pRCA 80% -> dRCA 50%; LIMA-LAD patent, SVG-OM1-OM2 ~20% prox otw patentt, ost-pSVG-rPDA 90% --> PCI  . CARDIAC  CATHETERIZATION N/A 04/23/2016   Procedure: Coronary Stent Intervention;  Surgeon: Jettie Booze, MD;  Location: [place] CV LAB;  Service: Cardiovascular;  Laterality: N/A;  DES PCI - 90% pSVG- PDA (Synergy DES 3.5 x 20)  . CORONARY ARTERY BYPASS GRAFT  2001   LIMA-LAD, SeqSVG-OM1-OM2,SVG-rPDA  . TRANSTHORACIC ECHOCARDIOGRAM  04/2016   EF 60-65%. GR 2 DD. Inferior hypokinesis. No significant valvular lesions   [place]    Diagnostic Diagram                                          Post-Intervention Diagram           Current Meds  Medication Sig  . amLODipine (NORVASC) 2.5 MG tablet Take 1 tablet (2.5 mg total) by mouth daily.  Marland Kitchen aspirin EC 81 MG tablet Take 81 mg by mouth daily.  . calcium carbonate (CALCIUM-CARB 600) 600 MG TABS tablet Take 1,200 mg by mouth daily.   Marland Kitchen ezetimibe (ZETIA) 10 MG tablet Take 10 mg by mouth daily.  . hydrochlorothiazide (HYDRODIURIL) 12.5 MG tablet Take 1 tablet (12.5 mg total) by mouth daily.  Marland Kitchen HYDROcodone-acetaminophen (NORCO) 5-325 MG tablet Take 1 tablet by mouth every 6 (six) hours as needed for moderate pain.  Marland Kitchen lisinopril (PRINIVIL,ZESTRIL) 20 MG tablet Take 1 tablet (20 mg total) by mouth daily.  . metFORMIN (GLUCOPHAGE-XR) 500 MG 24 hr tablet Take 500 mg by mouth daily.   . metoprolol succinate (TOPROL-XL) 25 MG 24 hr tablet Take 12.5 mg by mouth daily.   . nitroGLYCERIN (NITROSTAT) 0.4 MG SL tablet Place 1 tablet (0.4 mg total) under the tongue every 5 (five) minutes x 3 doses as needed for chest pain.  . pravastatin (PRAVACHOL) 80 MG tablet TAKE 1 TABLET (80 MG TOTAL) BY MOUTH DAILY.  . ticagrelor (BRILINTA) 90 MG TABS tablet Take 1 tablet (90 mg total) by mouth 2 (two) times daily.  . [DISCONTINUED] Omega-3 Fatty Acids (FISH OIL) 1000 MG CAPS Take 2 capsules by mouth daily.    No Known Allergies  Social History   Social History  . Marital status: Married    Spouse name: N/A  . Number of children: N/A  . Years of education:  N/A   Social History Main Topics  . Smoking status: Former Smoker    Packs/day: 1.00    Years: 30.00    Types: Cigarettes    Quit date: 07/10/1996  . Smokeless tobacco: Never Used  . Alcohol use No  . Drug use: Yes    Types: Marijuana  . Sexual activity: Not Asked   Other Topics Concern  . None   Social History Narrative  . None    family history includes Alzheimer's disease in his father; Cerebral aneurysm in his mother; Hypertension in his mother.  Wt Readings from Last 3 Encounters:  06/06/17 173 lb (78.5 kg)  03/28/17 171 lb (77.6 kg)  10/23/16 183 lb (83 kg)  -- lost the taste of meat!   PHYSICAL EXAM BP (!) 142/80   Pulse (!) 58   Ht 6\' 1"  (1.854 m)   Wt 173 lb (78.5 kg)  BMI 22.82 kg/m  Physical Exam  Constitutional: He is oriented to person, place, and time. He appears well-developed and well-nourished. No distress.  Healthy appearing. Well groomed.  HENT:  Head: Normocephalic and atraumatic.  Eyes: Pupils are equal, round, and reactive to light. EOM are normal.  Neck: Normal range of motion. Neck supple. No hepatojugular reflux and no JVD present. Carotid bruit is not present.  Cardiovascular: Normal rate, regular rhythm, normal heart sounds, intact distal pulses and normal pulses.   No extrasystoles are present. PMI is not displaced.  Exam reveals no gallop and no friction rub.   No murmur heard. Pulmonary/Chest: Effort normal and breath sounds normal. No respiratory distress. He has no wheezes. He has no rales.  Abdominal: Soft. Bowel sounds are normal. He exhibits no distension. There is no tenderness. There is no rebound and no guarding.  Musculoskeletal: Normal range of motion. He exhibits no edema.  L pointer finger tip missing (has protective sleeve on).  Neurological: He is alert and oriented to person, place, and time. No cranial nerve deficit.  Skin: Skin is warm and dry. No rash noted. No erythema. No pallor.  Psychiatric: His behavior is normal.  Judgment and thought content normal.  Nursing note and vitals reviewed.   Adult ECG Report  Rate: 58 ;  Rhythm: sinus bradycardia, premature ventricular contractions (PVC) and otherwise normal axis, intervals & durations;   Narrative Interpretation: Stable EKG   Other studies Reviewed: Additional studies/ records that were reviewed today include:  Recent Labs:    Lab Results  Component Value Date   CHOL 147 12/12/2016   HDL 58 12/12/2016   LDLCALC 73 12/12/2016   TRIG 82 12/12/2016   CHOLHDL 2.5 12/12/2016    ASSESSMENT / PLAN: Problem List Items Addressed This Visit    Coronary artery disease involving autologous vein bypass graft - Primary (Chronic)    Status post PCI to SVG-RCA. Since he has now shown signs of initial vein graft disease, will need to monitor for progression of disease. No active anginal symptoms, however with his pre-work physical upcoming, we will check a treadmill Myoview stress test as a baseline. 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      Relevant Orders   EKG 12-Lead   MYOCARDIAL PERFUSION IMAGING   Coronary artery disease involving native heart with angina pectoris (HCC) (Chronic)    Severe native CAD involving essentially proximal LAD,  RCA and circumflex with both OM's all with significant disease - s/p CABG x 4. By last catheterization LIMA LAD and sequential vein graft to OM 1 OM 2 were patent. He had severe disease to the osteo-proximal segment of SVG-RCA treated with DES stent. The only existing non-revascularized disease involves the downstream portion of the circumflex with 80% ostial circumflex disease.  In the absence of any significant symptoms, we have been treating this medically.  He has not had any ischemic evaluation since his PCI back in August 2017. He remains asymptomatic on stable regimen of aspirin plus Brilinta, metoprolol, lisinopril and amlodipine along with Pravachol.  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 For baseline evaluation of think we should proceed with a treadmill Myoview stress test to ensure imaging correlates with EKG. In the future, we could then potentially simply do GXT/ETT.  Plan: Treadmill Myoview Stress Test - Continue current meds      Dyslipidemia, goal LDL below 70 (  Chronic)    On high-dose pravastatin plus Zetia. Is due for labs to be checked. He was close to goal back in April.      Relevant Orders   EKG 12-Lead   Lipid panel   Comprehensive metabolic panel   Essential hypertension (Chronic)    Blood pressure looks pretty good today. A little high sec, but is usually been well controlled. Will continue current regimen.      Relevant Orders   EKG 12-Lead   Hx of CABG x 5: In 2001, in [place] (Chronic)   Relevant Orders   EKG 12-Lead   MYOCARDIAL PERFUSION IMAGING   NSTEMI (non-ST elevated myocardial infarction) (HCC) (Chronic)   Relevant Orders   EKG 12-Lead   MYOCARDIAL PERFUSION IMAGING   Pre-employment examination    No active anginal or heart failure symptoms. Plan: Based on evaluation with Treadmill Myoview - to exclude any ongoing ischemia. By last catheterization there is only one small area that could potentially be at risk.      Relevant Orders   EKG 12-Lead   MYOCARDIAL PERFUSION IMAGING    Other Visit Diagnoses    Medication management       Relevant Orders   Lipid panel   Comprehensive metabolic panel      Current medicines are reviewed at length with the patient today. (+/- concerns) n/a The following changes have been made: n/a  Patient Instructions  NO CHANGE WITH MEDICATION    PLEASE HAVE LAB WORK DONE IN April 2019--- LIPIDS , CMP WILL MAIL LABSLIP TO YOU DO NOT EAT OR DRINK THE MORNING OF THE TEST   MAY COME BACK TO OFFICE TO HAVE LABS DONE - NO APPOINTMENT IS  NEEDED.    SCHEDULE [place] 6 [facility] Your physician has requested  that you have en exercise stress myoview. For further information please visit [email or web address]. Please follow instruction sheet, as given.    Your physician wants you to follow-up in [place].You will receive a reminder letter in the mail two months in advance. If you don't receive a letter, please call our office to schedule the follow-up appointment.    Studies Ordered:   Orders Placed This Encounter  Procedures  . Lipid panel  . Comprehensive metabolic panel  . MYOCARDIAL PERFUSION IMAGING  . EKG 12-Lead      [name], M.D., M.S. Interventional Cardiologist   Pager # [phone] Phone # [phone] [address] [place], [place] [address]

## 2017-06-08 ENCOUNTER — Encounter: Payer: Self-pay | Admitting: Cardiology

## 2017-06-08 NOTE — Assessment & Plan Note (Signed)
On high-dose pravastatin plus Zetia. Is due for labs to be checked. He was close to goal back in April.

## 2017-06-08 NOTE — Assessment & Plan Note (Signed)
Blood pressure looks pretty good today. A little high sec, but is usually been well controlled. Will continue current regimen.

## 2017-06-08 NOTE — Assessment & Plan Note (Addendum)
Severe native CAD involving essentially proximal LAD,  RCA and circumflex with both OM's all with significant disease - s/p CABG x 4. By last catheterization LIMA LAD and sequential vein graft to OM 1 OM 2 were patent. He had severe disease to the osteo-proximal segment of SVG-RCA treated with DES stent. The only existing non-revascularized disease involves the downstream portion of the circumflex with 80% ostial circumflex disease.  In the absence of any significant symptoms, we have been treating this medically.  He has not had any ischemic evaluation since his PCI back in August 2017. He remains asymptomatic on stable regimen of aspirin plus Brilinta, metoprolol, lisinopril and amlodipine along with Pravachol.  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 For baseline evaluation of think we should proceed with a treadmill Myoview stress test to ensure imaging correlates with EKG. In the future, we could then potentially simply do GXT/ETT.  Plan: Treadmill Myoview Stress Test - Continue current meds

## 2017-06-08 NOTE — Assessment & Plan Note (Signed)
No active anginal or heart failure symptoms. Plan: Based on evaluation with Treadmill Myoview - to exclude any ongoing ischemia. By last catheterization there is only one small area that could potentially be at risk.

## 2017-06-08 NOTE — Assessment & Plan Note (Signed)
Status post PCI to SVG-RCA. Since he has now shown signs of initial vein graft disease, will need to monitor for progression of disease. No active anginal symptoms, however with his pre-work physical upcoming, we will check a treadmill Myoview stress test as a baseline. 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

## 2017-06-17 DIAGNOSIS — E119 Type 2 diabetes mellitus without complications: Secondary | ICD-10-CM | POA: Diagnosis not present

## 2017-06-17 DIAGNOSIS — M2042 Other hammer toe(s) (acquired), left foot: Secondary | ICD-10-CM | POA: Diagnosis not present

## 2017-07-16 ENCOUNTER — Telehealth (HOSPITAL_COMMUNITY): Payer: Self-pay

## 2017-07-16 NOTE — Telephone Encounter (Signed)
Encounter complete. 

## 2017-07-17 ENCOUNTER — Telehealth (HOSPITAL_COMMUNITY): Payer: Self-pay

## 2017-07-17 DIAGNOSIS — M2042 Other hammer toe(s) (acquired), left foot: Secondary | ICD-10-CM | POA: Diagnosis not present

## 2017-07-17 DIAGNOSIS — E119 Type 2 diabetes mellitus without complications: Secondary | ICD-10-CM | POA: Diagnosis not present

## 2017-07-17 NOTE — Telephone Encounter (Signed)
Encounter complete. 

## 2017-07-18 ENCOUNTER — Ambulatory Visit (HOSPITAL_COMMUNITY)
Admission: RE | Admit: 2017-07-18 | Discharge: 2017-07-18 | Disposition: A | Discharge status: Home / Self Care | Payer: Medicare Other | Source: Ambulatory Visit | Attending: Cardiovascular Disease | Admitting: Cardiovascular Disease

## 2017-07-18 ENCOUNTER — Other Ambulatory Visit: Payer: Self-pay | Admitting: Pharmacist Clinician (PhC)/ Clinical Pharmacy Specialist

## 2017-07-18 DIAGNOSIS — Z951 Presence of aortocoronary bypass graft: Secondary | ICD-10-CM | POA: Diagnosis not present

## 2017-07-18 DIAGNOSIS — I25710 Atherosclerosis of autologous vein coronary artery bypass graft(s) with unstable angina pectoris: Secondary | ICD-10-CM | POA: Diagnosis not present

## 2017-07-18 DIAGNOSIS — I214 Non-ST elevation (NSTEMI) myocardial infarction: Secondary | ICD-10-CM | POA: Diagnosis not present

## 2017-07-18 DIAGNOSIS — Z021 Encounter for pre-employment examination: Secondary | ICD-10-CM | POA: Insufficient documentation

## 2017-07-18 DIAGNOSIS — R9439 Abnormal result of other cardiovascular function study: Secondary | ICD-10-CM | POA: Diagnosis not present

## 2017-07-18 MED ORDER — TICAGRELOR 90 MG PO TABS: 90.0000 mg | ORAL_TABLET | Freq: Two times a day (BID) | ORAL | 3 refills | Status: DC

## 2017-07-18 MED ORDER — REGADENOSON 0.4 MG/5ML IV SOLN
0.4000 mg | Freq: Once | INTRAVENOUS | Status: AC
  Administered 2017-07-18: 0.4 mg via INTRAVENOUS

## 2017-07-18 MED ORDER — AMLODIPINE BESYLATE 5 MG PO TABS: 5.0000 mg | ORAL_TABLET | Freq: Every day | ORAL | 3 refills | Status: DC

## 2017-07-18 MED ORDER — TECHNETIUM TC 99M TETROFOSMIN IV KIT
29.8000 | PACK | Freq: Once | INTRAVENOUS | Status: AC | PRN
  Administered 2017-07-18: 29.8 via INTRAVENOUS
  Filled 2017-07-18: qty 30

## 2017-07-18 MED ORDER — TECHNETIUM TC 99M TETROFOSMIN IV KIT
9.9000 | PACK | Freq: Once | INTRAVENOUS | Status: AC | PRN
  Administered 2017-07-18: 9.9 via INTRAVENOUS
  Filled 2017-07-18: qty 10

## 2017-07-18 NOTE — Telephone Encounter (Signed)
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  Will increase his amlodipine to 5 mg daily as he notes his BP has been elevated recently, and see him in the CVRR in 2 weeks

## 2017-07-19 LAB — MYOCARDIAL PERFUSION IMAGING
CHL CUP NUCLEAR SDS: 5
CHL CUP NUCLEAR SRS: 4
CHL CUP NUCLEAR SSS: 9
CHL CUP RESTING HR STRESS: 58 {beats}/min
CSEPPHR: 93 {beats}/min
LV dias vol: 110 mL (ref 62–150)
LV sys vol: 62 mL
NUC STRESS TID: 1.09

## 2017-07-22 ENCOUNTER — Telehealth: Payer: Self-pay | Admitting: *Deleted

## 2017-07-22 NOTE — Telephone Encounter (Signed)
LEFT MESSAGE TO CALL BACK

## 2017-07-22 NOTE — Telephone Encounter (Signed)
Spoke to patient. Result given . 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

## 2017-07-22 NOTE — Telephone Encounter (Signed)
-----   Message from Leonie Man, MD sent at 07/21/2017  1:14 PM EST ----- Stress test result is reassuring.  Pump function is moderately reduced as we already knew.  There is evidence of old heart attack with some mild reduced function during stress versus rest.  Despite this it is still read as low risk.  In the absence of symptoms, I would simply continue to treat medically.  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  [name], MD

## 2017-07-30 ENCOUNTER — Ambulatory Visit (INDEPENDENT_AMBULATORY_CARE_PROVIDER_SITE_OTHER): Payer: Medicare Other | Admitting: Pharmacist

## 2017-07-30 VITALS — BP 154/84 | HR 68

## 2017-07-30 DIAGNOSIS — I25710 Atherosclerosis of autologous vein coronary artery bypass graft(s) with unstable angina pectoris: Secondary | ICD-10-CM

## 2017-07-30 DIAGNOSIS — I10 Essential (primary) hypertension: Secondary | ICD-10-CM

## 2017-07-30 MED ORDER — METOPROLOL SUCCINATE ER 25 MG PO TB24: 12.5000 mg | ORAL_TABLET | Freq: Every day | ORAL | 4 refills | Status: DC

## 2017-07-30 NOTE — Progress Notes (Signed)
Patient ID: Brandon Vasquez                 DOB: August 20, 1946                      MRN: [id number]     HPI: Brandon Vasquez is a 71 y.o. male referred by Dr. Ellyn Hack to HTN clinic. PMH includes CAD s/p CABG x4 , NSTEMI, hypertension, hyperlipidemia, and diabetes. Patient was previously on lisinopril for his hypertension but converted to amlodipine plus HCTZ due to ADR to lisinopril.  Patient presents to clinic to get " back right" with hjis medication. Noted last metoprolol dose was > 2 weeks ago. He is out of refills and was unable to get medication from pharmacy.  Current HTN meds:  Amlodipine 5mg  daily HCTZ 12.5mg  daily Metoprolol 12.5mg  daily - 2 weeks off medication  Previously tried:  Lisinopril - hypotension and dizziness  BP goal: 130/80  Family History: Alzheimer's disease in his father; Cerebral aneurysm in his mother; Hypertension in his mother.  Social History: former smoker (quit date 07/10/1996), denies alcohol use, marijuana user  Home BP readings: 158/87 this morning - patient history  Wt Readings from Last 3 Encounters:  07/18/17 173 lb (78.5 kg)  06/06/17 173 lb (78.5 kg)  03/28/17 171 lb (77.6 kg)   BP Readings from Last 3 Encounters:  07/30/17 (!) 154/84  06/06/17 (!) 142/80  03/28/17 126/78   Pulse Readings from Last 3 Encounters:  07/30/17 68  06/06/17 (!) 58  03/28/17 76    Past Medical History:  Diagnosis Date  . Coronary artery disease involving native heart with angina pectoris [facility]) 2005   a. 2005: CABG x5V in [place]  b. 04/2016: PCI/DES to SVG--> RCA. He also had severe ostial native circumflex with the distal circumflex not being protected by grafts due to occlusion of both OM branches.   . Essential hypertension   . HCV (hepatitis C virus)   . HLD (hyperlipidemia)   . Hx of CABG 2001   [place] - CABG 4: LIMA-LAD, SeqSVG-OM1-OM2,SVG-rPDA  . Non-ST elevation MI (NSTEMI) ([place]) 04/2016   Culprit lesion was 90% SVG-RPDA -> DES PCI. Also noted  80% ostial native circumflex with distal circumflex potentially in jeopardy -> MEDICAL Management.  . Type 2 diabetes mellitus with complication ([place])    CAD    Current Outpatient Medications on File Prior to Visit  Medication Sig Dispense Refill  . amLODipine (NORVASC) 5 MG tablet Take 1 tablet (5 mg total) daily by mouth. 30 tablet 3  . aspirin EC 81 MG tablet Take 81 mg by mouth daily.    . calcium carbonate (CALCIUM-CARB 600) 600 MG TABS tablet Take 1,200 mg by mouth daily.     Marland Kitchen ezetimibe (ZETIA) 10 MG tablet Take 10 mg by mouth daily.    . hydrochlorothiazide (HYDRODIURIL) 12.5 MG tablet Take 1 tablet (12.5 mg total) by mouth daily. 90 tablet 3  . metFORMIN (GLUCOPHAGE-XR) 500 MG 24 hr tablet Take 500 mg by mouth daily.     . pravastatin (PRAVACHOL) 80 MG tablet TAKE 1 TABLET (80 MG TOTAL) BY MOUTH DAILY. 90 tablet 3  . ticagrelor (BRILINTA) 90 MG TABS tablet Take 1 tablet (90 mg total) 2 (two) times daily by mouth. 180 tablet 3  . HYDROcodone-acetaminophen (NORCO) 5-325 MG tablet Take 1 tablet by mouth every 6 (six) hours as needed for moderate pain. (Patient not taking: Reported on 07/30/2017) 10 tablet 0  .  nitroGLYCERIN (NITROSTAT) 0.4 MG SL tablet Place 1 tablet (0.4 mg total) under the tongue every 5 (five) minutes x 3 doses as needed for chest pain. (Patient not taking: Reported on 07/30/2017) 25 tablet 12   No current facility-administered medications on file prior to visit.     No Known Allergies  Blood pressure (!) 154/84, pulse 68, SpO2 99 %.  Essential hypertension Blood pressure today well above desired goal of 130/80. Last dose of metoprolol taken > 2 weeks ago, and lisinopril was discontinued due to hypotension and dizziness.  Will resume metoprolol succinate 12.5mg  daily, continue daily BP monitoring and f/u in 3 weeks to re-assess therapy. Plan to increase amlodipine dose if additional BP control needed.  Daquavion Catala Rodriguez-Guzman PharmD, BCPS, [place] [address] 07/31/2017 7:55 PM

## 2017-07-30 NOTE — Patient Instructions (Addendum)
Return for a follow up appointment in 3 weeks (will call to re-schedule)  Your blood pressure today is 154/84 pulse 68  Check your blood pressure at home daily (if able) and keep record of the readings.  Take your BP meds as follows: *RESUME taking metoprolol 12.5mg  daily* *Continue all other medication as previously prescribed*  Bring your BP cuff and your record of home blood pressures to your next appointment.  Exercise as you're able, try to walk approximately 30 minutes per day.  Keep salt intake to a minimum, especially watch canned and prepared boxed foods.  Eat more fresh fruits and vegetables and fewer canned items.  Avoid eating in fast food restaurants.    HOW TO TAKE YOUR BLOOD PRESSURE: . Rest 5 minutes before taking your blood pressure. .  Don't smoke or drink caffeinated beverages for at least 30 minutes before. . Take your blood pressure before (not after) you eat. . Sit comfortably with your back supported and both feet on the floor (don't cross your legs). . Elevate your arm to heart level on a table or a desk. . Use the proper sized cuff. It should fit smoothly and snugly around your bare upper arm. There should be enough room to slip a fingertip under the cuff. The bottom edge of the cuff should be 1 inch above the crease of the elbow. . Ideally, take 3 measurements at one sitting and record the average.

## 2017-07-31 ENCOUNTER — Encounter: Payer: Self-pay | Admitting: Pharmacist

## 2017-07-31 NOTE — Assessment & Plan Note (Signed)
Blood pressure today well above desired goal of 130/80. Last dose of metoprolol taken > 2 weeks ago, and lisinopril was discontinued due to hypotension and dizziness.  Will resume metoprolol succinate 12.5mg  daily, continue daily BP monitoring and f/u in 3 weeks to re-assess therapy. Plan to increase amlodipine dose if additional BP control needed.

## 2017-08-20 DIAGNOSIS — Z23 Encounter for immunization: Secondary | ICD-10-CM | POA: Diagnosis not present

## 2017-08-20 DIAGNOSIS — R040 Epistaxis: Secondary | ICD-10-CM | POA: Diagnosis not present

## 2017-09-05 ENCOUNTER — Other Ambulatory Visit: Payer: Self-pay | Admitting: Pharmacist

## 2017-09-05 MED ORDER — HYDROCHLOROTHIAZIDE 12.5 MG PO TABS: 12.5000 mg | ORAL_TABLET | Freq: Every day | ORAL | 0 refills | Status: DC

## 2017-11-06 ENCOUNTER — Telehealth: Payer: Self-pay | Admitting: *Deleted

## 2017-11-06 DIAGNOSIS — Z79899 Other long term (current) drug therapy: Secondary | ICD-10-CM

## 2017-11-06 DIAGNOSIS — E785 Hyperlipidemia, unspecified: Secondary | ICD-10-CM

## 2017-11-06 NOTE — Telephone Encounter (Signed)
Mailed letter and labslip 

## 2017-11-06 NOTE — Telephone Encounter (Signed)
-----   Message from Raiford Simmonds, RN sent at 06/06/2017 10:02 AM EDT ----- LIPIDS AND CMP IN 6 MONTH DUE April 4,2019 [facility] IN MARCH 2019

## 2017-11-22 ENCOUNTER — Other Ambulatory Visit: Payer: Self-pay | Admitting: Cardiology

## 2017-11-22 DIAGNOSIS — Z79899 Other long term (current) drug therapy: Secondary | ICD-10-CM | POA: Diagnosis not present

## 2017-11-22 DIAGNOSIS — E785 Hyperlipidemia, unspecified: Secondary | ICD-10-CM | POA: Diagnosis not present

## 2017-11-22 LAB — COMPLETE METABOLIC PANEL WITH GFR
AG RATIO: 1.2 (calc) (ref 1.0–2.5)
ALBUMIN MSPROF: 4.2 g/dL (ref 3.6–5.1)
ALT: 15 U/L (ref 9–46)
AST: 23 U/L (ref 10–35)
Alkaline phosphatase (APISO): 63 U/L (ref 40–115)
BILIRUBIN TOTAL: 0.6 mg/dL (ref 0.2–1.2)
BUN: 15 mg/dL (ref 7–25)
CHLORIDE: 104 mmol/L (ref 98–110)
CO2: 27 mmol/L (ref 20–32)
Calcium: 9.6 mg/dL (ref 8.6–10.3)
Creat: 1.17 mg/dL (ref 0.70–1.18)
GFR, EST AFRICAN AMERICAN: 73 mL/min/{1.73_m2} (ref >=60)
GFR, Est Non African American: 63 mL/min/{1.73_m2} (ref >=60)
GLOBULIN: 3.6 g/dL (ref 1.9–3.7)
Glucose, Bld: 97 mg/dL (ref 65–99)
POTASSIUM: 4 mmol/L (ref 3.5–5.3)
SODIUM: 139 mmol/L (ref 135–146)
TOTAL PROTEIN: 7.8 g/dL (ref 6.1–8.1)

## 2017-11-22 LAB — LIPID PANEL
CHOLESTEROL: 193 mg/dL (ref <200)
HDL: 54 mg/dL (ref >40)
LDL Cholesterol (Calc): 115 mg/dL (calc) — ABNORMAL HIGH
NON-HDL CHOLESTEROL (CALC): 139 mg/dL — AB (ref <130)
TRIGLYCERIDES: 126 mg/dL (ref <150)
Total CHOL/HDL Ratio: 3.6 (calc) (ref <5.0)

## 2017-12-05 ENCOUNTER — Other Ambulatory Visit: Payer: Self-pay | Admitting: Cardiology

## 2017-12-24 ENCOUNTER — Encounter: Payer: Self-pay | Admitting: Cardiology

## 2017-12-24 ENCOUNTER — Ambulatory Visit (INDEPENDENT_AMBULATORY_CARE_PROVIDER_SITE_OTHER): Payer: Medicare Other | Admitting: Cardiology

## 2017-12-24 VITALS — BP 162/84 | HR 82 | Ht 73.0 in | Wt 192.8 lb

## 2017-12-24 DIAGNOSIS — I214 Non-ST elevation (NSTEMI) myocardial infarction: Secondary | ICD-10-CM | POA: Diagnosis not present

## 2017-12-24 DIAGNOSIS — I10 Essential (primary) hypertension: Secondary | ICD-10-CM | POA: Diagnosis not present

## 2017-12-24 DIAGNOSIS — I25119 Atherosclerotic heart disease of native coronary artery with unspecified angina pectoris: Secondary | ICD-10-CM | POA: Diagnosis not present

## 2017-12-24 DIAGNOSIS — E119 Type 2 diabetes mellitus without complications: Secondary | ICD-10-CM

## 2017-12-24 DIAGNOSIS — E785 Hyperlipidemia, unspecified: Secondary | ICD-10-CM | POA: Diagnosis not present

## 2017-12-24 MED ORDER — AMLODIPINE BESYLATE 10 MG PO TABS: 10.0000 mg | ORAL_TABLET | Freq: Every day | ORAL | 6 refills | Status: DC

## 2017-12-24 MED ORDER — METOPROLOL SUCCINATE ER 25 MG PO TB24: 25.0000 mg | ORAL_TABLET | Freq: Every day | ORAL | 6 refills | Status: DC

## 2017-12-24 MED ORDER — ROSUVASTATIN CALCIUM 40 MG PO TABS: 40.0000 mg | ORAL_TABLET | Freq: Every day | ORAL | 6 refills | Status: DC

## 2017-12-24 MED ORDER — CLOPIDOGREL BISULFATE 75 MG PO TABS: 75.0000 mg | ORAL_TABLET | Freq: Every day | ORAL | 3 refills | Status: DC

## 2017-12-24 NOTE — Progress Notes (Signed)
PCP: Patient, No Pcp Per  Clinic Note: Chief Complaint  Patient presents with  . Follow-up    Doing well  . Coronary Artery Disease    No angina  . Hypertension    Not controlled  . Hyperlipidemia    LDL 115    HPI: Brandon Vasquez is a 72 y.o. 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   Myoview stress test November 2018: EF 44%.  Medium size moderate severity fixed defect in the basal inferolateral mid inferolateral wall consistent with prior infarct with mild peri-infarct ischemia.  LOW RISK.  He also has hypertension, hyperlipidemia and type 2 diabetes mellitus - Was converted from lisinopril to amlodipine plus HCTZ for hypotension/dizziness  Brandon Vasquez was last seen in Oct 2018 doing well.  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  He was doing well from a cardiac standpoint.  Recent Hospitalizations: None  Studies Personally Reviewed - (if available, images/films reviewed: From Epic Chart or Care Everywhere)  Myoview November 2018: Nuclear stress EF: 44%.  The left ventricular ejection fraction is moderately decreased (30-44%).  Defect 1: There is a medium defect of moderate severity present in the basal inferolateral and mid inferolateral location.  Findings consistent with prior myocardial infarction with peri-infarct ischemia.  This is a low risk study.   Low risk stress nuclear study with moderate inferolateral scar  and mild peri-infarct ischemia.  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  He is doing fairly well from a cardiac standpoint.  He really denies any active symptoms of chest tightness or pressure with rest or exertion.  He may get a little bit of exertional dyspnea, and is noted his blood pressures going up of late.  He has not really changed his diet and is still trying to stay active with exercise, but has gained a significant amount of weight since I last saw him.  He seems to be quite deconditioned now.  To a full 25 mg tablet. No sense of rapid irregular heartbeat or palpitations. He does not have any heart failure symptoms of PND, orthopnea or edema.  No syncope/near syncope or TIAs or cyanosis fugax.  No melena, hematochezia, hematuria or epistaxis.  No myalgias or arthralgias. No claudication   ROS: A comprehensive was performed. Review of Systems  Constitutional: Negative for malaise/fatigue (Just less energy than he used to have) and weight loss (Weight gain).  HENT: Negative for nosebleeds.   Respiratory: Negative for shortness of breath.   Cardiovascular:       Negative per HPI  Gastrointestinal: Negative for heartburn.  Genitourinary: Negative for dysuria.  Musculoskeletal: Positive for joint pain (Mild arthritis pains).  Neurological: Negative for dizziness.  Psychiatric/Behavioral: Negative.  Negative for memory loss. The patient is not nervous/anxious and does not have insomnia.   All other systems reviewed and are negative.  I have reviewed and (  if needed) personally updated the patient's problem list, medications, allergies, past medical and surgical history, social and family history.   Past Medical History:  Diagnosis Date  . Coronary artery disease involving native heart with angina pectoris [facility]) 2005   a. 2005: CABG x5V in [place]  b. 04/2016: PCI/DES to  SVG--> RCA. He also had severe ostial native circumflex with the distal circumflex not being protected by grafts due to occlusion of both OM branches.   . Essential hypertension   . HCV (hepatitis C virus)   . HLD (hyperlipidemia)   . Hx of CABG 2001   [place] - CABG 4: LIMA-LAD, SeqSVG-OM1-OM2,SVG-rPDA  . Non-ST elevation MI (NSTEMI) ([place]) 04/2016   Culprit lesion was 90% SVG-RPDA -> DES PCI. Also noted 80% ostial native circumflex with distal circumflex potentially in jeopardy -> MEDICAL Management.  . Type 2 diabetes mellitus with complication (HCC)    CAD    Past Surgical History:  Procedure Laterality Date  . CARDIAC CATHETERIZATION N/A 04/23/2016   Procedure: Left Heart Cath and Cors/Grafts Angiography;  Surgeon: Jettie Booze, MD;  Location: [place] [facility];  Service: Cardiovascular: ostLAD 75%->pLAD 100%, ostOM1 & OM2 100%, pRCA 80% -> dRCA 50%; LIMA-LAD patent, SVG-OM1-OM2 ~20% prox otw patentt, ost-pSVG-rPDA 90% --> PCI  . CARDIAC CATHETERIZATION N/A 04/23/2016   Procedure: Coronary Stent Intervention;  Surgeon: Jettie Booze, MD;  Location: [facility];  Service: Cardiovascular;  Laterality: N/A;  DES PCI - 90% pSVG- PDA (Synergy DES 3.5 x 20)  . CORONARY ARTERY BYPASS GRAFT  2001   LIMA-LAD, SeqSVG-OM1-OM2,SVG-rPDA  . NM MYOVIEW LTD  07/2017   EF 44%.  Medium-sized, moderate severity fixed defect in basal inferolateral and mid inferolateral wall consistent with prior infarct and mild peri-infarct ischemia.  LOW RISK.  Marland Kitchen TRANSTHORACIC ECHOCARDIOGRAM  04/2016   EF 60-65%. GR 2 DD. Inferior hypokinesis. No significant valvular lesions   Wall Motion    Diagnostic Diagram                                          Post-Intervention Diagram           Current Meds  Medication Sig  . aspirin EC 81 MG tablet Take 81 mg by mouth daily.  . calcium carbonate (CALCIUM-CARB 600) 600 MG TABS tablet Take 1,200 mg by mouth daily.   Marland Kitchen ezetimibe (ZETIA) 10 MG tablet  Take 10 mg by mouth daily.  . hydrochlorothiazide (HYDRODIURIL) 12.5 MG tablet TAKE 1 TABLET (12.5 MG TOTAL) BY MOUTH DAILY.  Marland Kitchen HYDROcodone-acetaminophen (NORCO) 5-325 MG tablet Take 1 tablet by mouth every 6 (six) hours as needed for moderate pain.  . metFORMIN (GLUCOPHAGE-XR) 500 MG 24 hr tablet Take 500 mg by mouth daily.   . nitroGLYCERIN (NITROSTAT) 0.4 MG SL tablet Place 1 tablet (0.4 mg total) under the tongue every 5 (five) minutes x 3 doses as needed for chest pain.  . [DISCONTINUED] amLODipine (NORVASC) 5 MG tablet Take 5 mg by mouth daily.  . [DISCONTINUED] metoprolol succinate (TOPROL-XL) 25 MG 24 hr tablet TAKE 0.5 TABLETS (12.5 MG TOTAL) BY MOUTH DAILY.  . [DISCONTINUED] pravastatin (PRAVACHOL) 80 MG tablet TAKE 1 TABLET (80 MG TOTAL) BY MOUTH DAILY.  . [DISCONTINUED] ticagrelor (BRILINTA) 90 MG TABS tablet Take 1 tablet (90 mg total) 2 (two) times daily by mouth.    No Known Allergies  family history includes Alzheimer's disease in his father; Cerebral aneurysm in his mother; Hypertension in his mother.  Wt Readings from Last 3 Encounters:  12/24/17 192 lb 12.8 oz (87.5 kg)  07/18/17 173 lb (78.5 kg)  06/06/17 173 lb (78.5 kg)  -- lost the taste of meat!   PHYSICAL EXAM BP (!) 162/84   Pulse 82   Ht 6\' 1"  (1.854 m)   Wt 192 lb 12.8 oz (87.5 kg)   SpO2 97%   BMI 25.44 kg/m  Physical Exam  Constitutional: He is oriented to person, place, and time. He appears well-developed and well-nourished. No distress.  Healthy appearing. Well groomed.  HENT:  Head: Normocephalic and atraumatic.  Eyes: Pupils are equal, round, and reactive to light. EOM are normal.  Neck: Normal range of motion. Neck supple. No hepatojugular reflux and no JVD present. Carotid bruit is not present.  Cardiovascular: Normal rate, regular rhythm, normal heart sounds, intact distal pulses and normal pulses.  No extrasystoles are present. PMI is not displaced. Exam reveals no gallop and no friction  rub.  No murmur heard. Pulmonary/Chest: Effort normal and breath sounds normal. No respiratory distress. He has no wheezes. He has no rales.  Abdominal: Soft. Bowel sounds are normal. He exhibits no distension. There is no tenderness. There is no rebound and no guarding.  Musculoskeletal: Normal range of motion. He exhibits no edema.  L pointer finger tip missing (has protective sleeve on).  Neurological: He is alert and oriented to person, place, and time. No cranial nerve deficit.  Skin: Skin is warm and dry. No rash noted. No erythema. No pallor.  Psychiatric: His behavior is normal. Judgment and thought content normal.  Nursing note and vitals reviewed.   Adult ECG Report Not checked  Other studies Reviewed: Additional studies/ records that were reviewed today include:  Recent Labs:    Lab Results  Component Value Date   CHOL 193 11/22/2017   HDL 54 11/22/2017   LDLCALC 115 (H) 11/22/2017   TRIG 126 11/22/2017   CHOLHDL 3.6 11/22/2017   Lab Results  Component Value Date   TSH 0.871 04/21/2016   Lab Results  Component Value Date   CREATININE 1.17 11/22/2017   BUN 15 11/22/2017   NA 139 11/22/2017   K 4.0 11/22/2017   CL 104 11/22/2017   CO2 27 11/22/2017    ASSESSMENT / PLAN: Problem List Items Addressed This Visit    NSTEMI (non-ST elevated myocardial infarction) ([place]) (Chronic)    Fixed perfusion defect in the inferolateral wall consistent with prior infarct from the occluded vein graft to the RCA.  Less dramatic presentation then his pre-CABG MI which was sitting cardiac arrest.  He has done well since this last non-STEMI with no recurrent angina or heart failure symptoms despite having mildly reduced EF.      Relevant Medications   metoprolol succinate (TOPROL XL) 25 MG 24 hr tablet   amLODipine (NORVASC) 10 MG tablet   rosuvastatin (CRESTOR) 40 MG tablet   Essential hypertension (Chronic)    We have enlisted the assistance of [facility] (our pharmacist run  cardiovascular risk reduction clinic) to help with management. Currently he is on amlodipine and metoprolol succinate.  He has been on ACE inhibitor but that was discontinued due to hypotension and dizziness to indicate a reason for why he is not on ARB or ACE inhibitor. Currently his blood pressure is not adequately controlled and my plan is to increase amlodipine to 10 mg and  Toprol to a full 25 mg tablet.  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  Labs are not at goal. Plan will be to convert from pravastatin to Crestor 40 mg.  Reassess labs in 3 months prior to follow-up with Almyra Deforest, PA.      Relevant Medications   metoprolol succinate (TOPROL XL) 25 MG 24 hr tablet   amLODipine (NORVASC) 10 MG tablet   rosuvastatin (CRESTOR) 40 MG tablet   Other Relevant Orders   Hemoglobin A1c   Lipid panel   Comprehensive metabolic panel   Diabetes mellitus type II, non insulin dependent (HCC) (Chronic)    Still currently does not have a PCP here locally.  We will check an A1c along with his baseline labs.  He needs to establish primary care here.      Relevant Medications   rosuvastatin (CRESTOR) 40 MG tablet   Other Relevant Orders   Hemoglobin A1c   Lipid panel   Comprehensive metabolic panel   Coronary artery disease involving native heart with angina pectoris (HCC) - Primary (Chronic)    No recurrent angina since revascularization on amlodipine and metoprolol.  He has not had to use any nitroglycerin. Nonischemic Myoview. He is still on aspirin and Brilinta.  At this point I think we can  stop aspirin and when he finishes his current Brilinta dosing we can switch him over to Plavix for maintenance (in the interest of cost)      Relevant Medications   metoprolol succinate (TOPROL XL) 25 MG 24 hr tablet   amLODipine (NORVASC) 10 MG tablet   rosuvastatin (CRESTOR) 40 MG tablet   Other Relevant Orders   Hemoglobin A1c   Lipid panel   Comprehensive metabolic panel      Current medicines are reviewed at length with the patient today. (+/- concerns) n/a The following changes have been made: n/a  Patient Instructions  MEDICATION INSTRUCTIONS  -- INCREASE TOPROL XL 25 MG ONE TABLET DAILY  -- INCREASE AMLODIPINE 10 MG ONE TABLET A  DAY  ---- STOP PRAVASTATIN   --- START  CRESTOR  ( ROSUVASTATIN) 40 MG TAKE AT BEDTIME.   FINISH TAKING BRILINTA  90MG   THEN CALL OFFICE TO START CLOPIDOGREL 75 MG DAILY.    LABS IN June 2019 -- WILL MAIL YOU LABSLIP AND LETTER. LIPID CMP HGBA1C   Your physician recommends that you schedule a follow-up appointment in 3 MONTHS WITH HAO PA.- BEFORE PATIENT NEEDS MEDICATION REFILLED.      Studies Ordered:   Orders Placed This Encounter  Procedures  . Hemoglobin A1c  . Lipid panel  . Comprehensive metabolic panel      [name], M.D., M.S. Interventional Cardiologist   Pager # [phone] Phone # [phone] [address] [place], [place] [address]

## 2017-12-24 NOTE — Patient Instructions (Signed)
MEDICATION INSTRUCTIONS  -- INCREASE TOPROL XL 25 MG ONE TABLET DAILY  -- INCREASE AMLODIPINE 10 MG ONE TABLET A  DAY  ---- STOP PRAVASTATIN   --- START  CRESTOR  ( ROSUVASTATIN) 40 MG TAKE AT BEDTIME.   FINISH TAKING BRILINTA  90MG   THEN CALL OFFICE TO START CLOPIDOGREL 75 MG DAILY.    LABS IN June 2019 -- WILL MAIL YOU LABSLIP AND LETTER. LIPID CMP HGBA1C   Your physician recommends that you schedule a follow-up appointment in 3 MONTHS WITH HAO PA.- BEFORE PATIENT NEEDS MEDICATION REFILLED.

## 2017-12-26 ENCOUNTER — Encounter: Payer: Self-pay | Admitting: Cardiology

## 2017-12-26 NOTE — Assessment & Plan Note (Signed)
Continues to be on high-dose pravastatin plus Zetia.  Labs are not at goal. Plan will be to convert from pravastatin to Crestor 40 mg.  Reassess labs in 3 months prior to follow-up with Brandon Deforest, PA.

## 2017-12-26 NOTE — Assessment & Plan Note (Signed)
No recurrent angina since revascularization on amlodipine and metoprolol.  He has not had to use any nitroglycerin. Nonischemic Myoview. He is still on aspirin and Brilinta.  At this point I think we can stop aspirin and when he finishes his current Brilinta dosing we can switch him over to Plavix for maintenance (in the interest of cost)

## 2017-12-26 NOTE — Assessment & Plan Note (Signed)
We have enlisted the assistance of Brandon Vasquez (our pharmacist run cardiovascular risk reduction clinic) to help with management. Currently he is on amlodipine and metoprolol succinate.  He has been on ACE inhibitor but that was discontinued due to hypotension and dizziness to indicate a reason for why he is not on ARB or ACE inhibitor. Currently his blood pressure is not adequately controlled and my plan is to increase amlodipine to 10 mg and Toprol to a full 25 mg tablet.  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

## 2017-12-26 NOTE — Assessment & Plan Note (Signed)
Still currently does not have a PCP here locally.  We will check an A1c along with his baseline labs.  He needs to establish primary care here.

## 2017-12-26 NOTE — Assessment & Plan Note (Signed)
Fixed perfusion defect in the inferolateral wall consistent with prior infarct from the occluded vein graft to the RCA.  Less dramatic presentation then his pre-CABG MI which was sitting cardiac arrest.  He has done well since this last non-STEMI with no recurrent angina or heart failure symptoms despite having mildly reduced EF.

## 2018-02-17 ENCOUNTER — Other Ambulatory Visit: Payer: Self-pay | Admitting: Cardiology

## 2018-03-25 ENCOUNTER — Ambulatory Visit (INDEPENDENT_AMBULATORY_CARE_PROVIDER_SITE_OTHER): Payer: Medicare Other | Admitting: Physician Assistant

## 2018-03-25 ENCOUNTER — Encounter: Payer: Self-pay | Admitting: Physician Assistant

## 2018-03-25 VITALS — BP 147/87 | HR 69 | Ht 73.0 in | Wt 185.4 lb

## 2018-03-25 DIAGNOSIS — Z8619 Personal history of other infectious and parasitic diseases: Secondary | ICD-10-CM

## 2018-03-25 DIAGNOSIS — E119 Type 2 diabetes mellitus without complications: Secondary | ICD-10-CM

## 2018-03-25 DIAGNOSIS — E785 Hyperlipidemia, unspecified: Secondary | ICD-10-CM

## 2018-03-25 DIAGNOSIS — R079 Chest pain, unspecified: Secondary | ICD-10-CM | POA: Diagnosis not present

## 2018-03-25 DIAGNOSIS — I25119 Atherosclerotic heart disease of native coronary artery with unspecified angina pectoris: Secondary | ICD-10-CM

## 2018-03-25 DIAGNOSIS — I25709 Atherosclerosis of coronary artery bypass graft(s), unspecified, with unspecified angina pectoris: Secondary | ICD-10-CM | POA: Diagnosis not present

## 2018-03-25 DIAGNOSIS — R6883 Chills (without fever): Secondary | ICD-10-CM | POA: Diagnosis not present

## 2018-03-25 MED ORDER — ROSUVASTATIN CALCIUM 40 MG PO TABS: 40.0000 mg | ORAL_TABLET | Freq: Every day | ORAL | 3 refills | Status: DC

## 2018-03-25 MED ORDER — HYDROCHLOROTHIAZIDE 12.5 MG PO TABS: 12.5000 mg | ORAL_TABLET | Freq: Every day | ORAL | 3 refills | Status: DC

## 2018-03-25 MED ORDER — ISOSORBIDE MONONITRATE ER 30 MG PO TB24: 30.0000 mg | ORAL_TABLET | Freq: Every day | ORAL | 3 refills | Status: DC

## 2018-03-25 MED ORDER — CLOPIDOGREL BISULFATE 75 MG PO TABS: 75.0000 mg | ORAL_TABLET | Freq: Every day | ORAL | 3 refills | Status: DC

## 2018-03-25 NOTE — Patient Instructions (Addendum)
Medication Instructions:   STOP:   1. BIRLINTA  90 MG    2.PRAVACHOL  80 MG  START :  1. ASPIRIN 81 MG ONCE A DAY   2. MDUR 30 MG ONCE A DAY   3. CRESTOR 40 MG ONCE D AY   4.  PLAVIX 75 MG ONCE  DAY    If you need a refill on your cardiac medications before your next appointment, please call your pharmacy.    Labwork:   CBC  CMP  TROPONIN I  AND TSH  Today     FASTING LABS  2 DAYS BEFORE 6 WEEKS FOLLOW UP    Testing/Procedures:  NONE ORDERED  TODAY    Follow-Up: IN 6 WEEKS WITH HAO MENG    Any Other Special Instructions Will Be Listed Below (If Applicable).

## 2018-03-25 NOTE — Progress Notes (Signed)
Cardiology Office Note    Date:  03/27/2018   ID:  Brandon Vasquez, DOB 1946-07-15, MRN [id number]  PCP:  Patient, No Pcp Per  Cardiologist:  Dr. Ellyn Hack   Chief Complaint  Patient presents with  . Follow-up    seen for Dr. Ellyn Hack.     History of Present Illness:  Brandon Vasquez is a 72 y.o. male with PMH of CAD s/p CABG, hyperlipidemia, DM 2 and history of hepatitis C.  Patient had a CABG x4 with LIMA to LAD, SVG to RPDA, sequential SVG to OM1 and OM 2 in 2001 in [place].  He presented with NSTEMI in August 2017 and underwent PCI to SVG to RCA, medical therapy for native left circumflex disease.  Myoview in November 2018 showed EF 44%, medium size moderate severity fixed defect in the basal inferolateral, mid inferolateral wall consistent with prior MI with mild peri-infarct ischemia, overall considered low risk.  He was previously on lisinopril, this has been switched to amlodipine plus HCTZ to avoid hypotension and dizziness.  During his last office visit with Dr. Ellyn Hack on 12/24/2017, his amlodipine was increased to 10 mg and Toprol to 25mg  daily tablet.  His Pravachol was stopped and he was started on Crestor 40 mg daily.  Patient presents today for reevaluation of blood pressure and cholesterol.  He was also switched from Brilinta to Plavix.  He presents today for cardiology office visit.  His recent lipid panel continue to show uncontrolled cholesterol.  Instead of switching to Plavix, he is still on the Brilinta at this point.  He is also on Pravachol and has not started on Crestor.  I will stop his Brilinta and switch to aspirin Plavix.  I will also ask him to switch the Pravachol to Crestor.  He needs a refill on his hydrochlorothiazide.  Otherwise, his main complaint was a burning sensation that occurred yesterday.  He says the burning sensation was slightly different from the previous angina.  It is also worse with deep inspiration.  He is chest pain-free today.  However he says  the chest discomfort lasted essentially the entire day yesterday.  I called Dr. Ellyn Hack, his primary cardiologist to discuss his case.  We will run a stat troponin at this time, if it is elevated, he will need to go to the hospital.  During the meantime, I will start him on 30 mg daily of Imdur.  He does not take any Viagra, Levitra or Cialis.  He is also complaining of chills that has been going on for a while, however particularly worse yesterday.  I will obtain CBC, CMP and a TSH today.  He is aware that if chills continue to become worse he will need to see either urgent care or family physician.  He has a history of treated hepatitis C.  I will see him back in 6 weeks. 2 days before he see me, he will need a fasting lipid panel and liver function test.   Past Medical History:  Diagnosis Date  . Coronary artery disease involving native heart with angina pectoris [facility]) 2005   a. 2005: CABG x5V in [place]  b. 04/2016: PCI/DES to SVG--> RCA. He also had severe ostial native circumflex with the distal circumflex not being protected by grafts due to occlusion of both OM branches.   . Essential hypertension   . HCV (hepatitis C virus)   . HLD (hyperlipidemia)   . Hx of CABG 2001   [place] - CABG 4:  LIMA-LAD, SeqSVG-OM1-OM2,SVG-rPDA  . Non-ST elevation MI (NSTEMI) ([place]) 04/2016   Culprit lesion was 90% SVG-RPDA -> DES PCI. Also noted 80% ostial native circumflex with distal circumflex potentially in jeopardy -> MEDICAL Management.  . Type 2 diabetes mellitus with complication (HCC)    CAD    Past Surgical History:  Procedure Laterality Date  . CARDIAC CATHETERIZATION N/A 04/23/2016   Procedure: Left Heart Cath and Cors/Grafts Angiography;  Surgeon: Jettie Booze, MD;  Location: [place] [facility];  Service: Cardiovascular: ostLAD 75%->pLAD 100%, ostOM1 & OM2 100%, pRCA 80% -> dRCA 50%; LIMA-LAD patent, SVG-OM1-OM2 ~20% prox otw patentt, ost-pSVG-rPDA 90% --> PCI  . CARDIAC CATHETERIZATION  N/A 04/23/2016   Procedure: Coronary Stent Intervention;  Surgeon: Jettie Booze, MD;  Location: [place] [facility];  Service: Cardiovascular;  Laterality: N/A;  DES PCI - 90% pSVG- PDA (Synergy DES 3.5 x 20)  . CORONARY ARTERY BYPASS GRAFT  2001   LIMA-LAD, SeqSVG-OM1-OM2,SVG-rPDA  . NM MYOVIEW LTD  07/2017   EF 44%.  Medium-sized, moderate severity fixed defect in basal inferolateral and mid inferolateral wall consistent with prior infarct and mild peri-infarct ischemia.  LOW RISK.  Marland Kitchen TRANSTHORACIC ECHOCARDIOGRAM  04/2016   EF 60-65%. GR 2 DD. Inferior hypokinesis. No significant valvular lesions    Current Medications: Outpatient Medications Prior to Visit  Medication Sig Dispense Refill  . aspirin 81 MG EC tablet Take 81 mg by mouth daily. Swallow whole.    . calcium carbonate (CALCIUM-CARB 600) 600 MG TABS tablet Take 1,200 mg by mouth daily.     Marland Kitchen ezetimibe (ZETIA) 10 MG tablet Take 10 mg by mouth daily.    Marland Kitchen HYDROcodone-acetaminophen (NORCO) 5-325 MG tablet Take 1 tablet by mouth every 6 (six) hours as needed for moderate pain. 10 tablet 0  . metFORMIN (GLUCOPHAGE-XR) 500 MG 24 hr tablet Take 500 mg by mouth daily.     . metoprolol succinate (TOPROL XL) 25 MG 24 hr tablet Take 1 tablet (25 mg total) by mouth daily. 60 tablet 6  . nitroGLYCERIN (NITROSTAT) 0.4 MG SL tablet Place 1 tablet (0.4 mg total) under the tongue every 5 (five) minutes x 3 doses as needed for chest pain. 25 tablet 12  . BRILINTA 90 MG TABS tablet Take 1 tablet by mouth 2 (two) times daily.    . hydrochlorothiazide (HYDRODIURIL) 12.5 MG tablet TAKE 1 TABLET (12.5 MG TOTAL) BY MOUTH DAILY. 90 tablet 0  . pravastatin (PRAVACHOL) 80 MG tablet TAKE 1 TABLET (80 MG TOTAL) BY MOUTH DAILY. 90 tablet 3  . amLODipine (NORVASC) 10 MG tablet Take 1 tablet (10 mg total) by mouth daily. 30 tablet 6  . aspirin EC 81 MG tablet Take 81 mg by mouth daily.    . clopidogrel (PLAVIX) 75 MG tablet Take 1 tablet (75 mg total) by  mouth daily. 90 tablet 3  . rosuvastatin (CRESTOR) 40 MG tablet Take 1 tablet (40 mg total) by mouth daily. 30 tablet 6   No facility-administered medications prior to visit.      Allergies:   Patient has no known allergies.   Social History   Socioeconomic History  . Marital status: Married    Spouse name: Not on file  . Number of children: Not on file  . Years of education: Not on file  . Highest education level: Not on file  Occupational History  . Not on file  Social Needs  . Financial resource strain: Not on file  . Food  insecurity:    Worry: Not on file    Inability: Not on file  . Transportation needs:    Medical: Not on file    Non-medical: Not on file  Tobacco Use  . Smoking status: Former Smoker    Packs/day: 1.00    Years: 30.00    Pack years: 30.00    Types: Cigarettes    Last attempt to quit: 07/10/1996    Years since quitting: 21.7  . Smokeless tobacco: Never Used  Substance and Sexual Activity  . Alcohol use: No  . Drug use: Yes    Types: Marijuana  . Sexual activity: Not on file  Lifestyle  . Physical activity:    Days per week: Not on file    Minutes per session: Not on file  . Stress: Not on file  Relationships  . Social connections:    Talks on phone: Not on file    Gets together: Not on file    Attends religious service: Not on file    Active member of club or organization: Not on file    Attends meetings of clubs or organizations: Not on file    Relationship status: Not on file  Other Topics Concern  . Not on file  Social History Narrative  . Not on file     Family History:  The patient's family history includes Alzheimer's disease in his father; Cerebral aneurysm in his mother; Hypertension in his mother.   ROS:   Please see the history of present illness.    ROS All other systems reviewed and are negative.   PHYSICAL EXAM:   VS:  BP (!) 147/87   Pulse 69   Ht 6\' 1"  (1.854 m)   Wt 185 lb 6.4 oz (84.1 kg)   BMI 24.46 kg/m      GEN: Well nourished, well developed, in no acute distress  HEENT: normal  Neck: no JVD, carotid bruits, or masses Cardiac: RRR; no murmurs, rubs, or gallops,no edema  Respiratory:  clear to auscultation bilaterally, normal work of breathing GI: soft, nontender, nondistended, + BS MS: no deformity or atrophy  Skin: warm and dry, no rash Neuro:  Alert and Oriented x 3, Strength and sensation are intact Psych: euthymic mood, full affect  Wt Readings from Last 3 Encounters:  03/25/18 185 lb 6.4 oz (84.1 kg)  12/24/17 192 lb 12.8 oz (87.5 kg)  07/18/17 173 lb (78.5 kg)      Studies/Labs Reviewed:   EKG:  EKG is ordered today.  The ekg ordered today demonstrates normal sinus rhythm, no significant ST-T wave changes, single PVC  Recent Labs: 03/25/2018: ALT 14; BUN 12; Creatinine, Ser 1.05; Hemoglobin 12.8; Platelets 244; Potassium 4.2; Sodium 140; TSH 1.450   Lipid Panel    Component Value Date/Time   CHOL 193 11/22/2017 1030   TRIG 126 11/22/2017 1030   HDL 54 11/22/2017 1030   CHOLHDL 3.6 11/22/2017 1030   VLDL 16 12/12/2016 1000   LDLCALC 115 (H) 11/22/2017 1030    Additional studies/ records that were reviewed today include:   Cath 04/23/2016  Prox RCA lesion, 80 %stenosed. Dist RCA diffuse lesion, 50 %stenosed.  Left main into Ost Cx lesion, 80 %stenosed, not bypassed.  Prox LAD lesion, 100 %stenosed. Ost LAD to Prox LAD lesion, 75 %stenosed. LIMA to LAD is patent.  Ost 1st Mrg to 1st Mrg lesion, 100 %stenosed. Ost 2nd Mrg to 2nd Mrg lesion, 100 %stenosed. SVG to jump graft from OM1 to OM2 is  widely patent.  SVG to PDA Origin lesion, 90 %stenosed.A STENT SYNERGY DES 3.5X20 drug eluting stent was successfully placed and dilated to 4 mm.  Post intervention, there is a 0% residual stenosis.  The left ventricular ejection fraction is 55-65% by visual estimate.  LV end diastolic pressure is normal.  The left ventricular systolic function is normal.  There is no  aortic valve stenosis.   Continue dual antiplatelet therapy for at least a year.  Consider PCI of the left main into the circumflex.       Echo 04/22/2016 LV EF: 60% -   65% Study Conclusions  - Left ventricle: The cavity size was normal. There was moderate   concentric hypertrophy. Systolic function was normal. The   estimated ejection fraction was in the range of 60% to 65%.   Possible mild hypokinesis of the inferior myocardium. Features   are consistent with a pseudonormal left ventricular filling   pattern, with concomitant abnormal relaxation and increased   filling pressure (grade 2 diastolic dysfunction).     Myoview 07/18/2017 Study Highlights     Nuclear stress EF: 44%.  The left ventricular ejection fraction is moderately decreased (30-44%).  Defect 1: There is a medium defect of moderate severity present in the basal inferolateral and mid inferolateral location.  Findings consistent with prior myocardial infarction with peri-infarct ischemia.  This is a low risk study.   Low risk stress nuclear study with moderate inferolateral scar and mild peri-infarct ischemia. Mildly decreased left ventricular global systolic function.    ASSESSMENT:    1. Chest pain, unspecified type   2. Dyslipidemia, goal LDL below 70   3. Chills (without fever)   4. Coronary artery disease involving coronary bypass graft of native heart with angina pectoris ([place])   5. Controlled type 2 diabetes mellitus without complication, without long-term current use of insulin ([place])   6. History of hepatitis C      PLAN:  In order of problems listed above:  1. Chest pain: Patient had a prolonged episode of burning chest pain yesterday, this has resolved.  Symptom was slightly different from the previous angina.  I will obtain a stat troponin.  Add Imdur 30 mg daily  2. Chills: No fever, also occurred yesterday.  Obtain CBC to make sure white blood cell is not elevated.  Patient has a  history of treated hepatitis C. Obtain CMP and TSH.  3. CAD s/p CABG: On aspirin, switch Brilinta to Plavix 75 mg daily  4. Hyperlipidemia: Cholesterol still uncontrolled, switch Pravachol to Crestor to 40 mg daily.  He will need to repeat lab work before his office visit with me in 6 weeks.  5. DM2: On metformin, managed by primary care provider.    Medication Adjustments/Labs and Tests Ordered: Current medicines are reviewed at length with the patient today.  Concerns regarding medicines are outlined above.  Medication changes, Labs and Tests ordered today are listed in the Patient Instructions below. Patient Instructions  Medication Instructions:   STOP:   1. BIRLINTA  90 MG    2.PRAVACHOL  80 MG  START :  1. ASPIRIN 81 MG ONCE A DAY   2. MDUR 30 MG ONCE A DAY   3. CRESTOR 40 MG ONCE D AY   4.  PLAVIX 75 MG ONCE  DAY    If you need a refill on your cardiac medications before your next appointment, please call your pharmacy.    Labwork:   CBC  CMP  TROPONIN I  AND TSH  Today     FASTING LABS  2 DAYS BEFORE 6 WEEKS FOLLOW UP    Testing/Procedures:  NONE ORDERED  TODAY    Follow-Up: IN 6 WEEKS WITH Brandon Vasquez    Any Other Special Instructions Will Be Listed Below (If Applicable).                                                                                                                                                      Hilbert Corrigan, [place]  03/27/2018 10:56 PM    [facility] [place], [place], [place]  [address] Phone: [phone]; Fax: [phone]

## 2018-03-26 LAB — CBC
HEMATOCRIT: 41.2 % (ref 37.5–51.0)
Hemoglobin: 12.8 g/dL — ABNORMAL LOW (ref 13.0–17.7)
MCH: 21.7 pg — ABNORMAL LOW (ref 26.6–33.0)
MCHC: 31.1 g/dL — ABNORMAL LOW (ref 31.5–35.7)
MCV: 70 fL — ABNORMAL LOW (ref 79–97)
PLATELETS: 244 10*3/uL (ref 150–450)
RBC: 5.91 x10E6/uL — AB (ref 4.14–5.80)
RDW: 19.1 % — AB (ref 12.3–15.4)
WBC: 7.2 10*3/uL (ref 3.4–10.8)

## 2018-03-26 LAB — COMPREHENSIVE METABOLIC PANEL
ALBUMIN: 4.4 g/dL (ref 3.5–4.8)
ALK PHOS: 61 IU/L (ref 39–117)
ALT: 14 IU/L (ref 0–44)
AST: 19 IU/L (ref 0–40)
Albumin/Globulin Ratio: 1.2 (ref 1.2–2.2)
BUN / CREAT RATIO: 11 (ref 10–24)
BUN: 12 mg/dL (ref 8–27)
Bilirubin Total: 0.7 mg/dL (ref 0.0–1.2)
CO2: 24 mmol/L (ref 20–29)
CREATININE: 1.05 mg/dL (ref 0.76–1.27)
Calcium: 9.6 mg/dL (ref 8.6–10.2)
Chloride: 103 mmol/L (ref 96–106)
GFR calc Af Amer: 83 mL/min/{1.73_m2} (ref >59)
GFR calc non Af Amer: 72 mL/min/{1.73_m2} (ref >59)
GLUCOSE: 89 mg/dL (ref 65–99)
Globulin, Total: 3.7 g/dL (ref 1.5–4.5)
Potassium: 4.2 mmol/L (ref 3.5–5.2)
Sodium: 140 mmol/L (ref 134–144)
TOTAL PROTEIN: 8.1 g/dL (ref 6.0–8.5)

## 2018-03-26 LAB — TROPONIN I

## 2018-03-26 LAB — TSH: TSH: 1.45 u[IU]/mL (ref 0.450–4.500)

## 2018-03-27 ENCOUNTER — Encounter: Payer: Self-pay | Admitting: Physician Assistant

## 2018-04-29 DIAGNOSIS — E785 Hyperlipidemia, unspecified: Secondary | ICD-10-CM | POA: Diagnosis not present

## 2018-04-29 DIAGNOSIS — Z79899 Other long term (current) drug therapy: Secondary | ICD-10-CM | POA: Diagnosis not present

## 2018-04-30 LAB — COMPREHENSIVE METABOLIC PANEL WITH GFR
ALT: 24 [IU]/L (ref 0–44)
AST: 25 [IU]/L (ref 0–40)
Albumin/Globulin Ratio: 1.3 (ref 1.2–2.2)
Albumin: 4.5 g/dL (ref 3.5–4.8)
Alkaline Phosphatase: 58 [IU]/L (ref 39–117)
BUN/Creatinine Ratio: 12 (ref 10–24)
BUN: 14 mg/dL (ref 8–27)
Bilirubin Total: 0.5 mg/dL (ref 0.0–1.2)
CO2: 24 mmol/L (ref 20–29)
Calcium: 9.6 mg/dL (ref 8.6–10.2)
Chloride: 102 mmol/L (ref 96–106)
Creatinine, Ser: 1.15 mg/dL (ref 0.76–1.27)
GFR calc Af Amer: 74 mL/min/{1.73_m2}
GFR calc non Af Amer: 64 mL/min/{1.73_m2}
Globulin, Total: 3.4 g/dL (ref 1.5–4.5)
Glucose: 94 mg/dL (ref 65–99)
Potassium: 4 mmol/L (ref 3.5–5.2)
Sodium: 142 mmol/L (ref 134–144)
Total Protein: 7.9 g/dL (ref 6.0–8.5)

## 2018-04-30 LAB — LIPID PANEL
CHOLESTEROL TOTAL: 153 mg/dL (ref 100–199)
Chol/HDL Ratio: 2.5 ratio (ref 0.0–5.0)
HDL: 61 mg/dL (ref >39)
LDL CALC: 78 mg/dL (ref 0–99)
TRIGLYCERIDES: 70 mg/dL (ref 0–149)
VLDL Cholesterol Cal: 14 mg/dL (ref 5–40)

## 2018-05-01 ENCOUNTER — Encounter: Payer: Self-pay | Admitting: Physician Assistant

## 2018-05-01 ENCOUNTER — Ambulatory Visit (INDEPENDENT_AMBULATORY_CARE_PROVIDER_SITE_OTHER): Payer: Medicare Other | Admitting: Physician Assistant

## 2018-05-01 VITALS — BP 144/74 | HR 66 | Ht 72.0 in | Wt 188.0 lb

## 2018-05-01 DIAGNOSIS — R079 Chest pain, unspecified: Secondary | ICD-10-CM | POA: Diagnosis not present

## 2018-05-01 DIAGNOSIS — E119 Type 2 diabetes mellitus without complications: Secondary | ICD-10-CM

## 2018-05-01 DIAGNOSIS — E785 Hyperlipidemia, unspecified: Secondary | ICD-10-CM | POA: Diagnosis not present

## 2018-05-01 DIAGNOSIS — I25710 Atherosclerosis of autologous vein coronary artery bypass graft(s) with unstable angina pectoris: Secondary | ICD-10-CM | POA: Diagnosis not present

## 2018-05-01 MED ORDER — ISOSORBIDE MONONITRATE ER 60 MG PO TB24: 60.0000 mg | ORAL_TABLET | Freq: Every day | ORAL | 3 refills | Status: DC

## 2018-05-01 NOTE — Patient Instructions (Addendum)
Medication Instructions:  Almyra Deforest, PA has recommended making the following medication changes: 1. INCREASE Isosorbide to 60 mg daily If you need a refill on your cardiac medications before your next appointment, please call your pharmacy.  Labwork: CBC, INR HERE IN OUR OFFICE AT [facility]  Take the provided lab slips with you to the lab for your blood draw.     Testing/Procedures: Your physician has requested that you have a cardiac catheterization. Cardiac catheterization is used to diagnose and/or treat various heart conditions. Doctors may recommend this procedure for a number of different reasons. The most common reason is to evaluate chest pain. Chest pain can be a symptom of coronary artery disease (CAD), and cardiac catheterization can show whether plaque is narrowing or blocking your heart's arteries. This procedure is also used to evaluate the valves, as well as measure the blood flow and oxygen levels in different parts of your heart. For further information please visit [email or web address]. Please follow instruction sheet, as given.    Follow-Up: Your physician wants you to follow-up in: 2 weeks after Cath with Almyra Deforest PA or Dr.Harding.    Thank you for choosing [facility] at [facility]!!       Brandon Vasquez [id number] Dept: [phone] Loc: [phone]  SILAS MUFF  05/01/2018  You are scheduled for a Cardiac Catheterization on Thursday, September 5 with Dr. [name].  1. Please arrive at the [facility] (Main Entrance A) at [facility]: [address] [place], [place] [address] at 10:00 AM (This time is two hours before your procedure to ensure your preparation). Free valet parking service is available.   Special note: Every effort is made to have your procedure done on time. Please understand that emergencies sometimes delay scheduled  procedures.  2. Diet: Do not eat solid foods after midnight.  The patient may have clear liquids until 5am upon the day of the procedure.  3. Labs: You will need to have blood drawn on Tuesday, September 3 at [facility]  Open: 8am - 5pm (Lunch 12:30 - 1:30)   Phone: [phone]. You do not need to be fasting.  4. Medication instructions in preparation for your procedure:  DO NOT TAKE Metformin Thursday (5th), Friday (6th), and Saturday (7th), RESTART on Sunday (8th)  On the morning of your procedure, take your Aspirin and Plavix and any morning medicines NOT listed above.  You may use sips of water.  5. Plan for one night stay--bring personal belongings. 6. Bring a current list of your medications and current insurance cards. 7. You MUST have a responsible person to drive you home. 8. Someone MUST be with you the first 24 hours after you arrive home or your discharge will be delayed. 9. Please wear clothes that are easy to get on and off and wear slip-on shoes.  Thank you for allowing [place] to care for you!   -- [facility]

## 2018-05-01 NOTE — Progress Notes (Addendum)
Cardiology Office Note    Date:  05/03/2018   ID:  Brandon Vasquez, DOB [birth year], MRN [id number]  PCP:  Patient, No Pcp Per  Cardiologist:  Dr. Ellyn Hack   Chief Complaint  Patient presents with  . Follow-up    pt mentions burning sensation when working, denies pain, SOB, swelling    History of Present Illness:  Brandon Vasquez is a 72 y.o. male with PMH of CAD s/p CABG, hyperlipidemia, DM 2 and history of hepatitis C.  Patient had a CABG x4 with LIMA to LAD, SVG to RPDA, sequential SVG to OM1 and OM 2 in 2001 in [place].  He presented with NSTEMI in August 2017 and underwent PCI to SVG to RCA, medical therapy for native left circumflex disease.  Myoview in November 2018 showed EF 44%, medium size moderate severity fixed defect in the basal inferolateral, mid inferolateral wall consistent with prior MI with mild peri-infarct ischemia, overall considered low risk.  He was previously on lisinopril, this has been switched to amlodipine plus HCTZ to avoid hypotension and dizziness.  During his last office visit with Dr. Ellyn Hack on 12/24/2017, his amlodipine was increased to 10 mg and Toprol to 25mg  daily tablet.  His Pravachol was stopped and he was started on Crestor 40 mg daily. He was also switched from Brilinta to Plavix.  I last saw the patient on 03/25/2018.  His lipid panel showed uncontrolled cholesterol, he is also on Pravachol and has not started on the Crestor.  I switched him to Crestor again.  He also has not switched to Plavix and remain on Brilinta at the time.  I asked him to stop the Brilinta and to start on the Plavix.  He is complaining of burning sensation in the chest which is different from the previous angina.  It is worse with deep inspiration.  I started him on a 30 mg daily of Imdur.  Patient presents today for cardiology office visit.  He continues to have burning sensation in the chest every time he exerts himself.  Even though the symptom is not similar to the previous  angina, however the fact that his symptoms occur every time he exerts himself is very concerning.  He denies any other exacerbating factors such as deep inspiration, body rotation and palpation.  I discussed the case with Dr. Ellyn Hack, I am very concerned that this patient may have significant underlying disease.  I recommended cardiac catheterization, risk and benefit of the procedure has been discussed with both the patient and his wife, he is agreeable to proceed.  I increased his Imdur and given him prescription for sublingual nitroglycerin.  Otherwise he has no lower extremity edema, orthopnea or PND.    Past Medical History:  Diagnosis Date  . Coronary artery disease involving native heart with angina pectoris [facility]) 2005   a. 2005: CABG x5V in [place]  b. 04/2016: PCI/DES to SVG--> RCA. He also had severe ostial native circumflex with the distal circumflex not being protected by grafts due to occlusion of both OM branches.   . Essential hypertension   . HCV (hepatitis C virus)   . HLD (hyperlipidemia)   . Hx of CABG 2001   [place] - CABG 4: LIMA-LAD, SeqSVG-OM1-OM2,SVG-rPDA  . Non-ST elevation MI (NSTEMI) ([place]) 04/2016   Culprit lesion was 90% SVG-RPDA -> DES PCI. Also noted 80% ostial native circumflex with distal circumflex potentially in jeopardy -> MEDICAL Management.  . Type 2 diabetes mellitus with complication ([place])  CAD    Past Surgical History:  Procedure Laterality Date  . CARDIAC CATHETERIZATION N/A 04/23/2016   Procedure: Left Heart Cath and Cors/Grafts Angiography;  Surgeon: Jettie Booze, MD;  Location: [facility];  Service: Cardiovascular: ostLAD 75%->pLAD 100%, ostOM1 & OM2 100%, pRCA 80% -> dRCA 50%; LIMA-LAD patent, SVG-OM1-OM2 ~20% prox otw patentt, ost-pSVG-rPDA 90% --> PCI  . CARDIAC CATHETERIZATION N/A 04/23/2016   Procedure: Coronary Stent Intervention;  Surgeon: Jettie Booze, MD;  Location: [facility];  Service: Cardiovascular;   Laterality: N/A;  DES PCI - 90% pSVG- PDA (Synergy DES 3.5 x 20)  . CORONARY ARTERY BYPASS GRAFT  2001   LIMA-LAD, SeqSVG-OM1-OM2,SVG-rPDA  . NM MYOVIEW LTD  07/2017   EF 44%.  Medium-sized, moderate severity fixed defect in basal inferolateral and mid inferolateral wall consistent with prior infarct and mild peri-infarct ischemia.  LOW RISK.  [facility] TRANSTHORACIC ECHOCARDIOGRAM  04/2016   EF 60-65%. GR 2 DD. Inferior hypokinesis. No significant valvular lesions    Current Medications: Outpatient Medications Prior to Visit  Medication Sig Dispense Refill  . aspirin 81 MG EC tablet Take 81 mg by mouth daily. Swallow whole.    . calcium carbonate (CALCIUM-CARB 600) 600 MG TABS tablet Take 1,200 mg by mouth daily.     . clopidogrel (PLAVIX) 75 MG tablet Take 1 tablet (75 mg total) by mouth daily. 90 tablet 3  . ezetimibe (ZETIA) 10 MG tablet Take 10 mg by mouth daily.    . hydrochlorothiazide (HYDRODIURIL) 12.5 MG tablet Take 1 tablet (12.5 mg total) by mouth daily. 90 tablet 3  . HYDROcodone-acetaminophen (NORCO) 5-325 MG tablet Take 1 tablet by mouth every 6 (six) hours as needed for moderate pain. 10 tablet 0  . metFORMIN (GLUCOPHAGE-XR) 500 MG 24 hr tablet Take 500 mg by mouth daily.     . metoprolol succinate (TOPROL XL) 25 MG 24 hr tablet Take 1 tablet (25 mg total) by mouth daily. 60 tablet 6  . nitroGLYCERIN (NITROSTAT) 0.4 MG SL tablet Place 1 tablet (0.4 mg total) under the tongue every 5 (five) minutes x 3 doses as needed for chest pain. 25 tablet 12  . rosuvastatin (CRESTOR) 40 MG tablet Take 1 tablet (40 mg total) by mouth daily. 90 tablet 3  . isosorbide mononitrate (IMDUR) 30 MG 24 hr tablet Take 1 tablet (30 mg total) by mouth daily. 90 tablet 3  . amLODipine (NORVASC) 10 MG tablet Take 1 tablet (10 mg total) by mouth daily. 30 tablet 6   No facility-administered medications prior to visit.      Allergies:   Patient has no known allergies.   Social History   Socioeconomic  History  . Marital status: Married    Spouse name: Not on file  . Number of children: Not on file  . Years of education: Not on file  . Highest education level: Not on file  Occupational History  . Not on file  Social Needs  . Financial resource strain: Not on file  . Food insecurity:    Worry: Not on file    Inability: Not on file  . Transportation needs:    Medical: Not on file    Non-medical: Not on file  Tobacco Use  . Smoking status: Former Smoker    Packs/day: 1.00    Years: 30.00    Pack years: 30.00    Types: Cigarettes    Last attempt to quit: 07/10/1996    Years since quitting:  21.8  . Smokeless tobacco: Never Used  Substance and Sexual Activity  . Alcohol use: No  . Drug use: Yes    Types: Marijuana  . Sexual activity: Not on file  Lifestyle  . Physical activity:    Days per week: Not on file    Minutes per session: Not on file  . Stress: Not on file  Relationships  . Social connections:    Talks on phone: Not on file    Gets together: Not on file    Attends religious service: Not on file    Active member of club or organization: Not on file    Attends meetings of clubs or organizations: Not on file    Relationship status: Not on file  Other Topics Concern  . Not on file  Social History Narrative  . Not on file     Family History:  The patient's family history includes Alzheimer's disease in his father; Cerebral aneurysm in his mother; Hypertension in his mother.   ROS:   Please see the history of present illness.    ROS All other systems reviewed and are negative.   PHYSICAL EXAM:   VS:  BP (!) 144/74 (BP Location: Right Arm, Patient Position: Sitting)   Pulse 66   Ht 6' (1.829 m)   Wt 188 lb (85.3 kg)   SpO2 99%   BMI 25.50 kg/m    GEN: Well nourished, well developed, in no acute distress  HEENT: normal  Neck: no JVD, carotid bruits, or masses Cardiac: RRR; no murmurs, rubs, or gallops,no edema  Respiratory:  clear to auscultation  bilaterally, normal work of breathing GI: soft, nontender, nondistended, + BS MS: no deformity or atrophy  Skin: warm and dry, no rash Neuro:  Alert and Oriented x 3, Strength and sensation are intact Psych: euthymic mood, full affect  Wt Readings from Last 3 Encounters:  05/01/18 188 lb (85.3 kg)  03/25/18 185 lb 6.4 oz (84.1 kg)  12/24/17 192 lb 12.8 oz (87.5 kg)      Studies/Labs Reviewed:   EKG:  EKG is not ordered today.    Recent Labs: 03/25/2018: Hemoglobin 12.8; Platelets 244; TSH 1.450 04/29/2018: ALT 24; BUN 14; Creatinine, Ser 1.15; Potassium 4.0; Sodium 142   Lipid Panel    Component Value Date/Time   CHOL 153 04/29/2018 1513   TRIG 70 04/29/2018 1513   HDL 61 04/29/2018 1513   CHOLHDL 2.5 04/29/2018 1513   CHOLHDL 3.6 11/22/2017 1030   VLDL 16 12/12/2016 1000   LDLCALC 78 04/29/2018 1513   LDLCALC 115 (H) 11/22/2017 1030    Additional studies/ records that were reviewed today include:   Cath 04/23/2016  Prox RCA lesion, 80 %stenosed. Dist RCA diffuse lesion, 50 %stenosed.  Left main into Ost Cx lesion, 80 %stenosed, not bypassed.  Prox LAD lesion, 100 %stenosed. Ost LAD to Prox LAD lesion, 75 %stenosed. LIMA to LAD is patent.  Ost 1st Mrg to 1st Mrg lesion, 100 %stenosed. Ost 2nd Mrg to 2nd Mrg lesion, 100 %stenosed. SVG to jump graft from OM1 to OM2 is widely patent.  SVG to PDA Origin lesion, 90 %stenosed.A STENT SYNERGY DES 3.5X20 drug eluting stent was successfully placed and dilated to 4 mm.  Post intervention, there is a 0% residual stenosis.  The left ventricular ejection fraction is 55-65% by visual estimate.  LV end diastolic pressure is normal.  The left ventricular systolic function is normal.  There is no aortic valve stenosis.  Continue dual  antiplatelet therapy for at least a year. Consider PCI of the left main into the circumflex.      Echo 04/22/2016 LV EF: 60% - 65% Study Conclusions  - Left ventricle: The cavity  size was normal. There was moderate concentric hypertrophy. Systolic function was normal. The estimated ejection fraction was in the range of 60% to 65%. Possible mild hypokinesis of the inferior myocardium. Features are consistent with a pseudonormal left ventricular filling pattern, with concomitant abnormal relaxation and increased filling pressure (grade 2 diastolic dysfunction).     Myoview 07/18/2017 Study Highlights     Nuclear stress EF: 44%.  The left ventricular ejection fraction is moderately decreased (30-44%).  Defect 1: There is a medium defect of moderate severity present in the basal inferolateral and mid inferolateral location.  Findings consistent with prior myocardial infarction with peri-infarct ischemia.  This is a low risk study.  Low risk stress nuclear study with moderate inferolateral scar and mild peri-infarct ischemia. Mildly decreased left ventricular global systolic function.      ASSESSMENT:    1. Coronary artery disease involving autologous vein coronary bypass graft with unstable angina pectoris ([place])   2. Hyperlipidemia, unspecified hyperlipidemia type   3. Chest pain, unspecified type   4. Controlled type 2 diabetes mellitus without complication, without long-term current use of insulin (HCC)      PLAN:  In order of problems listed above:  1. CAD: Appears to have class III angina.  Symptom did not improve with the Imdur since the last office visit.  I discussed the case with Dr. Ellyn Hack, we recommend cardiac catheterization for definitive evaluation.  I increased his Imdur to 60 mg daily.  - Risk and benefit of procedure explained to the patient who display clear understanding and agree to proceed.  Discussed with patient possible procedural risk include bleeding, vascular injury, renal injury, arrythmia, MI, stroke and loss of limb or life.  2. Hyperlipidemia: Continue Crestor and Zetia.  Last lipid panel obtained  on 04/29/2018 showed total cholesterol 153, HDL 61, LDL 78.  LDL goal less than 70.  3. DM2: Managed by primary care provider    Medication Adjustments/Labs and Tests Ordered: Current medicines are reviewed at length with the patient today.  Concerns regarding medicines are outlined above.  Medication changes, Labs and Tests ordered today are listed in the Patient Instructions below. Patient Instructions  Medication Instructions:  Almyra Deforest, PA has recommended making the following medication changes: 1. INCREASE Isosorbide to 60 mg daily If you need a refill on your cardiac medications before your next appointment, please call your pharmacy.  Labwork: CBC, INR HERE IN OUR OFFICE AT [facility]  Take the provided lab slips with you to the lab for your blood draw.     Testing/Procedures: Your physician has requested that you have a cardiac catheterization. Cardiac catheterization is used to diagnose and/or treat various heart conditions. Doctors may recommend this procedure for a number of different reasons. The most common reason is to evaluate chest pain. Chest pain can be a symptom of coronary artery disease (CAD), and cardiac catheterization can show whether plaque is narrowing or blocking your heart's arteries. This procedure is also used to evaluate the valves, as well as measure the blood flow and oxygen levels in different parts of your heart. For further information please visit [email or web address]. Please follow instruction sheet, as given.    Follow-Up: Your physician wants you to follow-up in: 2 weeks after Cath with Almyra Deforest PA or Dr.Harding.  Thank you for choosing [facility] at [facility]!!       [address] [place] [address] Dept: [phone] Loc: [phone]  BENTLEIGH STANKUS  05/01/2018  You are scheduled for a Cardiac Catheterization on Thursday,  September 5 with Dr. [name].  1. Please arrive at the [facility] (Main Entrance A) at [facility]: [address] [place], [place] [address] at 10:00 AM (This time is two hours before your procedure to ensure your preparation). Free valet parking service is available.   Special note: Every effort is made to have your procedure done on time. Please understand that emergencies sometimes delay scheduled procedures.  2. Diet: Do not eat solid foods after midnight.  The patient may have clear liquids until 5am upon the day of the procedure.  3. Labs: You will need to have blood drawn on Tuesday, September 3 at [place]  Open: 8am - 5pm (Lunch 12:30 - 1:30)   Phone: [phone]. You do not need to be fasting.  4. Medication instructions in preparation for your procedure:  DO NOT TAKE Metformin Thursday (5th), Friday (6th), and Saturday (7th), RESTART on Sunday (8th)  On the morning of your procedure, take your Aspirin and Plavix and any morning medicines NOT listed above.  You may use sips of water.  5. Plan for one night stay--bring personal belongings. 6. Bring a current list of your medications and current insurance cards. 7. You MUST have a responsible person to drive you home. 8. Someone MUST be with you the first 24 hours after you arrive home or your discharge will be delayed. 9. Please wear clothes that are easy to get on and off and wear slip-on shoes.  Thank you for allowing [place] to care for you!   -- [facility] Invasive Cardiovascular services      Signed, Almyra Deforest, [place]  05/03/2018 11:59 PM    [address], [place], [place]  [address] Phone: [phone]; Fax: [phone]    Patient well known to me - I discussed the recent change in Sx with Mr. Eulas Post & agree that the best COA is to proceed with Cardiac Cath, especially given prior "abnormal Myoview" which would only serve to cloud  judgement.  [name], M.D., M.S. Interventional Cardiologist

## 2018-05-01 NOTE — H&P (View-Only) (Signed)
Cardiology Office Note    Date:  05/03/2018   ID:  Brandon Vasquez, DOB 15-Sep-1945, MRN [id number]  PCP:  Patient, No Pcp Per  Cardiologist:  Dr. Ellyn Hack   Chief Complaint  Patient presents with  . Follow-up    pt mentions burning sensation when working, denies pain, SOB, swelling    History of Present Illness:  Brandon Vasquez is a 72 y.o. male with PMH of CAD s/p CABG, hyperlipidemia, DM 2 and history of hepatitis C.  Patient had a CABG x4 with LIMA to LAD, SVG to RPDA, sequential SVG to OM1 and OM 2 in 2001 in [place].  He presented with NSTEMI in August 2017 and underwent PCI to SVG to RCA, medical therapy for native left circumflex disease.  Myoview in November 2018 showed EF 44%, medium size moderate severity fixed defect in the basal inferolateral, mid inferolateral wall consistent with prior MI with mild peri-infarct ischemia, overall considered low risk.  He was previously on lisinopril, this has been switched to amlodipine plus HCTZ to avoid hypotension and dizziness.  During his last office visit with Dr. Ellyn Hack on 12/24/2017, his amlodipine was increased to 10 mg and Toprol to 25mg  daily tablet.  His Pravachol was stopped and he was started on Crestor 40 mg daily. He was also switched from Brilinta to Plavix.  I last saw the patient on 03/25/2018.  His lipid panel showed uncontrolled cholesterol, he is also on Pravachol and has not started on the Crestor.  I switched him to Crestor again.  He also has not switched to Plavix and remain on Brilinta at the time.  I asked him to stop the Brilinta and to start on the Plavix.  He is complaining of burning sensation in the chest which is different from the previous angina.  It is worse with deep inspiration.  I started him on a 30 mg daily of Imdur.  Patient presents today for cardiology office visit.  He continues to have burning sensation in the chest every time he exerts himself.  Even though the symptom is not similar to the previous  angina, however the fact that his symptoms occur every time he exerts himself is very concerning.  He denies any other exacerbating factors such as deep inspiration, body rotation and palpation.  I discussed the case with Dr. Ellyn Hack, I am very concerned that this patient may have significant underlying disease.  I recommended cardiac catheterization, risk and benefit of the procedure has been discussed with both the patient and his wife, he is agreeable to proceed.  I increased his Imdur and given him prescription for sublingual nitroglycerin.  Otherwise he has no lower extremity edema, orthopnea or PND.    Past Medical History:  Diagnosis Date  . Coronary artery disease involving native heart with angina pectoris [facility]) 2005   a. 2005: CABG x5V in [place]  b. 04/2016: PCI/DES to SVG--> RCA. He also had severe ostial native circumflex with the distal circumflex not being protected by grafts due to occlusion of both OM branches.   . Essential hypertension   . HCV (hepatitis C virus)   . HLD (hyperlipidemia)   . Hx of CABG 2001   [place] - CABG 4: LIMA-LAD, SeqSVG-OM1-OM2,SVG-rPDA  . Non-ST elevation MI (NSTEMI) ([place]) 04/2016   Culprit lesion was 90% SVG-RPDA -> DES PCI. Also noted 80% ostial native circumflex with distal circumflex potentially in jeopardy -> MEDICAL Management.  . Type 2 diabetes mellitus with complication ([place])  CAD    Past Surgical History:  Procedure Laterality Date  . CARDIAC CATHETERIZATION N/A 04/23/2016   Procedure: Left Heart Cath and Cors/Grafts Angiography;  Surgeon: Jettie Booze, MD;  Location: [facility];  Service: Cardiovascular: ostLAD 75%->pLAD 100%, ostOM1 & OM2 100%, pRCA 80% -> dRCA 50%; LIMA-LAD patent, SVG-OM1-OM2 ~20% prox otw patentt, ost-pSVG-rPDA 90% --> PCI  . CARDIAC CATHETERIZATION N/A 04/23/2016   Procedure: Coronary Stent Intervention;  Surgeon: Jettie Booze, MD;  Location: [facility];  Service: Cardiovascular;   Laterality: N/A;  DES PCI - 90% pSVG- PDA (Synergy DES 3.5 x 20)  . CORONARY ARTERY BYPASS GRAFT  2001   LIMA-LAD, SeqSVG-OM1-OM2,SVG-rPDA  . NM MYOVIEW LTD  07/2017   EF 44%.  Medium-sized, moderate severity fixed defect in basal inferolateral and mid inferolateral wall consistent with prior infarct and mild peri-infarct ischemia.  LOW RISK.  [facility] TRANSTHORACIC ECHOCARDIOGRAM  04/2016   EF 60-65%. GR 2 DD. Inferior hypokinesis. No significant valvular lesions    Current Medications: Outpatient Medications Prior to Visit  Medication Sig Dispense Refill  . aspirin 81 MG EC tablet Take 81 mg by mouth daily. Swallow whole.    . calcium carbonate (CALCIUM-CARB 600) 600 MG TABS tablet Take 1,200 mg by mouth daily.     . clopidogrel (PLAVIX) 75 MG tablet Take 1 tablet (75 mg total) by mouth daily. 90 tablet 3  . ezetimibe (ZETIA) 10 MG tablet Take 10 mg by mouth daily.    . hydrochlorothiazide (HYDRODIURIL) 12.5 MG tablet Take 1 tablet (12.5 mg total) by mouth daily. 90 tablet 3  . HYDROcodone-acetaminophen (NORCO) 5-325 MG tablet Take 1 tablet by mouth every 6 (six) hours as needed for moderate pain. 10 tablet 0  . metFORMIN (GLUCOPHAGE-XR) 500 MG 24 hr tablet Take 500 mg by mouth daily.     . metoprolol succinate (TOPROL XL) 25 MG 24 hr tablet Take 1 tablet (25 mg total) by mouth daily. 60 tablet 6  . nitroGLYCERIN (NITROSTAT) 0.4 MG SL tablet Place 1 tablet (0.4 mg total) under the tongue every 5 (five) minutes x 3 doses as needed for chest pain. 25 tablet 12  . rosuvastatin (CRESTOR) 40 MG tablet Take 1 tablet (40 mg total) by mouth daily. 90 tablet 3  . isosorbide mononitrate (IMDUR) 30 MG 24 hr tablet Take 1 tablet (30 mg total) by mouth daily. 90 tablet 3  . amLODipine (NORVASC) 10 MG tablet Take 1 tablet (10 mg total) by mouth daily. 30 tablet 6   No facility-administered medications prior to visit.      Allergies:   Patient has no known allergies.   Social History   Socioeconomic  History  . Marital status: Married    Spouse name: Not on file  . Number of children: Not on file  . Years of education: Not on file  . Highest education level: Not on file  Occupational History  . Not on file  Social Needs  . Financial resource strain: Not on file  . Food insecurity:    Worry: Not on file    Inability: Not on file  . Transportation needs:    Medical: Not on file    Non-medical: Not on file  Tobacco Use  . Smoking status: Former Smoker    Packs/day: 1.00    Years: 30.00    Pack years: 30.00    Types: Cigarettes    Last attempt to quit: 07/10/1996    Years since quitting:  21.8  . Smokeless tobacco: Never Used  Substance and Sexual Activity  . Alcohol use: No  . Drug use: Yes    Types: Marijuana  . Sexual activity: Not on file  Lifestyle  . Physical activity:    Days per week: Not on file    Minutes per session: Not on file  . Stress: Not on file  Relationships  . Social connections:    Talks on phone: Not on file    Gets together: Not on file    Attends religious service: Not on file    Active member of club or organization: Not on file    Attends meetings of clubs or organizations: Not on file    Relationship status: Not on file  Other Topics Concern  . Not on file  Social History Narrative  . Not on file     Family History:  The patient's family history includes Alzheimer's disease in his father; Cerebral aneurysm in his mother; Hypertension in his mother.   ROS:   Please see the history of present illness.    ROS All other systems reviewed and are negative.   PHYSICAL EXAM:   VS:  BP (!) 144/74 (BP Location: Right Arm, Patient Position: Sitting)   Pulse 66   Ht 6' (1.829 m)   Wt 188 lb (85.3 kg)   SpO2 99%   BMI 25.50 kg/m    GEN: Well nourished, well developed, in no acute distress  HEENT: normal  Neck: no JVD, carotid bruits, or masses Cardiac: RRR; no murmurs, rubs, or gallops,no edema  Respiratory:  clear to auscultation  bilaterally, normal work of breathing GI: soft, nontender, nondistended, + BS MS: no deformity or atrophy  Skin: warm and dry, no rash Neuro:  Alert and Oriented x 3, Strength and sensation are intact Psych: euthymic mood, full affect  Wt Readings from Last 3 Encounters:  05/01/18 188 lb (85.3 kg)  03/25/18 185 lb 6.4 oz (84.1 kg)  12/24/17 192 lb 12.8 oz (87.5 kg)      Studies/Labs Reviewed:   EKG:  EKG is not ordered today.    Recent Labs: 03/25/2018: Hemoglobin 12.8; Platelets 244; TSH 1.450 04/29/2018: ALT 24; BUN 14; Creatinine, Ser 1.15; Potassium 4.0; Sodium 142   Lipid Panel    Component Value Date/Time   CHOL 153 04/29/2018 1513   TRIG 70 04/29/2018 1513   HDL 61 04/29/2018 1513   CHOLHDL 2.5 04/29/2018 1513   CHOLHDL 3.6 11/22/2017 1030   VLDL 16 12/12/2016 1000   LDLCALC 78 04/29/2018 1513   LDLCALC 115 (H) 11/22/2017 1030    Additional studies/ records that were reviewed today include:   Cath 04/23/2016  Prox RCA lesion, 80 %stenosed. Dist RCA diffuse lesion, 50 %stenosed.  Left main into Ost Cx lesion, 80 %stenosed, not bypassed.  Prox LAD lesion, 100 %stenosed. Ost LAD to Prox LAD lesion, 75 %stenosed. LIMA to LAD is patent.  Ost 1st Mrg to 1st Mrg lesion, 100 %stenosed. Ost 2nd Mrg to 2nd Mrg lesion, 100 %stenosed. SVG to jump graft from OM1 to OM2 is widely patent.  SVG to PDA Origin lesion, 90 %stenosed.A STENT SYNERGY DES 3.5X20 drug eluting stent was successfully placed and dilated to 4 mm.  Vasquez intervention, there is a 0% residual stenosis.  The left ventricular ejection fraction is 55-65% by visual estimate.  LV end diastolic pressure is normal.  The left ventricular systolic function is normal.  There is no aortic valve stenosis.  Continue dual  antiplatelet therapy for at least a year. Consider PCI of the left main into the circumflex.      Echo 04/22/2016 LV EF: 60% - 65% Study Conclusions  - Left ventricle: The cavity  size was normal. There was moderate concentric hypertrophy. Systolic function was normal. The estimated ejection fraction was in the range of 60% to 65%. Possible mild hypokinesis of the inferior myocardium. Features are consistent with a pseudonormal left ventricular filling pattern, with concomitant abnormal relaxation and increased filling pressure (grade 2 diastolic dysfunction).     Myoview 07/18/2017 Study Highlights     Nuclear stress EF: 44%.  The left ventricular ejection fraction is moderately decreased (30-44%).  Defect 1: There is a medium defect of moderate severity present in the basal inferolateral and mid inferolateral location.  Findings consistent with prior myocardial infarction with peri-infarct ischemia.  This is a low risk study.  Low risk stress nuclear study with moderate inferolateral scar and mild peri-infarct ischemia. Mildly decreased left ventricular global systolic function.      ASSESSMENT:    1. Coronary artery disease involving autologous vein coronary bypass graft with unstable angina pectoris ([place])   2. Hyperlipidemia, unspecified hyperlipidemia type   3. Chest pain, unspecified type   4. Controlled type 2 diabetes mellitus without complication, without long-term current use of insulin (HCC)      PLAN:  In order of problems listed above:  1. CAD: Appears to have class III angina.  Symptom did not improve with the Imdur since the last office visit.  I discussed the case with Dr. Ellyn Hack, we recommend cardiac catheterization for definitive evaluation.  I increased his Imdur to 60 mg daily.  - Risk and benefit of procedure explained to the patient who display clear understanding and agree to proceed.  Discussed with patient possible procedural risk include bleeding, vascular injury, renal injury, arrythmia, MI, stroke and loss of limb or life.  2. Hyperlipidemia: Continue Crestor and Zetia.  Last lipid panel obtained  on 04/29/2018 showed total cholesterol 153, HDL 61, LDL 78.  LDL goal less than 70.  3. DM2: Managed by primary care provider    Medication Adjustments/Labs and Tests Ordered: Current medicines are reviewed at length with the patient today.  Concerns regarding medicines are outlined above.  Medication changes, Labs and Tests ordered today are listed in the Patient Instructions below. Patient Instructions  Medication Instructions:  Almyra Deforest, PA has recommended making the following medication changes: 1. INCREASE Isosorbide to 60 mg daily If you need a refill on your cardiac medications before your next appointment, please call your pharmacy.  Labwork: CBC, INR HERE IN OUR OFFICE AT [facility]  Take the provided lab slips with you to the lab for your blood draw.     Testing/Procedures: Your physician has requested that you have a cardiac catheterization. Cardiac catheterization is used to diagnose and/or treat various heart conditions. Doctors may recommend this procedure for a number of different reasons. The most common reason is to evaluate chest pain. Chest pain can be a symptom of coronary artery disease (CAD), and cardiac catheterization can show whether plaque is narrowing or blocking your heart's arteries. This procedure is also used to evaluate the valves, as well as measure the blood flow and oxygen levels in different parts of your heart. For further information please visit [email or web address]. Please follow instruction sheet, as given.    Follow-Up: Your physician wants you to follow-up in: 2 weeks after Cath with Almyra Deforest PA or Dr.Harding.  Thank you for choosing [facility] at [facility]!!       [address] [place] [address] Dept: [phone] Loc: [phone]  Brandon Vasquez  05/01/2018  You are scheduled for a Cardiac Catheterization on Thursday,  September 5 with Dr. [name].  1. Please arrive at the [facility] (Main Entrance A) at [facility]: [address] [place], [place] [address] at 10:00 AM (This time is two hours before your procedure to ensure your preparation). Free valet parking service is available.   Special note: Every effort is made to have your procedure done on time. Please understand that emergencies sometimes delay scheduled procedures.  2. Diet: Do not eat solid foods after midnight.  The patient may have clear liquids until 5am upon the day of the procedure.  3. Labs: You will need to have blood drawn on Tuesday, September 3 at [place]  Open: 8am - 5pm (Lunch 12:30 - 1:30)   Phone: [phone]. You do not need to be fasting.  4. Medication instructions in preparation for your procedure:  DO NOT TAKE Metformin Thursday (5th), Friday (6th), and Saturday (7th), RESTART on Sunday (8th)  On the morning of your procedure, take your Aspirin and Plavix and any morning medicines NOT listed above.  You may use sips of water.  5. Plan for one night stay--bring personal belongings. 6. Bring a current list of your medications and current insurance cards. 7. You MUST have a responsible person to drive you home. 8. Someone MUST be with you the first 24 hours after you arrive home or your discharge will be delayed. 9. Please wear clothes that are easy to get on and off and wear slip-on shoes.  Thank you for allowing [place] to care for you!   -- [facility] Invasive Cardiovascular services      Signed, Almyra Deforest, [place]  05/03/2018 11:59 PM    [address], [place],   [address] Phone: [phone]; Fax: [phone]    Patient well known to me - I discussed the recent change in Sx with Brandon Vasquez & agree that the best COA is to proceed with Cardiac Cath, especially given prior "abnormal Myoview" which would only serve to cloud  judgement.  [name], M.D., M.S. Interventional Cardiologist

## 2018-05-02 ENCOUNTER — Telehealth: Payer: Self-pay | Admitting: *Deleted

## 2018-05-02 NOTE — Telephone Encounter (Signed)
CALLED, UNABLE TO LEAVE MESSAGE -- PATIENT ALSO SCHEDULE FOR CATH 05/08/18 - ? WHEN WILL HE GET LABS DONE.

## 2018-05-02 NOTE — Telephone Encounter (Signed)
-----   Message from Leonie Man, MD sent at 05/01/2018  6:00 PM EDT ----- Labs look good.  Cholesterol panel looks pretty good - LDL still not quite at goal - better than 2 yr ago.  May need to be a bit more aggressive - can discuss post-cath.  [name], MD

## 2018-05-03 ENCOUNTER — Encounter: Payer: Self-pay | Admitting: Physician Assistant

## 2018-05-04 ENCOUNTER — Other Ambulatory Visit: Payer: Self-pay | Admitting: Physician Assistant

## 2018-05-06 DIAGNOSIS — I25710 Atherosclerosis of autologous vein coronary artery bypass graft(s) with unstable angina pectoris: Secondary | ICD-10-CM | POA: Diagnosis not present

## 2018-05-07 ENCOUNTER — Telehealth: Payer: Self-pay

## 2018-05-07 LAB — BASIC METABOLIC PANEL
BUN/Creatinine Ratio: 15 (ref 10–24)
BUN: 17 mg/dL (ref 8–27)
CALCIUM: 9.5 mg/dL (ref 8.6–10.2)
CO2: 24 mmol/L (ref 20–29)
Chloride: 101 mmol/L (ref 96–106)
Creatinine, Ser: 1.12 mg/dL (ref 0.76–1.27)
GFR calc non Af Amer: 66 mL/min/{1.73_m2} (ref >59)
GFR, EST AFRICAN AMERICAN: 76 mL/min/{1.73_m2} (ref >59)
Glucose: 129 mg/dL — ABNORMAL HIGH (ref 65–99)
POTASSIUM: 4 mmol/L (ref 3.5–5.2)
Sodium: 139 mmol/L (ref 134–144)

## 2018-05-07 LAB — CBC
HEMATOCRIT: 38.8 % (ref 37.5–51.0)
HEMOGLOBIN: 12.2 g/dL — AB (ref 13.0–17.7)
MCH: 22.1 pg — AB (ref 26.6–33.0)
MCHC: 31.4 g/dL — ABNORMAL LOW (ref 31.5–35.7)
MCV: 70 fL — AB (ref 79–97)
Platelets: 216 10*3/uL (ref 150–450)
RBC: 5.53 x10E6/uL (ref 4.14–5.80)
RDW: 18.9 % — ABNORMAL HIGH (ref 12.3–15.4)
WBC: 3.7 10*3/uL (ref 3.4–10.8)

## 2018-05-07 LAB — PROTIME-INR
INR: 1.1 (ref 0.8–1.2)
Prothrombin Time: 11.9 s (ref 9.1–12.0)

## 2018-05-07 NOTE — Telephone Encounter (Signed)
Patient contacted pre-catheterization at [facility] scheduled for:  05/08/2018 at 1200 Verified arrival time and place:  Admitting at 1000 Confirmed AM meds to be taken pre-cath with sip of water: Take ASA, plavix Hold metformin, HCTZ Confirmed patient has responsible person to drive home post procedure and observe patient for 24 hours:  yes

## 2018-05-08 ENCOUNTER — Other Ambulatory Visit: Payer: Self-pay

## 2018-05-08 ENCOUNTER — Encounter (HOSPITAL_COMMUNITY)
Admission: RE | Disposition: A | Discharge status: Home / Self Care | Payer: Self-pay | Source: Ambulatory Visit | Attending: Cardiology

## 2018-05-08 ENCOUNTER — Encounter (HOSPITAL_COMMUNITY): Payer: Self-pay | Admitting: General Practice

## 2018-05-08 ENCOUNTER — Ambulatory Visit (HOSPITAL_COMMUNITY)
Admission: RE | Admit: 2018-05-08 | Discharge: 2018-05-09 | Disposition: A | Discharge status: Home / Self Care | Payer: Medicare Other | Source: Ambulatory Visit | Attending: Cardiology | Admitting: Cardiology

## 2018-05-08 DIAGNOSIS — Z955 Presence of coronary angioplasty implant and graft: Secondary | ICD-10-CM | POA: Insufficient documentation

## 2018-05-08 DIAGNOSIS — Z79899 Other long term (current) drug therapy: Secondary | ICD-10-CM | POA: Diagnosis not present

## 2018-05-08 DIAGNOSIS — Y831 Surgical operation with implant of artificial internal device as the cause of abnormal reaction of the patient, or of later complication, without mention of misadventure at the time of the procedure: Secondary | ICD-10-CM | POA: Diagnosis not present

## 2018-05-08 DIAGNOSIS — Z951 Presence of aortocoronary bypass graft: Secondary | ICD-10-CM | POA: Diagnosis not present

## 2018-05-08 DIAGNOSIS — I10 Essential (primary) hypertension: Secondary | ICD-10-CM | POA: Diagnosis present

## 2018-05-08 DIAGNOSIS — Z7902 Long term (current) use of antithrombotics/antiplatelets: Secondary | ICD-10-CM | POA: Diagnosis not present

## 2018-05-08 DIAGNOSIS — I25710 Atherosclerosis of autologous vein coronary artery bypass graft(s) with unstable angina pectoris: Secondary | ICD-10-CM | POA: Diagnosis not present

## 2018-05-08 DIAGNOSIS — E119 Type 2 diabetes mellitus without complications: Secondary | ICD-10-CM | POA: Diagnosis not present

## 2018-05-08 DIAGNOSIS — I200 Unstable angina: Secondary | ICD-10-CM | POA: Diagnosis present

## 2018-05-08 DIAGNOSIS — I252 Old myocardial infarction: Secondary | ICD-10-CM | POA: Diagnosis not present

## 2018-05-08 DIAGNOSIS — Z8249 Family history of ischemic heart disease and other diseases of the circulatory system: Secondary | ICD-10-CM | POA: Insufficient documentation

## 2018-05-08 DIAGNOSIS — B1920 Unspecified viral hepatitis C without hepatic coma: Secondary | ICD-10-CM | POA: Insufficient documentation

## 2018-05-08 DIAGNOSIS — Z7982 Long term (current) use of aspirin: Secondary | ICD-10-CM | POA: Insufficient documentation

## 2018-05-08 DIAGNOSIS — T82856A Stenosis of peripheral vascular stent, initial encounter: Secondary | ICD-10-CM | POA: Insufficient documentation

## 2018-05-08 DIAGNOSIS — Z87891 Personal history of nicotine dependence: Secondary | ICD-10-CM | POA: Diagnosis not present

## 2018-05-08 DIAGNOSIS — I2510 Atherosclerotic heart disease of native coronary artery without angina pectoris: Secondary | ICD-10-CM | POA: Diagnosis present

## 2018-05-08 DIAGNOSIS — E1169 Type 2 diabetes mellitus with other specified complication: Secondary | ICD-10-CM | POA: Diagnosis present

## 2018-05-08 DIAGNOSIS — Z9889 Other specified postprocedural states: Secondary | ICD-10-CM | POA: Diagnosis not present

## 2018-05-08 DIAGNOSIS — Z23 Encounter for immunization: Secondary | ICD-10-CM | POA: Diagnosis not present

## 2018-05-08 DIAGNOSIS — E785 Hyperlipidemia, unspecified: Secondary | ICD-10-CM | POA: Diagnosis not present

## 2018-05-08 DIAGNOSIS — I25119 Atherosclerotic heart disease of native coronary artery with unspecified angina pectoris: Secondary | ICD-10-CM | POA: Diagnosis present

## 2018-05-08 HISTORY — DX: Acute myocardial infarction, unspecified: I21.9

## 2018-05-08 HISTORY — PX: LEFT HEART CATH AND CORS/GRAFTS ANGIOGRAPHY: CATH118250

## 2018-05-08 HISTORY — PX: CORONARY STENT INTERVENTION: CATH118234

## 2018-05-08 LAB — GLUCOSE, CAPILLARY
GLUCOSE-CAPILLARY: 132 mg/dL — AB (ref 70–99)
Glucose-Capillary: 93 mg/dL (ref 70–99)
Glucose-Capillary: 84 mg/dL (ref 70–99)
Glucose-Capillary: 83 mg/dL (ref 70–99)

## 2018-05-08 LAB — MRSA PCR SCREENING: MRSA by PCR: POSITIVE — AB

## 2018-05-08 LAB — POCT ACTIVATED CLOTTING TIME: Activated Clotting Time: 422 seconds

## 2018-05-08 SURGERY — LEFT HEART CATH AND CORS/GRAFTS ANGIOGRAPHY
Anesthesia: LOCAL

## 2018-05-08 MED ORDER — HYDRALAZINE HCL 20 MG/ML IJ SOLN: 5.0000 mg | INTRAMUSCULAR | Status: AC | PRN

## 2018-05-08 MED ORDER — MUPIROCIN 2 % EX OINT
1.0000 "application " | TOPICAL_OINTMENT | Freq: Two times a day (BID) | CUTANEOUS | Status: DC
  Administered 2018-05-08 – 2018-05-09 (×3): 1 via NASAL
  Filled 2018-05-08 (×2): qty 22

## 2018-05-08 MED ORDER — ASPIRIN 81 MG PO CHEW: 81.0000 mg | CHEWABLE_TABLET | ORAL | Status: DC

## 2018-05-08 MED ORDER — ROSUVASTATIN CALCIUM 40 MG PO TABS
40.0000 mg | ORAL_TABLET | Freq: Every day | ORAL | Status: DC
  Administered 2018-05-09: 10:00:00 40 mg via ORAL
  Filled 2018-05-08: qty 1
  Filled 2018-05-08: qty 2

## 2018-05-08 MED ORDER — SODIUM CHLORIDE 0.9% FLUSH: 3.0000 mL | Freq: Two times a day (BID) | INTRAVENOUS | Status: DC

## 2018-05-08 MED ORDER — SODIUM CHLORIDE 0.9 % WEIGHT BASED INFUSION
3.0000 mL/kg/h | INTRAVENOUS | Status: DC
  Administered 2018-05-08: 3 mL/kg/h via INTRAVENOUS

## 2018-05-08 MED ORDER — LIDOCAINE HCL (PF) 1 % IJ SOLN
INTRAMUSCULAR | Status: AC
  Filled 2018-05-08: qty 30

## 2018-05-08 MED ORDER — ONDANSETRON HCL 4 MG/2ML IJ SOLN: 4.0000 mg | Freq: Four times a day (QID) | INTRAMUSCULAR | Status: DC | PRN

## 2018-05-08 MED ORDER — ANGIOPLASTY BOOK
Freq: Once | Status: AC
  Administered 2018-05-08: 21:00:00
  Filled 2018-05-08: qty 1

## 2018-05-08 MED ORDER — HEPARIN (PORCINE) IN NACL 1000-0.9 UT/500ML-% IV SOLN
INTRAVENOUS | Status: AC
  Filled 2018-05-08: qty 1000

## 2018-05-08 MED ORDER — SODIUM CHLORIDE 0.9% FLUSH
3.0000 mL | Freq: Two times a day (BID) | INTRAVENOUS | Status: DC
  Administered 2018-05-08: 3 mL via INTRAVENOUS

## 2018-05-08 MED ORDER — BIVALIRUDIN TRIFLUOROACETATE 250 MG IV SOLR
INTRAVENOUS | Status: AC
  Filled 2018-05-08: qty 250

## 2018-05-08 MED ORDER — MIDAZOLAM HCL 2 MG/2ML IJ SOLN
INTRAMUSCULAR | Status: AC
  Filled 2018-05-08: qty 2

## 2018-05-08 MED ORDER — POLYVINYL ALCOHOL 1.4 % OP SOLN
2.0000 [drp] | Freq: Every day | OPHTHALMIC | Status: DC | PRN
  Filled 2018-05-08: qty 15

## 2018-05-08 MED ORDER — PNEUMOCOCCAL VAC POLYVALENT 25 MCG/0.5ML IJ INJ
0.5000 mL | INJECTION | INTRAMUSCULAR | Status: AC
  Administered 2018-05-09: 0.5 mL via INTRAMUSCULAR
  Filled 2018-05-08: qty 0.5

## 2018-05-08 MED ORDER — ACETAMINOPHEN 325 MG PO TABS: 650.0000 mg | ORAL_TABLET | ORAL | Status: DC | PRN

## 2018-05-08 MED ORDER — MIDAZOLAM HCL 2 MG/2ML IJ SOLN
INTRAMUSCULAR | Status: DC | PRN
  Administered 2018-05-08 (×2): 1 mg via INTRAVENOUS

## 2018-05-08 MED ORDER — METOPROLOL SUCCINATE ER 25 MG PO TB24
25.0000 mg | ORAL_TABLET | Freq: Every day | ORAL | Status: DC
  Administered 2018-05-09: 25 mg via ORAL
  Filled 2018-05-08: qty 1

## 2018-05-08 MED ORDER — SODIUM CHLORIDE 0.9 % WEIGHT BASED INFUSION: 1.0000 mL/kg/h | INTRAVENOUS | Status: DC

## 2018-05-08 MED ORDER — SODIUM CHLORIDE 0.9 % IV SOLN
INTRAVENOUS | Status: DC | PRN
  Administered 2018-05-08: 1.75 mg/kg/h via INTRAVENOUS

## 2018-05-08 MED ORDER — SODIUM CHLORIDE 0.9 % IV SOLN: 250.0000 mL | INTRAVENOUS | Status: DC | PRN

## 2018-05-08 MED ORDER — SODIUM CHLORIDE 0.9% FLUSH: 3.0000 mL | INTRAVENOUS | Status: DC | PRN

## 2018-05-08 MED ORDER — MORPHINE SULFATE (PF) 2 MG/ML IV SOLN: 1.0000 mg | INTRAVENOUS | Status: DC | PRN

## 2018-05-08 MED ORDER — TICAGRELOR 90 MG PO TABS
ORAL_TABLET | ORAL | Status: DC | PRN
  Administered 2018-05-08: 180 mg via ORAL

## 2018-05-08 MED ORDER — FENTANYL CITRATE (PF) 100 MCG/2ML IJ SOLN
INTRAMUSCULAR | Status: DC | PRN
  Administered 2018-05-08 (×2): 25 ug via INTRAVENOUS

## 2018-05-08 MED ORDER — LABETALOL HCL 5 MG/ML IV SOLN: 10.0000 mg | INTRAVENOUS | Status: AC | PRN

## 2018-05-08 MED ORDER — LIDOCAINE HCL (PF) 1 % IJ SOLN
INTRAMUSCULAR | Status: DC | PRN
  Administered 2018-05-08: 16 mL

## 2018-05-08 MED ORDER — ASPIRIN EC 81 MG PO TBEC
81.0000 mg | DELAYED_RELEASE_TABLET | Freq: Every day | ORAL | Status: DC
  Administered 2018-05-09: 81 mg via ORAL
  Filled 2018-05-08: qty 1

## 2018-05-08 MED ORDER — ISOSORBIDE MONONITRATE ER 60 MG PO TB24
60.0000 mg | ORAL_TABLET | Freq: Every day | ORAL | Status: DC
  Administered 2018-05-09: 60 mg via ORAL
  Filled 2018-05-08: qty 1

## 2018-05-08 MED ORDER — TICAGRELOR 90 MG PO TABS
90.0000 mg | ORAL_TABLET | Freq: Two times a day (BID) | ORAL | Status: DC
  Administered 2018-05-09 (×2): 90 mg via ORAL
  Filled 2018-05-08 (×2): qty 1

## 2018-05-08 MED ORDER — INSULIN ASPART 100 UNIT/ML ~~LOC~~ SOLN: 0.0000 [IU] | Freq: Three times a day (TID) | SUBCUTANEOUS | Status: DC

## 2018-05-08 MED ORDER — INFLUENZA VAC SPLIT HIGH-DOSE 0.5 ML IM SUSY
0.5000 mL | PREFILLED_SYRINGE | INTRAMUSCULAR | Status: AC
  Administered 2018-05-09: 11:00:00 0.5 mL via INTRAMUSCULAR
  Filled 2018-05-08: qty 0.5

## 2018-05-08 MED ORDER — NITROGLYCERIN 0.4 MG SL SUBL: 0.4000 mg | SUBLINGUAL_TABLET | SUBLINGUAL | Status: DC | PRN

## 2018-05-08 MED ORDER — HEPARIN (PORCINE) IN NACL 1000-0.9 UT/500ML-% IV SOLN
INTRAVENOUS | Status: DC | PRN
  Administered 2018-05-08 (×2): 500 mL

## 2018-05-08 MED ORDER — HYDROCODONE-ACETAMINOPHEN 5-325 MG PO TABS: 1.0000 | ORAL_TABLET | Freq: Four times a day (QID) | ORAL | Status: DC | PRN

## 2018-05-08 MED ORDER — BIVALIRUDIN BOLUS VIA INFUSION - CUPID
INTRAVENOUS | Status: DC | PRN
  Administered 2018-05-08: 63.975 mg via INTRAVENOUS

## 2018-05-08 MED ORDER — HYDROCHLOROTHIAZIDE 25 MG PO TABS
12.5000 mg | ORAL_TABLET | Freq: Every day | ORAL | Status: DC
  Administered 2018-05-09: 12.5 mg via ORAL
  Filled 2018-05-08: qty 1

## 2018-05-08 MED ORDER — SODIUM CHLORIDE 0.9 % IV SOLN
INTRAVENOUS | Status: AC
  Administered 2018-05-08 (×2): via INTRAVENOUS

## 2018-05-08 MED ORDER — IOHEXOL 350 MG/ML SOLN
INTRAVENOUS | Status: DC | PRN
  Administered 2018-05-08: 165 mL

## 2018-05-08 MED ORDER — TICAGRELOR 90 MG PO TABS
ORAL_TABLET | ORAL | Status: AC
  Filled 2018-05-08: qty 2

## 2018-05-08 MED ORDER — CHLORHEXIDINE GLUCONATE CLOTH 2 % EX PADS
6.0000 | MEDICATED_PAD | Freq: Every day | CUTANEOUS | Status: DC
  Administered 2018-05-08: 21:00:00 6 via TOPICAL

## 2018-05-08 MED ORDER — AMLODIPINE BESYLATE 10 MG PO TABS
10.0000 mg | ORAL_TABLET | Freq: Every day | ORAL | Status: DC
  Administered 2018-05-09: 10:00:00 10 mg via ORAL
  Filled 2018-05-08: qty 1

## 2018-05-08 MED ORDER — FENTANYL CITRATE (PF) 100 MCG/2ML IJ SOLN
INTRAMUSCULAR | Status: AC
  Filled 2018-05-08: qty 2

## 2018-05-08 MED ORDER — EZETIMIBE 10 MG PO TABS
10.0000 mg | ORAL_TABLET | Freq: Every day | ORAL | Status: DC
  Administered 2018-05-09: 10 mg via ORAL
  Filled 2018-05-08: qty 1

## 2018-05-08 SURGICAL SUPPLY — 15 items
BALLN SAPPHIRE ~~LOC~~ 3.5X8 (BALLOONS) ×2 IMPLANT
BALLN SAPPHIRE ~~LOC~~ 4.0X8 (BALLOONS) ×2 IMPLANT
CATH INFINITI 5FR MULTPACK ANG (CATHETERS) ×2 IMPLANT
CATHETER LAUNCHER 6FR MP1 (CATHETERS) ×2 IMPLANT
KIT ENCORE 26 ADVANTAGE (KITS) ×2 IMPLANT
KIT HEART LEFT (KITS) ×2 IMPLANT
PACK CARDIAC CATHETERIZATION (CUSTOM PROCEDURE TRAY) ×2 IMPLANT
SHEATH PINNACLE 5F 10CM (SHEATH) ×2 IMPLANT
SHEATH PINNACLE 6F 10CM (SHEATH) ×2 IMPLANT
SHEATH PROBE COVER 6X72 (BAG) ×2 IMPLANT
STENT SYNERGY DES 3.5X12 (Permanent Stent) ×2 IMPLANT
TRANSDUCER W/STOPCOCK (MISCELLANEOUS) ×2 IMPLANT
TUBING CIL FLEX 10 FLL-RA (TUBING) ×2 IMPLANT
WIRE ASAHI PROWATER 180CM (WIRE) ×2 IMPLANT
WIRE EMERALD 3MM-J .035X150CM (WIRE) ×2 IMPLANT

## 2018-05-08 NOTE — Discharge Instructions (Signed)
°  HOLD METFORMIN FOR A FULL 48 HOURS AFTER DISCHARGE. ° °DRINK PLENTY OF FLUIDS FOR THE NEXT 2-3 DAYS TO KEEP HYDRATED. ° ° °

## 2018-05-08 NOTE — Interval H&P Note (Signed)
History and Physical Interval Note:  05/08/2018 12:58 PM  Brandon Vasquez  has presented today for surgery, with the diagnosis of progressive angina with known native and graft coronary artery disease status post PCI.  The various methods of treatment have been discussed with the patient and family. After consideration of risks, benefits and other options for treatment, the patient has consented to  Procedure(s): LEFT HEART CATH AND CORS/GRAFTS ANGIOGRAPHY (N/A) with possible PERCUTANEOUS CORONARY INTERVENTION as a surgical intervention .  The patient's history has been reviewed, patient examined, no change in status, stable for surgery.  I have reviewed the patient's chart and labs.  Questions were answered to the patient's satisfaction.   Cath Lab Visit (complete for each Cath Lab visit)  Clinical Evaluation Leading to the Procedure:   ACS: No.  Non-ACS:    Anginal Classification: CCS III  Anti-ischemic medical therapy: Maximal Therapy (2 or more classes of medications)  Non-Invasive Test Results: No non-invasive testing performed  Prior CABG: Previous CABG   [name]

## 2018-05-08 NOTE — Care Management Note (Signed)
Case Management Note  Patient Details  Name: Brandon Vasquez MRN: [id number] Date of Birth: 03-Jun-1946  Subjective/Objective:    From home with wife, s/p stent intervention, will be on brilinta, which he was on pta,  His co pay was around 30 to 40 dollars per wife, and his insurance is still the same.  He has already used the 30 day free savings coupon so can not use again.                 Action/Plan: DC home when ready.   Expected Discharge Date:                  Expected Discharge Plan:  Home/Self Care  In-House Referral:     Discharge planning Services     Post Acute Care Choice:    Choice offered to:     DME Arranged:    DME Agency:     HH Arranged:    [place] Agency:     Status of Service:  Completed, signed off  If discussed at [employer] of Stay Meetings, dates discussed:    Additional Comments:  Zenon Mayo, RN 05/08/2018, 3:40 PM

## 2018-05-08 NOTE — Progress Notes (Signed)
Site area: right groin  Site Prior to Removal:  Level 0  Pressure Applied For 20 MINUTES    Minutes Beginning at 1703  Manual:   Yes.    Patient Status During Pull:  WNL  Post Pull Groin Site:  Level 0  Post Pull Instructions Given:  Yes.    Post Pull Pulses Present:  Yes.    Dressing Applied:  Yes.    Comments:  Tolerated procedure well

## 2018-05-09 ENCOUNTER — Encounter (HOSPITAL_COMMUNITY): Payer: Self-pay | Admitting: Cardiology

## 2018-05-09 ENCOUNTER — Telehealth: Payer: Self-pay | Admitting: Physician Assistant

## 2018-05-09 ENCOUNTER — Other Ambulatory Visit: Payer: Self-pay

## 2018-05-09 DIAGNOSIS — I10 Essential (primary) hypertension: Secondary | ICD-10-CM

## 2018-05-09 DIAGNOSIS — Z951 Presence of aortocoronary bypass graft: Secondary | ICD-10-CM | POA: Diagnosis not present

## 2018-05-09 DIAGNOSIS — Z955 Presence of coronary angioplasty implant and graft: Secondary | ICD-10-CM | POA: Diagnosis not present

## 2018-05-09 DIAGNOSIS — T82856A Stenosis of peripheral vascular stent, initial encounter: Secondary | ICD-10-CM | POA: Diagnosis not present

## 2018-05-09 DIAGNOSIS — E119 Type 2 diabetes mellitus without complications: Secondary | ICD-10-CM | POA: Diagnosis not present

## 2018-05-09 DIAGNOSIS — I25710 Atherosclerosis of autologous vein coronary artery bypass graft(s) with unstable angina pectoris: Secondary | ICD-10-CM

## 2018-05-09 DIAGNOSIS — Z87891 Personal history of nicotine dependence: Secondary | ICD-10-CM | POA: Diagnosis not present

## 2018-05-09 DIAGNOSIS — Z23 Encounter for immunization: Secondary | ICD-10-CM | POA: Diagnosis not present

## 2018-05-09 DIAGNOSIS — B1920 Unspecified viral hepatitis C without hepatic coma: Secondary | ICD-10-CM | POA: Diagnosis not present

## 2018-05-09 DIAGNOSIS — E785 Hyperlipidemia, unspecified: Secondary | ICD-10-CM

## 2018-05-09 DIAGNOSIS — I200 Unstable angina: Secondary | ICD-10-CM | POA: Diagnosis not present

## 2018-05-09 DIAGNOSIS — I252 Old myocardial infarction: Secondary | ICD-10-CM | POA: Diagnosis not present

## 2018-05-09 DIAGNOSIS — Z8249 Family history of ischemic heart disease and other diseases of the circulatory system: Secondary | ICD-10-CM | POA: Diagnosis not present

## 2018-05-09 LAB — BASIC METABOLIC PANEL
Anion gap: 9 (ref 5–15)
BUN: 18 mg/dL (ref 8–23)
CALCIUM: 9.1 mg/dL (ref 8.9–10.3)
CO2: 25 mmol/L (ref 22–32)
CREATININE: 1.23 mg/dL (ref 0.61–1.24)
Chloride: 107 mmol/L (ref 98–111)
GFR calc Af Amer: >60 mL/min (ref >60)
GFR, EST NON AFRICAN AMERICAN: 57 mL/min — AB (ref >60)
Glucose, Bld: 117 mg/dL — ABNORMAL HIGH (ref 70–99)
Potassium: 3.9 mmol/L (ref 3.5–5.1)
SODIUM: 141 mmol/L (ref 135–145)

## 2018-05-09 LAB — CBC
HCT: 35.4 % — ABNORMAL LOW (ref 39.0–52.0)
Hemoglobin: 11.2 g/dL — ABNORMAL LOW (ref 13.0–17.0)
MCH: 22.4 pg — ABNORMAL LOW (ref 26.0–34.0)
MCHC: 31.6 g/dL (ref 30.0–36.0)
MCV: 70.7 fL — ABNORMAL LOW (ref 78.0–100.0)
PLATELETS: 178 10*3/uL (ref 150–400)
RBC: 5.01 MIL/uL (ref 4.22–5.81)
RDW: 17 % — AB (ref 11.5–15.5)
WBC: 5.3 10*3/uL (ref 4.0–10.5)

## 2018-05-09 LAB — GLUCOSE, CAPILLARY
GLUCOSE-CAPILLARY: 98 mg/dL (ref 70–99)
Glucose-Capillary: 86 mg/dL (ref 70–99)

## 2018-05-09 MED ORDER — TICAGRELOR 90 MG PO TABS: 90.0000 mg | ORAL_TABLET | Freq: Two times a day (BID) | ORAL | 5 refills | Status: DC

## 2018-05-09 NOTE — Progress Notes (Signed)
CARDIAC REHAB PHASE I   PRE:  Rate/Rhythm: 50 SB  BP:  Sitting: 158/85      SaO2: 98 RA  MODE:  Ambulation: 500 ft   74 peak HR  POST:  Rate/Rhythm: 64 SR with PVCs  BP:  Sitting: 143/78    SaO2: 97 RA   Pt ambulated 521ft in hallway independently with steady gait. Pt denies CP or SOB, states he feels better than prior to cath. Pt educated on importance of Brilinta, ASA, statin and NTG. Stent card at bedside. Pt given heart healthy and diabetic diets. Reviewed restrictions and exercise guidelines. Pt seems motivated to make necessary lifestyle changes, states he has not been watching what he has been eating. Will refer to CRP II [place] Rufina Falco, RN BSN 05/09/2018 8:58 AM

## 2018-05-09 NOTE — Telephone Encounter (Signed)
New Message   Patient has a TOC appt 09/24 with Almyra Deforest

## 2018-05-09 NOTE — Discharge Summary (Addendum)
Discharge Summary    Patient ID: Brandon Vasquez,  MRN: [id number], DOB/AGE: November 19, 1945 72 y.o.  Admit date: 05/08/2018 Discharge date: 05/09/2018  Primary Care Provider: Patient, No Pcp Per Primary Cardiologist: Dr. Ellyn Hack   Discharge Diagnoses    Principal Problem:   Progressive angina [facility]) Active Problems:   Essential hypertension   Diabetes mellitus type II, non insulin dependent ([place])   Dyslipidemia, goal LDL below 70   Coronary artery disease involving native heart with angina pectoris [facility])   Coronary artery disease involving coronary bypass graft of native heart with angina pectoris ([place])   Coronary artery disease involving autologous vein coronary bypass graft with unstable angina pectoris ([facility])   Allergies No Known Allergies  Diagnostic Studies/Procedures    Cath: 05/08/18   Prox RCA lesion is 90% stenosed. Dist RCA lesion is 50% stenosed.  Seq SVG- RPDA-RPL graft was visualized by angiography and is large. -- CULPRIT LESIONS involve Ostial-Proximal Stent ISR & post stent lesion.  SVG-RPDA-PL: Prox Graft lesion is 75% stenosed just after stent  A drug-eluting stent was successfully placed using a STENT SYNERGY DES 3.5X12.  Post intervention, there is a 0% residual stenosis.  SVG-RPDA-PL: Origin to Prox Graft Stent is 70% stenosed.  Post intervention, there is a 0% residual stenosis.  Balloon angioplasty was performed using a BALLOON SAPPHIRE [place] 4.0X8.  =-=-=-=-=-=-=-=-=-  Mid LM to Ost LAD lesion is 95% stenosed.  Ost LAD to Prox LAD lesion is 75% stenosed. Prox LAD lesion is 100% stenosed.  Ost Cx lesion is 90% stenosed. Ost 1st Mrg lesion is 100% stenosed. Ost 2nd Mrg to 2nd Mrg lesion is 100% stenosed.  SVG-OM1-OM2 graft was visualized by angiography and is large. Origin to Prox Graft lesion before 1st Mrg is 20% stenosed.  LIMA graft was visualized by angiography and is normal in caliber.  =-=-=-=-=-=-=-=-=-  The left ventricular systolic  function is normal. The left ventricular ejection fraction is 55-65% by visual estimate.  LV end diastolic pressure is normal.    In-stent restenosis followed by post-stent severe stenosis in the SVG-RPDA-RPL -> successful PTCA of in-stent restenosis with a second Synergy DES (3.5 mm x 12 mm) placed to cover the distal edge lesion.  Progression of proximal RCA, distal Left Main and ostial Circumflex disease.  Otherwise patent LIMA-LAD and SVG-OM1-OM 2.  Plan: The patient will be monitored overnight and discharged tomorrow morning having restarted Brilinta at 90 mg twice daily. Hold metformin, use sliding scale insulin Anticipate discharge tomorrow and follow-up with either myself or Almyra Deforest, PA  Recommend uninterrupted dual antiplatelet therapy with Aspirin 81mg  daily and Ticagrelor 90mg  twice daily for a minimum of 12 months (ACS - Class I recommendation). -Given the fact that he denies stent overlapping in the vein graft with difficult to expand stent, we will continue on Brilinta at 60 mg after 6 months.  Can stop aspirin after 3-6 months. Would be okay to hold Brilinta 6 months post cath.  [name], MD _____________   History of Present Illness     72 y.o. male with PMH of CAD s/p CABG,hyperlipidemia, DM 2 and history of hepatitis C. Patient had a CABG x4 with LIMA to LAD, SVG to RPDA, sequential SVG to OM1 and OM 2 in 2001 in [place]. He presented with NSTEMIin August 2017 and underwent PCI to SVG to RCA, medical therapy for native left circumflex disease. Myoview in November 2018 showed EF 44%, medium size moderate severity fixed defect in the  basal inferolateral, mid inferolateral wall consistent with prior MI with mild peri-infarct ischemia, overall considered low risk. He was previously on lisinopril, this had been switched to amlodipine plus HCTZ to avoid hypotensionand dizziness. During his last office visit with Dr. Laurence Slate 12/24/2017, his amlodipine was  increased to 10 mg and Toprol to 25mg dailytablet. His Pravachol was stopped and he was started on Crestor 40 mg daily.He was also switched from Brilinta to Plavix.  Brandon Vasquez last saw the patient on 03/25/2018.  His lipid panel showed uncontrolled cholesterol, he was also on Pravachol and had not started on the Crestor.  He was switched to Crestor again.  He also had not switched to Plavix and remain on Brilinta at the time.  He was asked to stop the Brilinta and to start on the Plavix.  He was complaining of burning sensation in the chest which is different from the previous angina.  It is worse with deep inspiration.  He was started on a 30 mg daily of Imdur.  Patient presented back to the office on 98/29/19 for cardiology office visit.  He continued to have burning sensation in the chest every time he exerted himself.  Even though the symptom was not similar to the previous angina, however the fact that his symptomed occur every time he exerted himself was concerning. He denied any other exacerbating factors such as deep inspiration, body rotation and palpation. This was discussed with Dr. Ellyn Hack and decision made to set up for outpatient cardiac cath.  Hospital Course     Underwent cath noted above with ISR with post stent stenosis in the SVG-RPDA-RPL with successful PTCA and DES x1. Also noted to have progression of the pRCA, dLM, and ostial LCx with patent LIMA-LAD, and SVG-OM1-OM2. Plan for DAPT with ASA/Brilinta for at least 6 months. No recurrent chest pain noted, Worked well with cardiac rehab. He was on a good medical therapy regimen prior to admission and will be continued on the same. Of note was on Brilinta at 60mg  BID prior to admission, instructed him to stop this medication and start Brilinta 90mg  BID at the time of discharge.   General: Well developed, well nourished, male appearing in no acute distress. Head: Normocephalic, atraumatic.  Neck: Supple without bruits, JVD. Lungs:  Resp  regular and unlabored, CTA. Heart: RRR, S1, S2, no S3, S4, or murmur; no rub. Abdomen: Soft, non-tender, non-distended with normoactive bowel sounds. No hepatomegaly. No rebound/guarding. No obvious abdominal masses. Extremities: No clubbing, cyanosis, edema. Distal pedal pulses are 2+ bilaterally. Right femoral cath site stable without bruising or hematoma Neuro: Alert and oriented X 3. Moves all extremities spontaneously. Psych: Normal affect.  Brandon Vasquez was seen by Dr. Ellyn Hack and determined stable for discharge home. Follow up in the office has been arranged. Medications are listed below.   _____________  Discharge Vitals Blood pressure (!) 158/85, pulse (!) 57, temperature 97.6 F (36.4 C), temperature source Oral, resp. rate 16, height 6\' 1"  (1.854 m), weight 85.8 kg, SpO2 97 %.  Filed Weights   05/08/18 1024 05/09/18 0337  Weight: 85.3 kg 85.8 kg    Labs & Radiologic Studies    CBC Recent Labs    05/06/18 1443 05/09/18 0255  WBC 3.7 5.3  HGB 12.2* 11.2*  HCT 38.8 35.4*  MCV 70* 70.7*  PLT 216 557   Basic Metabolic Panel Recent Labs    05/06/18 1443 05/09/18 0255  NA 139 141  K 4.0 3.9  CL 101 107  CO2  24 25  GLUCOSE 129* 117*  BUN 17 18  CREATININE 1.12 1.23  CALCIUM 9.5 9.1   Liver Function Tests No results for input(s): AST, ALT, ALKPHOS, BILITOT, PROT, ALBUMIN in the last 72 hours. No results for input(s): LIPASE, AMYLASE in the last 72 hours. Cardiac Enzymes No results for input(s): CKTOTAL, CKMB, CKMBINDEX, TROPONINI in the last 72 hours. BNP Invalid input(s): POCBNP D-Dimer No results for input(s): DDIMER in the last 72 hours. Hemoglobin A1C No results for input(s): HGBA1C in the last 72 hours. Fasting Lipid Panel No results for input(s): CHOL, HDL, LDLCALC, TRIG, CHOLHDL, LDLDIRECT in the last 72 hours. Thyroid Function Tests No results for input(s): TSH, T4TOTAL, T3FREE, THYROIDAB in the last 72 hours.  Invalid input(s):  FREET3 _____________  No results found. Disposition   Pt is being discharged home today in good condition.  Follow-up Plans & Appointments    Follow-up Information    Almyra Deforest, [place] Follow up on 05/27/2018.   Specialties:  Cardiology, Radiology Why:  at 10am for your follow up appt.  Contact information: [address] [place] [address] [phone]          Discharge Instructions    AMB Referral to Cardiac Rehabilitation - Phase II   Complete by:  As directed    Diagnosis:   Stable Angina Coronary Stents     Amb Referral to Cardiac Rehabilitation   Complete by:  As directed    Diagnosis:  Coronary Stents   Diet - low sodium heart healthy   Complete by:  As directed    Discharge instructions   Complete by:  As directed    Groin Site Care Refer to this sheet in the next few weeks. These instructions provide you with information on caring for yourself after your procedure. Your caregiver may also give you more specific instructions. Your treatment has been planned according to current medical practices, but problems sometimes occur. Call your caregiver if you have any problems or questions after your procedure. HOME CARE INSTRUCTIONS You may shower 24 hours after the procedure. Remove the bandage (dressing) and gently wash the site with plain soap and water. Gently pat the site dry.  Do not apply powder or lotion to the site.  Do not sit in a bathtub, swimming pool, or whirlpool for 5 to 7 days.  No bending, squatting, or lifting anything over 10 pounds (4.5 kg) as directed by your caregiver.  Inspect the site at least twice daily.  Do not drive home if you are discharged the same day of the procedure. Have someone else drive you.  You may drive 48 hours after the procedure unless otherwise instructed by your caregiver.  What to expect: Any bruising will usually fade within 1 to 2 weeks.  Blood that collects in the tissue (hematoma) may be painful to the  touch. It should usually decrease in size and tenderness within 1 to 2 weeks.  SEEK IMMEDIATE MEDICAL CARE IF: You have unusual pain at the groin site or down the affected leg.  You have redness, warmth, swelling, or pain at the groin site.  You have drainage (other than a small amount of blood on the dressing).  You have chills.  You have a fever or persistent symptoms for more than 72 hours.  You have a fever and your symptoms suddenly get worse.  Your leg becomes pale, cool, tingly, or numb.  You have heavy bleeding from the site. Hold pressure on the site. Marland Kitchen  PLEASE DO NOT MISS ANY DOSES OF YOUR BRILINTA!!!!! Also keep a log of you blood pressures and bring back to your follow up appt. Please call the office with any questions.   Patients taking blood thinners should generally stay away from medicines like ibuprofen, Advil, Motrin, naproxen, and Aleve due to risk of stomach bleeding. You may take Tylenol as directed or talk to your primary doctor about alternatives.   Increase activity slowly   Complete by:  As directed        Discharge Medications     Medication List    TAKE these medications   amLODipine 10 MG tablet Commonly known as:  NORVASC Take 1 tablet (10 mg total) by mouth daily.   ARTIFICIAL TEARS OP Apply 1-2 drops to eye daily as needed (dry eyes).   aspirin 81 MG EC tablet Take 81 mg by mouth daily. Swallow whole.   ezetimibe 10 MG tablet Commonly known as:  ZETIA Take 10 mg by mouth daily.   hydrochlorothiazide 12.5 MG tablet Commonly known as:  HYDRODIURIL Take 1 tablet (12.5 mg total) by mouth daily.   HYDROcodone-acetaminophen 5-325 MG tablet Commonly known as:  NORCO/VICODIN Take 1 tablet by mouth every 6 (six) hours as needed for moderate pain.   isosorbide mononitrate 60 MG 24 hr tablet Commonly known as:  IMDUR Take 1 tablet (60 mg total) by mouth daily.   metFORMIN 500 MG 24 hr tablet Commonly known as:  GLUCOPHAGE-XR Take 500 mg by  mouth daily.   metoprolol succinate 25 MG 24 hr tablet Commonly known as:  TOPROL-XL Take 1 tablet (25 mg total) by mouth daily.   nitroGLYCERIN 0.4 MG SL tablet Commonly known as:  NITROSTAT Place 1 tablet (0.4 mg total) under the tongue every 5 (five) minutes x 3 doses as needed for chest pain.   rosuvastatin 40 MG tablet Commonly known as:  CRESTOR Take 1 tablet (40 mg total) by mouth daily.   ticagrelor 90 MG Tabs tablet Commonly known as:  BRILINTA Take 1 tablet (90 mg total) by mouth 2 (two) times daily. What changed:    medication strength  how much to take       Acute coronary syndrome (MI, NSTEMI, STEMI, etc) this admission?: No.     Outstanding Labs/Studies   N/a   Duration of Discharge Encounter   Greater than 30 minutes including physician time.  Signed, Reino Bellis NP-C 05/09/2018, 10:08 AM  Agree with note by Reino Bellis NP-C  Postop day 1 RCA SVG PCI and drug-eluting stenting for "in-stent restenosis in the setting of accelerated angina.  This was done femoral he.  His right femoral arterial puncture is well-healed.  He has no chest pain.  His exam is benign.  His labs are stable.  He can be discharged home on dual antiplatelet therapy including aspirin and Brilinta 90 mg p.o. twice daily and will follow up with hHao Meng  PA-C initially and Dr. Ellyn Hack after that.  Lorretta Harp, M.D., [place], [facility], [place] [address] [place], [place]  [address]  [phone] 05/09/2018 10:38 AM '

## 2018-05-12 ENCOUNTER — Telehealth (HOSPITAL_COMMUNITY): Payer: Self-pay

## 2018-05-12 NOTE — Telephone Encounter (Signed)
TOC outreach attempt #1 - VM box full

## 2018-05-12 NOTE — Telephone Encounter (Signed)
Pt insurance is active and benefits verified through Medicare A/B. Co-pay $0.00, DED $185.00/$185.00 met, out of pocket $0.00/$0.00 met, co-insurance 20%. No pre-authorization. Passport, 05/12/18 @ 1:48pm, ENI#[id number]  2ndary insurance is active and benefits verified through [employer]. Co-pay $15.00, DED $0.00/$0.00 met, out of pocket $1,000.00/$30.00 met, co-insurance 0%. No pre-authorization. Passport, 05/12/18 @ 1:50pm, VQM#[id number]

## 2018-05-13 ENCOUNTER — Telehealth (HOSPITAL_COMMUNITY): Payer: Self-pay

## 2018-05-13 NOTE — Telephone Encounter (Signed)
TOC outreach attempted #2. LMTCB. Encounter closed

## 2018-05-13 NOTE — Telephone Encounter (Signed)
Attempted to call patient in regards to Cardiac Rehab - LM on VM 

## 2018-05-16 ENCOUNTER — Telehealth: Payer: Self-pay | Admitting: Cardiology

## 2018-05-16 MED ORDER — NITROGLYCERIN 0.4 MG SL SUBL: 0.4000 mg | SUBLINGUAL_TABLET | SUBLINGUAL | 6 refills | Status: DC | PRN

## 2018-05-16 NOTE — Telephone Encounter (Signed)
°  Patients spouse calling to inform staff patient is out of medication. He is not having any issues, but will be traveling this weekend.

## 2018-05-16 NOTE — Telephone Encounter (Signed)
New Message:   . *STAT* If patient is at the pharmacy, call can be transferred to refill team.   1. Which medications need to be refilled? (please list name of each medication and dose if known)  nitroGLYCERIN (NITROSTAT) 0.4 MG SL tablet  2. Which pharmacy/location (including street and city if local pharmacy) is medication to be sent to? [place], [place] [employer] [facility]  3. Do they need a 30 day or 90 day supply? 30 Days

## 2018-05-16 NOTE — Telephone Encounter (Signed)
WIFE AWARE

## 2018-05-20 NOTE — Telephone Encounter (Signed)
Called patient to see if he was interested in participating in the Cardiac Rehab Program. Patient stated yes. Patient will come in for orientation on 07/15/18 @ 1:30PM and will attend the 2:45PM exercise class.  Mailed homework package.  Went over insurance, patient verbalized understanding.

## 2018-05-27 ENCOUNTER — Ambulatory Visit (INDEPENDENT_AMBULATORY_CARE_PROVIDER_SITE_OTHER): Payer: Medicare Other | Admitting: Physician Assistant

## 2018-05-27 ENCOUNTER — Encounter: Payer: Self-pay | Admitting: Physician Assistant

## 2018-05-27 VITALS — BP 112/70 | HR 70 | Ht 73.0 in | Wt 188.0 lb

## 2018-05-27 DIAGNOSIS — E785 Hyperlipidemia, unspecified: Secondary | ICD-10-CM | POA: Diagnosis not present

## 2018-05-27 DIAGNOSIS — E119 Type 2 diabetes mellitus without complications: Secondary | ICD-10-CM | POA: Diagnosis not present

## 2018-05-27 DIAGNOSIS — I25810 Atherosclerosis of coronary artery bypass graft(s) without angina pectoris: Secondary | ICD-10-CM

## 2018-05-27 DIAGNOSIS — I10 Essential (primary) hypertension: Secondary | ICD-10-CM

## 2018-05-27 DIAGNOSIS — I25710 Atherosclerosis of autologous vein coronary artery bypass graft(s) with unstable angina pectoris: Secondary | ICD-10-CM

## 2018-05-27 MED ORDER — AMLODIPINE BESYLATE 10 MG PO TABS: 10.0000 mg | ORAL_TABLET | Freq: Every day | ORAL | 4 refills | Status: DC

## 2018-05-27 NOTE — Patient Instructions (Signed)
Medication Instructions:  Continue current medications  If you need a refill on your cardiac medications before your next appointment, please call your pharmacy.  Labwork: Fasting Lipids and Liver in 2 Months HERE IN OUR OFFICE AT [facility]  Take the provided lab slips with you to the lab for your blood draw.   You will need to fast. DO NOT EAT OR DRINK PAST MIDNIGHT.   Testing/Procedures: None Ordered   Follow-Up: Your physician wants you to follow-up in: 3 Months with Dr Ellyn Hack.      Thank you for choosing [facility] at [facility]!!

## 2018-05-27 NOTE — Progress Notes (Signed)
Cardiology Office Note    Date:  05/27/2018   ID:  OATHER MUILENBURG, DOB 04/23/1946, MRN [id number]  PCP:  Brandon Vasquez, No Pcp Per  Cardiologist:  Dr. Ellyn Hack  Chief Complaint  Brandon Vasquez presents with  . Follow-up    seen for Dr. Ellyn Hack, post cath.    History of Present Illness:  Brandon Vasquez is a 72 y.o. male with PMH of CAD s/p CABG,hyperlipidemia, DM 2 and history of hepatitis C. Brandon Vasquez had a CABG x4 with LIMA to LAD, SVG to RPDA, sequential SVG to OM1 and OM 2 in 2001 in [place]. He presented with NSTEMIin August 2017 and underwent PCI to SVG to RCA, medical therapy for native left circumflex disease. Myoview in November 2018 showed EF 44%, medium size moderate severity fixed defect in the basal inferolateral, mid inferolateral wall consistent with prior MI with mild peri-infarct ischemia, overall considered low risk. He was previously on lisinopril, this has been switched to amlodipine plus HCTZ to avoid hypotensionand dizziness. During his last office visit with Dr. Laurence Slate 12/24/2017, his amlodipine was increased to 10 mg and Toprol to 25mg dailytablet. His Pravachol was stopped and he was started on Crestor 40 mg daily.He was also switched from Brilinta to Plavix.  I initially saw the Brandon Vasquez in July 2019, he was complaining of burning chest pain worse with deep inspiration, I started him on 30 mg daily of Imdur.  I saw the Brandon Vasquez back on 05/01/2018, he was complaining of burning sensation in the chest every time he exerted himself.  I discussed the case with Dr. Ellyn Hack due to concerning feature of progressive angina.  We eventually agreed to proceed with cardiac catheterization.  Brandon Vasquez underwent scheduled procedure on 05/08/2018 which showed 75% proximal SVG-RPDA-PL lesion, this was treated with a 3.5 x 12 mm DES.  He also had progression of proximal RCA, distal left main and ostial left circumflex disease.  Otherwise patent LIMA to LAD, patent sequential SVG to OM1 and OM  2.  Postprocedure, he was restarted on Brilinta 90 mg twice daily.  Brandon Vasquez presents today for cardiology office visit.  Since discharge, he has not had any further recurrence of chest discomfort.  Given the progression of her underlying native coronary artery disease, we will need to aggressively control his cholesterol.  His most recent cholesterol lab work showed borderline elevated LDL of 78, LDL goal is less than 70.  I recommend increase activity, recheck fasting lipid panel and LFT in 2 months.  If still elevated, will consider PCSK 9 inhibitor.  Otherwise he has no lower extremity edema, orthopnea or PND.    Past Medical History:  Diagnosis Date  . Coronary artery disease involving native heart with angina pectoris [facility]) 2005   a. 2005: CABG x5V in [place]  b. 04/2016: PCI/DES to SVG--> RCA. He also had severe ostial native circumflex with the distal circumflex not being protected by grafts due to occlusion of both OM branches.   . Essential hypertension   . HCV (hepatitis C virus)    "treated" (05/08/2018)  . HLD (hyperlipidemia)   . Hx of CABG 2001   [place] - CABG 4: LIMA-LAD, SeqSVG-OM1-OM2,SVG-rPDA  . MI (myocardial infarction) ([place]) 2001  . Non-ST elevation MI (NSTEMI) ([place]) 04/2016   Culprit lesion was 90% SVG-RPDA -> DES PCI. Also noted 80% ostial native circumflex with distal circumflex potentially in jeopardy -> MEDICAL Management.  . Type 2 diabetes mellitus with complication [facility])    CAD    Past Surgical  History:  Procedure Laterality Date  . CARDIAC CATHETERIZATION N/A 04/23/2016   Procedure: Left Heart Cath and Cors/Grafts Angiography;  Surgeon: Jettie Booze, MD;  Location: [place] [facility];  Service: Cardiovascular: ostLAD 75%->pLAD 100%, ostOM1 & OM2 100%, pRCA 80% -> dRCA 50%; LIMA-LAD patent, SVG-OM1-OM2 ~20% prox otw patentt, ost-pSVG-rPDA 90% --> PCI  . CARDIAC CATHETERIZATION N/A 04/23/2016   Procedure: Coronary Stent Intervention;  Surgeon: Jettie Booze, MD;  Location: [place] [facility];  Service: Cardiovascular;  Laterality: N/A;  DES PCI - 90% pSVG- PDA (Synergy DES 3.5 x 20)  . CORONARY ANGIOPLASTY WITH STENT PLACEMENT  05/08/2018  . CORONARY ARTERY BYPASS GRAFT  2001   LIMA-LAD, SeqSVG-OM1-OM2,SVG-rPDA  . CORONARY STENT INTERVENTION N/A 05/08/2018   Procedure: CORONARY STENT INTERVENTION;  Surgeon: Leonie Man, MD;  Location: [place] [facility];  Service: Cardiovascular;  Laterality: N/A;  . LAPAROSCOPIC CHOLECYSTECTOMY    . LEFT HEART CATH AND CORS/GRAFTS ANGIOGRAPHY N/A 05/08/2018   Procedure: LEFT HEART CATH AND CORS/GRAFTS ANGIOGRAPHY;  Surgeon: Leonie Man, MD;  Location: [place] [facility];  Service: Cardiovascular;  Laterality: N/A;  . NM MYOVIEW LTD  07/2017   EF 44%.  Medium-sized, moderate severity fixed defect in basal inferolateral and mid inferolateral wall consistent with prior infarct and mild peri-infarct ischemia.  LOW RISK.  [facility] TRANSTHORACIC ECHOCARDIOGRAM  04/2016   EF 60-65%. GR 2 DD. Inferior hypokinesis. No significant valvular lesions    Current Medications: Outpatient Medications Prior to Visit  Medication Sig Dispense Refill  . aspirin 81 MG EC tablet Take 81 mg by mouth daily. Swallow whole.    . ezetimibe (ZETIA) 10 MG tablet Take 10 mg by mouth daily.    . hydrochlorothiazide (HYDRODIURIL) 12.5 MG tablet Take 1 tablet (12.5 mg total) by mouth daily. 90 tablet 3  . HYDROcodone-acetaminophen (NORCO) 5-325 MG tablet Take 1 tablet by mouth every 6 (six) hours as needed for moderate pain. 10 tablet 0  . Hypromellose (ARTIFICIAL TEARS OP) Apply 1-2 drops to eye daily as needed (dry eyes).    . isosorbide mononitrate (IMDUR) 60 MG 24 hr tablet Take 1 tablet (60 mg total) by mouth daily. 90 tablet 3  . metFORMIN (GLUCOPHAGE-XR) 500 MG 24 hr tablet Take 500 mg by mouth daily.     . metoprolol succinate (TOPROL XL) 25 MG 24 hr tablet Take 1 tablet (25 mg total) by mouth daily. 60 tablet 6  .  rosuvastatin (CRESTOR) 40 MG tablet Take 1 tablet (40 mg total) by mouth daily. 90 tablet 3  . ticagrelor (BRILINTA) 90 MG TABS tablet Take 1 tablet (90 mg total) by mouth 2 (two) times daily. 60 tablet 5  . amLODipine (NORVASC) 10 MG tablet Take 1 tablet (10 mg total) by mouth daily. 30 tablet 6  . nitroGLYCERIN (NITROSTAT) 0.4 MG SL tablet Place 1 tablet (0.4 mg total) under the tongue every 5 (five) minutes x 3 doses as needed for chest pain. (Brandon Vasquez not taking: Reported on 05/27/2018) 25 tablet 6   No facility-administered medications prior to visit.      Allergies:   Brandon Vasquez has no known allergies.   Social History   Socioeconomic History  . Marital status: Married    Spouse name: Not on file  . Number of children: Not on file  . Years of education: Not on file  . Highest education level: Not on file  Occupational History  . Not on file  Social Needs  .  Financial resource strain: Not on file  . Food insecurity:    Worry: Not on file    Inability: Not on file  . Transportation needs:    Medical: Not on file    Non-medical: Not on file  Tobacco Use  . Smoking status: Former Smoker    Packs/day: 1.00    Years: 30.00    Pack years: 30.00    Types: Cigarettes    Last attempt to quit: 07/10/1996    Years since quitting: 21.8  . Smokeless tobacco: Never Used  Substance and Sexual Activity  . Alcohol use: Yes    Comment: 05/08/2018 "couple drinks/yyear"  . Drug use: Not Currently    Types: Marijuana    Comment: 05/08/2018 "nothing in 2019"  . Sexual activity: Not Currently  Lifestyle  . Physical activity:    Days per week: Not on file    Minutes per session: Not on file  . Stress: Not on file  Relationships  . Social connections:    Talks on phone: Not on file    Gets together: Not on file    Attends religious service: Not on file    Active member of club or organization: Not on file    Attends meetings of clubs or organizations: Not on file    Relationship status: Not  on file  Other Topics Concern  . Not on file  Social History Narrative  . Not on file     Family History:  The Brandon Vasquez's family history includes Alzheimer's disease in his father; Cerebral aneurysm in his mother; Hypertension in his mother.   ROS:   Please see the history of present illness.    ROS All other systems reviewed and are negative.   PHYSICAL EXAM:   VS:  BP 112/70   Pulse 70   Ht 6\' 1"  (1.854 m)   Wt 188 lb (85.3 kg)   BMI 24.80 kg/m    GEN: Well nourished, well developed, in no acute distress  HEENT: normal  Neck: no JVD, carotid bruits, or masses Cardiac: RRR; no murmurs, rubs, or gallops,no edema  Respiratory:  clear to auscultation bilaterally, normal work of breathing GI: soft, nontender, nondistended, + BS MS: no deformity or atrophy  Skin: warm and dry, no rash Neuro:  Alert and Oriented x 3, Strength and sensation are intact Psych: euthymic mood, full affect  Wt Readings from Last 3 Encounters:  05/27/18 188 lb (85.3 kg)  05/09/18 189 lb 2.5 oz (85.8 kg)  05/01/18 188 lb (85.3 kg)      Studies/Labs Reviewed:   EKG:  EKG is ordered today.  The ekg ordered today demonstrates normal sinus rhythm no significant ST-T wave change  Recent Labs: 03/25/2018: TSH 1.450 04/29/2018: ALT 24 05/09/2018: BUN 18; Creatinine, Ser 1.23; Hemoglobin 11.2; Platelets 178; Potassium 3.9; Sodium 141   Lipid Panel    Component Value Date/Time   CHOL 153 04/29/2018 1513   TRIG 70 04/29/2018 1513   HDL 61 04/29/2018 1513   CHOLHDL 2.5 04/29/2018 1513   CHOLHDL 3.6 11/22/2017 1030   VLDL 16 12/12/2016 1000   LDLCALC 78 04/29/2018 1513   LDLCALC 115 (H) 11/22/2017 1030    Additional studies/ records that were reviewed today include:   Cath 05/08/2018  Prox RCA lesion is 90% stenosed. Dist RCA lesion is 50% stenosed.  Seq SVG- RPDA-RPL graft was visualized by angiography and is large. -- CULPRIT LESIONS involve Ostial-Proximal Stent ISR & post stent  lesion.  SVG-RPDA-PL: Prox  Graft lesion is 75% stenosed just after stent  A drug-eluting stent was successfully placed using a STENT SYNERGY DES 3.5X12.  Post intervention, there is a 0% residual stenosis.  SVG-RPDA-PL: Origin to Prox Graft Stent is 70% stenosed.  Post intervention, there is a 0% residual stenosis.  Balloon angioplasty was performed using a BALLOON SAPPHIRE [place] 4.0X8.  =-=-=-=-=-=-=-=-=-  Mid LM to Ost LAD lesion is 95% stenosed.  Ost LAD to Prox LAD lesion is 75% stenosed. Prox LAD lesion is 100% stenosed.  Ost Cx lesion is 90% stenosed. Ost 1st Mrg lesion is 100% stenosed. Ost 2nd Mrg to 2nd Mrg lesion is 100% stenosed.  SVG-OM1-OM2 graft was visualized by angiography and is large. Origin to Prox Graft lesion before 1st Mrg is 20% stenosed.  LIMA graft was visualized by angiography and is normal in caliber.  =-=-=-=-=-=-=-=-=-  The left ventricular systolic function is normal. The left ventricular ejection fraction is 55-65% by visual estimate.  LV end diastolic pressure is normal.    In-stent restenosis followed by post-stent severe stenosis in the SVG-RPDA-RPL -> successful PTCA of in-stent restenosis with a second Synergy DES (3.5 mm x 12 mm) placed to cover the distal edge lesion.  Progression of proximal RCA, distal Left Main and ostial Circumflex disease.  Otherwise patent LIMA-LAD and SVG-OM1-OM 2.  Plan: The Brandon Vasquez will be monitored overnight and discharged tomorrow morning having restarted Brilinta at 90 mg twice daily. Hold metformin, use sliding scale insulin Anticipate discharge tomorrow and follow-up with either myself or Almyra Deforest, PA  Recommend uninterrupted dual antiplatelet therapy with Aspirin 81mg  daily and Ticagrelor 90mg  twice daily for a minimum of 12 months (ACS - Class I recommendation). -Given the fact that he denies stent overlapping in the vein graft with difficult to expand stent, we will continue on Brilinta at 60 mg after 6  months.  Can stop aspirin after 3-6 months. Would be okay to hold Brilinta 6 months post cath.   ASSESSMENT:    1. Coronary artery disease involving coronary bypass graft of native heart without angina pectoris   2. Essential hypertension   3. Dyslipidemia, goal LDL below 70   4. Controlled type 2 diabetes mellitus without complication, without long-term current use of insulin (HCC)      PLAN:  In order of problems listed above:  1. CAD s/p CABG: Continue aspirin and Brilinta.  Chest pain has resolved.  There has been some progression of underlying native coronary artery disease since 2017, will need to aggressively control cholesterol  2. Hypertension: Blood pressure stable  3. Hyperlipidemia: LDL remain elevated, increase activity level and cut back on fatty food, recheck fasting lipid panel and LFT in 2 months  4. DM2: On metformin, managed by primary care provider    Medication Adjustments/Labs and Tests Ordered: Current medicines are reviewed at length with the Brandon Vasquez today.  Concerns regarding medicines are outlined above.  Medication changes, Labs and Tests ordered today are listed in the Brandon Vasquez Instructions below. Brandon Vasquez Instructions  Medication Instructions:  Continue current medications  If you need a refill on your cardiac medications before your next appointment, please call your pharmacy.  Labwork: Fasting Lipids and Liver in 2 Months HERE IN OUR OFFICE AT [facility]  Take the provided lab slips with you to the lab for your blood draw.   You will need to fast. DO NOT EAT OR DRINK PAST MIDNIGHT.   Testing/Procedures: None Ordered   Follow-Up: Your physician wants you to follow-up in:  3 Months with Dr Ellyn Hack.      Thank you for choosing [facility] at [employer], [place]  05/27/2018 10:55 AM    [address], [place], [place]  [address] Phone: [phone]; Fax: [phone]

## 2018-05-28 NOTE — Addendum Note (Signed)
Addended by: Zebedee Iba on: 05/28/2018 02:31 PM   Modules accepted: Orders

## 2018-06-16 ENCOUNTER — Telehealth (HOSPITAL_COMMUNITY): Payer: Self-pay

## 2018-06-16 ENCOUNTER — Encounter: Payer: Self-pay | Admitting: Cardiology

## 2018-06-16 ENCOUNTER — Ambulatory Visit (INDEPENDENT_AMBULATORY_CARE_PROVIDER_SITE_OTHER): Payer: Medicare Other | Admitting: Cardiology

## 2018-06-16 VITALS — BP 130/64 | HR 71 | Ht 73.0 in | Wt 190.6 lb

## 2018-06-16 DIAGNOSIS — I25719 Atherosclerosis of autologous vein coronary artery bypass graft(s) with unspecified angina pectoris: Secondary | ICD-10-CM | POA: Diagnosis not present

## 2018-06-16 DIAGNOSIS — I209 Angina pectoris, unspecified: Secondary | ICD-10-CM

## 2018-06-16 DIAGNOSIS — I10 Essential (primary) hypertension: Secondary | ICD-10-CM

## 2018-06-16 DIAGNOSIS — I25119 Atherosclerotic heart disease of native coronary artery with unspecified angina pectoris: Secondary | ICD-10-CM

## 2018-06-16 DIAGNOSIS — E785 Hyperlipidemia, unspecified: Secondary | ICD-10-CM | POA: Diagnosis not present

## 2018-06-16 DIAGNOSIS — Z021 Encounter for pre-employment examination: Secondary | ICD-10-CM

## 2018-06-16 MED ORDER — ISOSORBIDE MONONITRATE ER 30 MG PO TB24: 30.0000 mg | ORAL_TABLET | Freq: Every day | ORAL | 3 refills | Status: DC

## 2018-06-16 NOTE — Assessment & Plan Note (Signed)
My feeling was that we could probably be fine after this most recent catheterization and this would cover him for his preop assessment.  However, I am concerned that he is still having some exercise related angina type symptoms.  I am therefore going to actually reassess with a Myoview to see if there is any evidence of ischemia.  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

## 2018-06-16 NOTE — Assessment & Plan Note (Addendum)
Blood pressure looks good on current meds.  I think okay to discontinue HCTZ since she is having a little bit of dizziness symptoms that he mentions near the end of the visit.  This will allow me to titrate up Imdur.  He will use HCTZ as PRN.

## 2018-06-16 NOTE — Progress Notes (Signed)
PCP: Patient, No Pcp Per  Clinic Note: Chief Complaint  Patient presents with  . Shortness of Breath    when active; chest burning.  low energy.      HPI: Brandon Vasquez is a 72 y.o. 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   Myoview stress test November 2018: EF 44%.  Medium size moderate severity fixed defect in the basal inferolateral mid inferolateral wall consistent with prior infarct with mild peri-infarct ischemia.  LOW RISK.  Progressive Angina - 05/08/2018: Cath - ISR SVG-RCA =-> DES PCI & PTCA; non-revascularized Native Cx - ostial 90% (mild progression)  He also has hypertension, hyperlipidemia and type 2 diabetes mellitus - Was converted from lisinopril to amlodipine plus HCTZ for hypotension/dizziness  Brandon Vasquez was just seen on September 24 by Almyra Deforest, PA for Vasquez-cath evaluation.Marland Kitchen  He saw Brandon Vasquez prior to that for symptoms of progressive angina then underwent cardiac catheterization.  Vasquez-cath he was placed on Brilinta - changed from Plavix, Crestor versus Pravachol amlodipine dose was increased along with Toprol 25 mg daily.  No further chest pain - just occasional "burning feeling" with activity.    Recent Hospitalizations: Cardiac cath 05/08/2018  Studies Personally Reviewed - (if available, images/films reviewed: From Epic Chart or Care Everywhere)  Cath-PCI:   In-stent restenosis followed by Vasquez-stent severe stenosis in the  SVG-RPDA-RPL -> successful PTCA of in-stent restenosis with a second Synergy DES (3.5 mm x 12 mm) placed to cover the distal edge lesion.  Progression of proximal RCA, distal Left Main and ostial Circumflex disease.  Otherwise patent LIMA-LAD and SVG-OM1-OM 2.    Interval History: Brandon Vasquez presents today for a previously scheduled appointment that was supposed be delayed till December.  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  He tells me that he just has not felt right since before his cath.  The severe limiting burning pressure sensation in his chest with exertion has definitely improved.  But he says if he does not take it easy during exercise like more the lawn, he will definitely feel that burning and had to stop.  That is certainly not gotten fully fully better.  In addition to getting the discomfort he actually gets short of breath as well. With rest he has no symptoms, he has no dyspnea or chest pain.  He denies any irregular heartbeats, palpitations or fluttering sensations.  No PND, orthopnea or edema.  No syncope/near syncope or TIA/amaurosis fugax.  What he really notes is that he just does not have a lot of get up and go.  No real energy, that is unusual for him. He is not having nearly the same dizziness that he had on lisinopril as compared amlodipine but he does note a little bit mild swelling.  No melena, hematochezia, hematuria or epistaxis.  No myalgias or arthralgias. No claudication  ROS: A comprehensive was performed. Review of Systems  Constitutional: Negative for malaise/fatigue (Just less energy than he used to have) and weight loss (Weight gain).  HENT: Negative for nosebleeds.   Respiratory: Negative for shortness of breath.   Cardiovascular:       Negative per HPI  Gastrointestinal: Negative for heartburn.  Genitourinary: Negative for dysuria.  Musculoskeletal: Positive for joint pain (Mild arthritis pains).    Neurological: Negative for dizziness.  Psychiatric/Behavioral: Negative.  Negative for memory loss. The patient is not nervous/anxious and does not have insomnia.   All other systems reviewed and are negative.  I have reviewed and (if needed) personally updated the patient's problem list, medications, allergies, past medical and surgical history, social and family history.   Past Medical History:  Diagnosis Date  . Coronary artery disease involving native heart with angina pectoris [facility]) 2005   a. 2005: CABG x5V in [place]  b. 04/2016: PCI/DES to SVG--> RCA. Severe Ost LCx (with CTO of grafted OMs - no retrograde filling) - distal Cx unprotected; RCA & LAD essentially CTO.;; 05/2018 - SVG-RCA ostial ISR with Vasquez-stent 80% - Overlapping DES & high Atm Vasquez-dilation of ISR. Progression of Native LM-ostLCx disease  . Essential hypertension   . HCV (hepatitis C virus)    "treated" (05/08/2018)  . HLD (hyperlipidemia)   . Hx of CABG 2001   [place] - CABG 4: LIMA-LAD, SeqSVG-OM1-OM2,SVG-rPDA  . MI (myocardial infarction) ([place]) 2001  . Non-ST elevation MI (NSTEMI) ([place]) 04/2016   Culprit lesion was 90% SVG-RPDA -> DES PCI. Also noted 80% ostial native circumflex with distal circumflex potentially in jeopardy -> MEDICAL Management.  . Type 2 diabetes mellitus with complication (HCC)    CAD    Past Surgical History:  Procedure Laterality Date  . CARDIAC CATHETERIZATION N/A 04/23/2016   Procedure: Left Heart Cath and Cors/Grafts Angiography;  Surgeon: Jettie Booze, MD;  Location: [place] [facility];  Service: Cardiovascular: ostLAD 75%->pLAD 100%, ostOM1 & OM2 100%, pRCA 80% -> dRCA 50%; LIMA-LAD patent, SVG-OM1-OM2 ~20% prox otw patentt, ost-pSVG-rPDA 90% --> PCI  . CARDIAC CATHETERIZATION N/A 04/23/2016   Procedure: Coronary Stent Intervention;  Surgeon: Jettie Booze, MD;  Location: [place] [facility];  Service: Cardiovascular;  Laterality: N/A;  DES PCI - 90% pSVG- PDA (Synergy DES  3.5 x 20)  . CORONARY ARTERY BYPASS GRAFT  2001   LIMA-LAD, SeqSVG-OM1-OM2,SVG-rPDA  . CORONARY STENT INTERVENTION N/A 05/08/2018   Procedure: CORONARY STENT INTERVENTION;  Surgeon: Leonie Man, MD;  Location: [place] [facility];  Service: Cardiovascular;; Ost SVG-RPDA-PL 70% ISR & 75-80% just beyond stent --> Synergy DES 3.5 x 12 (overlapping) with 4.0 mm Vasquez-dilation of overlap & entire old stent (especially the [place])  . LAPAROSCOPIC CHOLECYSTECTOMY    . LEFT HEART CATH AND CORS/GRAFTS ANGIOGRAPHY N/A 05/08/2018   Procedure: LEFT HEART CATH AND CORS/GRAFTS ANGIOGRAPHY;  Surgeon: Leonie Man, MD;  Location: [place] [facility];  Service: Cardiovascular;; SVG-RPDA-PL 70% ostial ISR & 75% aftter stent; Progression of dLM-OstLCx to ~90-95%.-> PCI of SVG-RPDA-PL. RCA & LAD CTO.  Patent SeqSVG-OM1-OM2 (native OMs ostial CTO - no retrograde flow), Patent LIMA-mLAD  . NM MYOVIEW LTD  07/2017   EF 44%.  Medium-sized, moderate severity fixed defect in basal inferolateral and mid inferolateral wall consistent with prior infarct and mild peri-infarct ischemia.  LOW RISK.  Marland Kitchen TRANSTHORACIC ECHOCARDIOGRAM  04/2016   EF 60-65%. GR 2 DD. Inferior hypokinesis. No significant valvular lesions     Current Meds  Medication Sig  . amLODipine (  NORVASC) 10 MG tablet Take 1 tablet (10 mg total) by mouth daily.  Marland Kitchen aspirin 81 MG EC tablet Take 81 mg by mouth daily. Swallow whole.  . ezetimibe (ZETIA) 10 MG tablet Take 10 mg by mouth daily.  Marland Kitchen HYDROcodone-acetaminophen (NORCO) 5-325 MG tablet Take 1 tablet by mouth every 6 (six) hours as needed for moderate pain.  . Hypromellose (ARTIFICIAL TEARS OP) Apply 1-2 drops to eye daily as needed (dry eyes).  . isosorbide mononitrate (IMDUR) 60 MG 24 hr tablet Take 1 tablet (60 mg total) by mouth daily.  . metFORMIN (GLUCOPHAGE-XR) 500 MG 24 hr tablet Take 500 mg by mouth daily.   . metoprolol succinate (TOPROL XL) 25 MG 24 hr tablet Take 1 tablet (25 mg total) by  mouth daily.  . nitroGLYCERIN (NITROSTAT) 0.4 MG SL tablet Place 1 tablet (0.4 mg total) under the tongue every 5 (five) minutes x 3 doses as needed for chest pain.  . rosuvastatin (CRESTOR) 40 MG tablet Take 1 tablet (40 mg total) by mouth daily.  . ticagrelor (BRILINTA) 90 MG TABS tablet Take 1 tablet (90 mg total) by mouth 2 (two) times daily.  . [DISCONTINUED] hydrochlorothiazide (HYDRODIURIL) 12.5 MG tablet Take 1 tablet (12.5 mg total) by mouth daily.    No Known Allergies   family history includes Alzheimer's disease in his father; Cerebral aneurysm in his mother; Hypertension in his mother.  Wt Readings from Last 3 Encounters:  06/16/18 190 lb 9.6 oz (86.5 kg)  05/27/18 188 lb (85.3 kg)  05/09/18 189 lb 2.5 oz (85.8 kg)  -- lost the taste of meat!   PHYSICAL EXAM BP 130/64   Pulse 71   Ht _0  (1.854 m)   Wt 190 lb 9.6 oz (86.5 kg)   SpO2 96%   BMI 25.15 kg/m  Physical Exam  Constitutional: He is oriented to person, place, and time. He appears well-developed and well-nourished. No distress.  Healthy appearing. Well groomed.  HENT:  Head: Normocephalic and atraumatic.  Neck: Normal range of motion. Neck supple. No hepatojugular reflux and no JVD present. Carotid bruit is not present.  Cardiovascular: Normal rate, regular rhythm, normal heart sounds, intact distal pulses and normal pulses.  No extrasystoles are present. PMI is not displaced. Exam reveals no gallop and no friction rub.  No murmur heard. Pulmonary/Chest: Effort normal and breath sounds normal. No respiratory distress. He has no wheezes. He has no rales.  Abdominal: Soft. Bowel sounds are normal. He exhibits no distension. There is no tenderness. There is no rebound.  Musculoskeletal: Normal range of motion. He exhibits no edema.  L pointer finger tip missing (has protective sleeve on).  Neurological: He is alert and oriented to person, place, and time.  Psychiatric: His behavior is normal. Judgment and  thought content normal.  Nursing note and vitals reviewed.   Adult ECG Report Not checked  Other studies Reviewed: Additional studies/ records that were reviewed today include:  Recent Labs:   --> Was converted to rosuvastatin during his last hospital stay -change from pravastatin Lab Results  Component Value Date   CHOL 153 04/29/2018   HDL 61 04/29/2018   LDLCALC 78 04/29/2018   TRIG 70 04/29/2018   CHOLHDL 2.5 04/29/2018   Lab Results  Component Value Date   TSH 1.450 03/25/2018   Lab Results  Component Value Date   CREATININE 1.23 05/09/2018   BUN 18 05/09/2018   NA 141 05/09/2018   K 3.9 05/09/2018   CL 107  05/09/2018   CO2 25 05/09/2018    ASSESSMENT / PLAN: Problem List Items Addressed This Visit    Angina, 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  He is already on amlodipine and Toprol.  I do not think we can titrate the Toprol to much more because he is having fatigue. We will simply increase Imdur to 90 mg.  Next option would be Ranexa versus PCI depending on what his stress test shows. 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        Relevant Medications   isosorbide mononitrate (IMDUR) 30 MG 24 hr tablet   Other Relevant Orders   MYOCARDIAL PERFUSION IMAGING   Coronary artery disease involving autologous vein coronary bypass graft with angina pectoris ([place]) - Primary (Chronic)    He is currently having class I to may be been class II symptoms.  Certainly much better than the progressive symptoms he was having prior to his last stent.  I am a little bit concerned that he has not had notable improvement.  The vein graft stent was very difficult to Vasquez dilate and I wonder if that could be closing by down again.  I am also thinking that if that graft is okay, the only remaining potential PCI target would be the distal left main into ostial circumflex which would require atherectomy.   At this point, I would like to try to titrate up his  medications by increasing his Imdur to 90 mg.  He will continue cardiac rehab and I will see him back upon completion of cardiac rehab to see how symptoms are doing. 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      Relevant Medications   isosorbide mononitrate (IMDUR) 30 MG 24 hr tablet   Other Relevant Orders   MYOCARDIAL PERFUSION IMAGING   Coronary artery disease involving native heart with angina pectoris ([place]) (Chronic)    He is on Crestor now and was switched to Brilinta from aspirin after last stent. On amlodipine and metoprolol as well as Imdur that we are increasing to 90 mg. Existing native left main to circumflex is the only remaining non-revascularized territory if his vein graft is patent on relook.  This does seem to have progressed from last Cath and this could be the source of symptoms.  Depending on his stress test evaluation, may need to consider relook cath with atherectomy based PCI if the vein graft looks okay.      Relevant Medications   isosorbide mononitrate (IMDUR) 30 MG 24 hr tablet   Dyslipidemia, goal LDL below 70 (Chronic)    He was converted from pravastatin to Crestor last visit when I saw him, because his LDL was still above 70. We can reassess this in about 3 months.  Continue Zetia.      Relevant Medications   isosorbide mononitrate (IMDUR) 30 MG 24 hr tablet   Essential hypertension (Chronic)    Blood pressure looks good on current meds.  I think okay to discontinue HCTZ since she is having a little bit of dizziness symptoms that he mentions near the end of the visit.  This will allow me to titrate up Imdur.  He will use  HCTZ as PRN.      Relevant Medications   isosorbide mononitrate (IMDUR) 30 MG 24 hr tablet    Pre-employment examination    My feeling was that we could probably be fine after this most recent catheterization and this would cover him for his preop assessment.  However, I am concerned that he is still having some exercise related angina type symptoms.  I am therefore going to actually reassess with a Myoview to see if there is any evidence of ischemia.  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      Relevant Orders   MYOCARDIAL PERFUSION IMAGING      Current medicines are reviewed at length with the patient today. (+/- concerns) n/a The following changes have been made:DC HCTZ, increase Imdur to 90 mg.  Patient Instructions  Medication Instructions:  STOP TAKING HCTZ -- HYDROCHLOROTHIAZIDE  TAKE 60 MG AND 30 MG TABLETS TO EQUAL 90 MG ISOSORBIDE MN/  If you need a refill on your cardiac medications before your next appointment, please call your pharmacy.   Lab work:  NOT NEEDED If you have labs (blood work) drawn today and your tests are completely normal, you will receive your results only by: Marland Kitchen MyChart Message (if you have MyChart) OR . A paper copy in the mail If you have any lab test that is abnormal or we need to change your treatment, we will call you to review the results.  Testing/Procedures: SCHEDULE [place] OF NOV 2019 Your physician has requested that you have en exercise stress myoview. For further information please visit [email or web address]. Please follow instruction sheet, as given.   Follow-Up: At [facility] you and your health needs are our priority.  As part of our continuing mission to provide you with exceptional heart care, we have created designated Provider Care Teams.  These Care Teams include your primary Cardiologist (physician) and Advanced Practice Providers (APPs -  Physician Assistants and  Nurse Practitioners) who all work together to provide you with the care you need, when you need it. . Your physician recommends that you schedule a follow-up appointment in [place] 2019 [place]. .   Any Other Special Instructions Will Be Listed Below (If Applicable).     Studies Ordered:   Orders Placed This Encounter  Procedures  . MYOCARDIAL PERFUSION IMAGING      [name], M.D., M.S. Interventional Cardiologist   Pager # [phone] Phone # [phone] [address] [place], [place] [address]

## 2018-06-16 NOTE — Assessment & Plan Note (Signed)
He is currently having class I to may be been class II symptoms.  Certainly much better than the progressive symptoms he was having prior to his last stent.  I am a little bit concerned that he has not had notable improvement.  The vein graft stent was very difficult to post dilate and I wonder if that could be closing by down again.  I am also thinking that if that graft is okay, the only remaining potential PCI target would be the distal left main into ostial circumflex which would require atherectomy.   At this point, I would like to try to titrate up his medications by increasing his Imdur to 90 mg.  He will continue cardiac rehab and I will see him back upon completion of cardiac rehab to see how symptoms are doing. 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

## 2018-06-16 NOTE — Assessment & Plan Note (Addendum)
Class I if not 2 symptoms.  He is already on amlodipine and Toprol.  I do not think we can titrate the Toprol to much more because he is having fatigue. We will simply increase Imdur to 90 mg.  Next option would be Ranexa versus PCI depending on what his stress test shows. 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

## 2018-06-16 NOTE — Assessment & Plan Note (Signed)
He is on Crestor now and was switched to Brilinta from aspirin after last stent. On amlodipine and metoprolol as well as Imdur that we are increasing to 90 mg. Existing native left main to circumflex is the only remaining non-revascularized territory if his vein graft is patent on relook.  This does seem to have progressed from last Cath and this could be the source of symptoms.  Depending on his stress test evaluation, may need to consider relook cath with atherectomy based PCI if the vein graft looks okay.

## 2018-06-16 NOTE — Assessment & Plan Note (Signed)
He was converted from pravastatin to Crestor last visit when I saw him, because his LDL was still above 70. We can reassess this in about 3 months.  Continue Zetia.

## 2018-06-16 NOTE — Patient Instructions (Signed)
Medication Instructions:  STOP TAKING HCTZ -- HYDROCHLOROTHIAZIDE  TAKE 60 MG AND 30 MG TABLETS TO EQUAL 90 MG ISOSORBIDE MN/  If you need a refill on your cardiac medications before your next appointment, please call your pharmacy.   Lab work:  NOT NEEDED If you have labs (blood work) drawn today and your tests are completely normal, you will receive your results only by: Marland Kitchen MyChart Message (if you have MyChart) OR . A paper copy in the mail If you have any lab test that is abnormal or we need to change your treatment, we will call you to review the results.  Testing/Procedures: SCHEDULE [place] OF NOV 2019 Your physician has requested that you have en exercise stress myoview. For further information please visit [email or web address]. Please follow instruction sheet, as given.   Follow-Up: At [facility], you and your health needs are our priority.  As part of our continuing mission to provide you with exceptional heart care, we have created designated Provider Care Teams.  These Care Teams include your primary Cardiologist (physician) and Advanced Practice Providers (APPs -  Physician Assistants and Nurse Practitioners) who all work together to provide you with the care you need, when you need it. . Your physician recommends that you schedule a follow-up appointment in [place] 2019 [place]. .   Any Other Special Instructions Will Be Listed Below (If Applicable).

## 2018-07-02 ENCOUNTER — Telehealth (HOSPITAL_COMMUNITY): Payer: Self-pay

## 2018-07-02 NOTE — Telephone Encounter (Signed)
Encounter complete. 

## 2018-07-04 ENCOUNTER — Telehealth (HOSPITAL_COMMUNITY): Payer: Self-pay

## 2018-07-04 ENCOUNTER — Telehealth: Payer: Self-pay | Admitting: Cardiology

## 2018-07-04 HISTORY — PX: NM MYOVIEW LTD: HXRAD82

## 2018-07-04 MED ORDER — EZETIMIBE 10 MG PO TABS: 10.0000 mg | ORAL_TABLET | Freq: Every day | ORAL | 6 refills | Status: DC

## 2018-07-04 NOTE — Telephone Encounter (Signed)
Encounter complete. 

## 2018-07-04 NOTE — Telephone Encounter (Signed)
Rx request sent to pharmacy.  

## 2018-07-04 NOTE — Telephone Encounter (Signed)
°*  STAT* If patient is at the pharmacy, call can be transferred to refill team.   1. Which medications need to be refilled? (please list name of each medication and dose if known)  ezetimibe (ZETIA) 10 MG tablet  2. Which pharmacy/location (including street and city if local pharmacy) is medication to be sent to? [place], [place] [employer] [facility]  3. Do they need a 30 day or 90 day supply? 30 days  PATIENT IS CURRENTLY OUT OF HIS MEDICATION

## 2018-07-08 ENCOUNTER — Encounter (HOSPITAL_COMMUNITY): Payer: Self-pay

## 2018-07-08 ENCOUNTER — Ambulatory Visit (HOSPITAL_COMMUNITY)
Admission: RE | Admit: 2018-07-08 | Discharge: 2018-07-08 | Disposition: A | Discharge status: Home / Self Care | Payer: Medicare Other | Source: Ambulatory Visit | Attending: Internal Medicine | Admitting: Internal Medicine

## 2018-07-08 DIAGNOSIS — I25719 Atherosclerosis of autologous vein coronary artery bypass graft(s) with unspecified angina pectoris: Secondary | ICD-10-CM | POA: Insufficient documentation

## 2018-07-08 DIAGNOSIS — Z021 Encounter for pre-employment examination: Secondary | ICD-10-CM | POA: Insufficient documentation

## 2018-07-08 DIAGNOSIS — I209 Angina pectoris, unspecified: Secondary | ICD-10-CM | POA: Insufficient documentation

## 2018-07-08 NOTE — Progress Notes (Signed)
Brandon Vasquez arrived for his NUC MPI today. Pt reported that he had coffee this morning. Pt given duplicate instructions and rescheduled for NUC MPI.

## 2018-07-09 ENCOUNTER — Telehealth (HOSPITAL_COMMUNITY): Payer: Self-pay | Admitting: Pharmacist

## 2018-07-09 ENCOUNTER — Telehealth (HOSPITAL_COMMUNITY): Payer: Self-pay

## 2018-07-09 ENCOUNTER — Telehealth: Payer: Self-pay | Admitting: Cardiology

## 2018-07-09 DIAGNOSIS — Z0100 Encounter for examination of eyes and vision without abnormal findings: Secondary | ICD-10-CM | POA: Diagnosis not present

## 2018-07-09 DIAGNOSIS — E7800 Pure hypercholesterolemia, unspecified: Secondary | ICD-10-CM

## 2018-07-09 DIAGNOSIS — E119 Type 2 diabetes mellitus without complications: Secondary | ICD-10-CM | POA: Diagnosis not present

## 2018-07-09 DIAGNOSIS — I10 Essential (primary) hypertension: Secondary | ICD-10-CM | POA: Diagnosis not present

## 2018-07-09 DIAGNOSIS — R5383 Other fatigue: Secondary | ICD-10-CM | POA: Diagnosis not present

## 2018-07-09 DIAGNOSIS — E079 Disorder of thyroid, unspecified: Secondary | ICD-10-CM | POA: Insufficient documentation

## 2018-07-09 DIAGNOSIS — B182 Chronic viral hepatitis C: Secondary | ICD-10-CM | POA: Diagnosis not present

## 2018-07-09 DIAGNOSIS — Z125 Encounter for screening for malignant neoplasm of prostate: Secondary | ICD-10-CM | POA: Diagnosis not present

## 2018-07-09 NOTE — Telephone Encounter (Signed)
Patient called and needed to reschedule due to another appt. Pt will come in for orientation 08/07/18 @ 8:30AM and will attend 2:45PM exercise class.

## 2018-07-09 NOTE — Telephone Encounter (Signed)
Error no note needed °

## 2018-07-10 ENCOUNTER — Telehealth (HOSPITAL_COMMUNITY): Payer: Self-pay

## 2018-07-10 NOTE — Telephone Encounter (Signed)
Encounter complete. 

## 2018-07-15 ENCOUNTER — Ambulatory Visit (HOSPITAL_COMMUNITY)
Admission: RE | Admit: 2018-07-15 | Discharge: 2018-07-15 | Disposition: A | Discharge status: Home / Self Care | Payer: Medicare Other | Source: Ambulatory Visit | Attending: Internal Medicine | Admitting: Internal Medicine

## 2018-07-15 ENCOUNTER — Ambulatory Visit (HOSPITAL_COMMUNITY): Payer: Medicare Other

## 2018-07-15 DIAGNOSIS — I209 Angina pectoris, unspecified: Secondary | ICD-10-CM

## 2018-07-15 DIAGNOSIS — I25719 Atherosclerosis of autologous vein coronary artery bypass graft(s) with unspecified angina pectoris: Secondary | ICD-10-CM

## 2018-07-15 DIAGNOSIS — Z021 Encounter for pre-employment examination: Secondary | ICD-10-CM | POA: Insufficient documentation

## 2018-07-15 LAB — MYOCARDIAL PERFUSION IMAGING
CHL CUP RESTING HR STRESS: 63 {beats}/min
CSEPPHR: 86 {beats}/min
LV sys vol: 61 mL
LVDIAVOL: 118 mL (ref 62–150)
SDS: 5
SRS: 1
SSS: 6
TID: 1.06

## 2018-07-15 MED ORDER — REGADENOSON 0.4 MG/5ML IV SOLN
0.4000 mg | Freq: Once | INTRAVENOUS | Status: AC
  Administered 2018-07-15: 0.4 mg via INTRAVENOUS

## 2018-07-15 MED ORDER — TECHNETIUM TC 99M TETROFOSMIN IV KIT
10.1000 | PACK | Freq: Once | INTRAVENOUS | Status: AC | PRN
  Administered 2018-07-15: 10.1 via INTRAVENOUS
  Filled 2018-07-15: qty 11

## 2018-07-15 MED ORDER — TECHNETIUM TC 99M TETROFOSMIN IV KIT
31.0000 | PACK | Freq: Once | INTRAVENOUS | Status: AC | PRN
  Administered 2018-07-15: 31 via INTRAVENOUS
  Filled 2018-07-15: qty 31

## 2018-07-21 ENCOUNTER — Ambulatory Visit (HOSPITAL_COMMUNITY): Payer: Medicare Other

## 2018-07-23 ENCOUNTER — Ambulatory Visit (HOSPITAL_COMMUNITY): Payer: Medicare Other

## 2018-07-25 ENCOUNTER — Ambulatory Visit (HOSPITAL_COMMUNITY): Payer: Medicare Other

## 2018-07-28 ENCOUNTER — Ambulatory Visit (HOSPITAL_COMMUNITY): Payer: Medicare Other

## 2018-07-28 DIAGNOSIS — H04123 Dry eye syndrome of bilateral lacrimal glands: Secondary | ICD-10-CM | POA: Diagnosis not present

## 2018-07-28 DIAGNOSIS — H2513 Age-related nuclear cataract, bilateral: Secondary | ICD-10-CM | POA: Diagnosis not present

## 2018-07-28 DIAGNOSIS — E119 Type 2 diabetes mellitus without complications: Secondary | ICD-10-CM | POA: Diagnosis not present

## 2018-07-29 NOTE — Progress Notes (Signed)
Brandon Vasquez 72 y.o. male DOB July 31, 1946 MRN [id number]       Nutrition  No diagnosis found. Past Medical History:  Diagnosis Date  . Coronary artery disease involving native heart with angina pectoris [facility]) 2005   a. 2005: CABG x5V in [place]  b. 04/2016: PCI/DES to SVG--> RCA. Severe Ost LCx (with CTO of grafted OMs - no retrograde filling) - distal Cx unprotected; RCA & LAD essentially CTO.;; 05/2018 - SVG-RCA ostial ISR with post-stent 80% - Overlapping DES & high Atm post-dilation of ISR. Progression of Native LM-ostLCx disease  . Essential hypertension   . HCV (hepatitis C virus)    "treated" (05/08/2018)  . HLD (hyperlipidemia)   . Hx of CABG 2001   [place] - CABG 4: LIMA-LAD, SeqSVG-OM1-OM2,SVG-rPDA  . MI (myocardial infarction) ([place]) 2001  . Non-ST elevation MI (NSTEMI) ([place]) 04/2016   Culprit lesion was 90% SVG-RPDA -> DES PCI. Also noted 80% ostial native circumflex with distal circumflex potentially in jeopardy -> MEDICAL Management.  . Type 2 diabetes mellitus with complication ([place])    CAD   Meds reviewed.    Current Outpatient Medications (Endocrine & Metabolic):  .  metFORMIN (GLUCOPHAGE-XR) 500 MG 24 hr tablet, Take 500 mg by mouth daily.   Current Outpatient Medications (Cardiovascular):  .  amLODipine (NORVASC) 10 MG tablet, Take 1 tablet (10 mg total) by mouth daily. Marland Kitchen  ezetimibe (ZETIA) 10 MG tablet, Take 1 tablet (10 mg total) by mouth daily. .  isosorbide mononitrate (IMDUR) 30 MG 24 hr tablet, Take 1 tablet (30 mg total) by mouth daily. .  isosorbide mononitrate (IMDUR) 60 MG 24 hr tablet, Take 1 tablet (60 mg total) by mouth daily. .  metoprolol succinate (TOPROL XL) 25 MG 24 hr tablet, Take 1 tablet (25 mg total) by mouth daily. .  nitroGLYCERIN (NITROSTAT) 0.4 MG SL tablet, Place 1 tablet (0.4 mg total) under the tongue every 5 (five) minutes x 3 doses as needed for chest pain. .  rosuvastatin (CRESTOR) 40 MG tablet, Take 1 tablet (40 mg total) by mouth  daily.   Current Outpatient Medications (Analgesics):  .  aspirin 81 MG EC tablet, Take 81 mg by mouth daily. Swallow whole. Marland Kitchen  HYDROcodone-acetaminophen (NORCO) 5-325 MG tablet, Take 1 tablet by mouth every 6 (six) hours as needed for moderate pain.  Current Outpatient Medications (Hematological):  .  ticagrelor (BRILINTA) 90 MG TABS tablet, Take 1 tablet (90 mg total) by mouth 2 (two) times daily.  Current Outpatient Medications (Other):  Marland Kitchen  Hypromellose (ARTIFICIAL TEARS OP), Apply 1-2 drops to eye daily as needed (dry eyes).   HT: Ht Readings from Last 1 Encounters:  07/15/18 _0  (1.854 m)    WT: Wt Readings from Last 5 Encounters:  07/15/18 190 lb (86.2 kg)  06/16/18 190 lb 9.6 oz (86.5 kg)  05/27/18 188 lb (85.3 kg)  05/09/18 189 lb 2.5 oz (85.8 kg)  05/01/18 188 lb (85.3 kg)     BMI 25.15  06/16/18   Current tobacco use? No       Labs:  Lipid Panel     Component Value Date/Time   CHOL 153 04/29/2018 1513   TRIG 70 04/29/2018 1513   HDL 61 04/29/2018 1513   CHOLHDL 2.5 04/29/2018 1513   CHOLHDL 3.6 11/22/2017 1030   VLDL 16 12/12/2016 1000   LDLCALC 78 04/29/2018 1513   LDLCALC 115 (H) 11/22/2017 1030    HGBA1C   6.4     07/09/18  CBG (last 3)  No results for input(s): GLUCAP in the last 72 hours.  Nutrition Diagnosis ? Food-and nutrition-related knowledge deficit related to lack of exposure to information as related to diagnosis of: ? CVD ? Type 2 Diabetes     Nutrition Goal(s):  ? To be determined  Plan:  Pt to attend nutrition classes ? Nutrition I ? Nutrition II ? Portion Distortion  ? Diabetes Blitz ? Diabetes Q & A Will provide client-centered nutrition education as part of interdisciplinary care.   Monitor and evaluate progress toward nutrition goal with team.  Laurina Bustle, MS, RD, LDN 07/29/2018 6:31 PM

## 2018-07-30 ENCOUNTER — Ambulatory Visit (HOSPITAL_COMMUNITY): Payer: Medicare Other

## 2018-07-30 ENCOUNTER — Telehealth (HOSPITAL_COMMUNITY): Payer: Self-pay

## 2018-08-01 ENCOUNTER — Ambulatory Visit (HOSPITAL_COMMUNITY): Payer: Medicare Other

## 2018-08-04 ENCOUNTER — Ambulatory Visit (HOSPITAL_COMMUNITY): Payer: Medicare Other

## 2018-08-04 NOTE — Addendum Note (Signed)
Addended by: Servando Salina on: 08/04/2018 12:53 PM   Modules accepted: Orders

## 2018-08-04 NOTE — Telephone Encounter (Signed)
Cardiac Rehab Medication Review by a Pharmacist  Does the patient  feel that his/her medications are working for him/her?  yes  Has the patient been experiencing any side effects to the medications prescribed?  Yes, light headedness and fatigue   Does the patient measure his/her own blood pressure or blood glucose at home?  No; encouraged patient to check blood pressure and blood glucose more regularly  Does the patient have any problems obtaining medications due to transportation or finances?   no  Understanding of regimen: good Understanding of indications: good Potential of compliance: good    Pharmacist comments: When asked about side effects patient was not sure if it's from "medications or old age." Patient does report light headedness occasionally. Patient does not currently check his blood pressure. Patient will take his blood pressure when he feels this way and will report number to Cardiologist office, especially if too low (SBP<100, DBP<60, HR<60).    [name], [place] D PGY1 Pharmacy Resident   08/04/2018      12:48 PM

## 2018-08-06 ENCOUNTER — Ambulatory Visit (HOSPITAL_COMMUNITY): Payer: Medicare Other

## 2018-08-07 ENCOUNTER — Encounter (HOSPITAL_COMMUNITY): Payer: Self-pay

## 2018-08-07 ENCOUNTER — Encounter (HOSPITAL_COMMUNITY)
Admission: RE | Admit: 2018-08-07 | Discharge: 2018-08-07 | Disposition: A | Discharge status: Home / Self Care | Payer: Medicare Other | Source: Ambulatory Visit | Attending: Cardiology | Admitting: Cardiology

## 2018-08-07 VITALS — BP 118/78 | HR 77 | Ht 71.5 in | Wt 197.5 lb

## 2018-08-07 DIAGNOSIS — I25119 Atherosclerotic heart disease of native coronary artery with unspecified angina pectoris: Secondary | ICD-10-CM | POA: Diagnosis not present

## 2018-08-07 DIAGNOSIS — Z87891 Personal history of nicotine dependence: Secondary | ICD-10-CM | POA: Diagnosis not present

## 2018-08-07 DIAGNOSIS — E785 Hyperlipidemia, unspecified: Secondary | ICD-10-CM | POA: Insufficient documentation

## 2018-08-07 DIAGNOSIS — I10 Essential (primary) hypertension: Secondary | ICD-10-CM | POA: Diagnosis not present

## 2018-08-07 DIAGNOSIS — E119 Type 2 diabetes mellitus without complications: Secondary | ICD-10-CM | POA: Diagnosis not present

## 2018-08-07 DIAGNOSIS — I214 Non-ST elevation (NSTEMI) myocardial infarction: Secondary | ICD-10-CM | POA: Insufficient documentation

## 2018-08-07 DIAGNOSIS — Z955 Presence of coronary angioplasty implant and graft: Secondary | ICD-10-CM | POA: Insufficient documentation

## 2018-08-07 NOTE — Progress Notes (Signed)
Cardiac Individual Treatment Plan  Patient Details  Name: Brandon Vasquez MRN: [id number] Date of Birth: 1946-01-06 Referring Provider:     [place] from 08/07/2018 in [place]  Referring Provider  Dr. Ellyn Hack      Initial Encounter Date:    CARDIAC REHAB PHASE II ORIENTATION from 08/07/2018 in [place]  Date  08/07/18      Visit Diagnosis: 05/09/2018 Stented coronary artery S/P DES RPDA Graft  Patient's Home Medications on Admission:  Current Outpatient Medications:  .  amLODipine (NORVASC) 10 MG tablet, Take 1 tablet (10 mg total) by mouth daily., Disp: 90 tablet, Rfl: 4 .  aspirin 81 MG EC tablet, Take 81 mg by mouth daily. Swallow whole., Disp: , Rfl:  .  ezetimibe (ZETIA) 10 MG tablet, Take 1 tablet (10 mg total) by mouth daily., Disp: 30 tablet, Rfl: 6 .  HYDROcodone-acetaminophen (NORCO) 5-325 MG tablet, Take 1 tablet by mouth every 6 (six) hours as needed for moderate pain. (Patient not taking: Reported on 08/04/2018), Disp: 10 tablet, Rfl: 0 .  Hypromellose (ARTIFICIAL TEARS OP), Apply 1-2 drops to eye daily as needed (dry eyes)., Disp: , Rfl:  .  isosorbide mononitrate (IMDUR) 60 MG 24 hr tablet, Take 1 tablet (60 mg total) by mouth daily., Disp: 90 tablet, Rfl: 3 .  metFORMIN (GLUCOPHAGE-XR) 500 MG 24 hr tablet, Take 500 mg by mouth daily. , Disp: , Rfl:  .  metoprolol succinate (TOPROL XL) 25 MG 24 hr tablet, Take 1 tablet (25 mg total) by mouth daily., Disp: 60 tablet, Rfl: 6 .  nitroGLYCERIN (NITROSTAT) 0.4 MG SL tablet, Place 1 tablet (0.4 mg total) under the tongue every 5 (five) minutes x 3 doses as needed for chest pain. (Patient not taking: Reported on 08/04/2018), Disp: 25 tablet, Rfl: 6 .  rosuvastatin (CRESTOR) 40 MG tablet, Take 1 tablet (40 mg total) by mouth daily., Disp: 90 tablet, Rfl: 3 .  ticagrelor (BRILINTA) 90 MG TABS tablet, Take 1 tablet (90 mg total) by mouth 2  (two) times daily., Disp: 60 tablet, Rfl: 5  Past Medical History: Past Medical History:  Diagnosis Date  . Coronary artery disease involving native heart with angina pectoris [facility]) 2005   a. 2005: CABG x5V in [place]  b. 04/2016: PCI/DES to SVG--> RCA. Severe Ost LCx (with CTO of grafted OMs - no retrograde filling) - distal Cx unprotected; RCA & LAD essentially CTO.;; 05/2018 - SVG-RCA ostial ISR with post-stent 80% - Overlapping DES & high Atm post-dilation of ISR. Progression of Native LM-ostLCx disease  . Essential hypertension   . HCV (hepatitis C virus)    "treated" (05/08/2018)  . HLD (hyperlipidemia)   . Hx of CABG 2001   [place] - CABG 4: LIMA-LAD, SeqSVG-OM1-OM2,SVG-rPDA  . MI (myocardial infarction) ([place]) 2001  . Non-ST elevation MI (NSTEMI) ([place]) 04/2016   Culprit lesion was 90% SVG-RPDA -> DES PCI. Also noted 80% ostial native circumflex with distal circumflex potentially in jeopardy -> MEDICAL Management.  . Type 2 diabetes mellitus with complication (HCC)    CAD    Tobacco Use: Social History   Tobacco Use  Smoking Status Former Smoker  . Packs/day: 1.00  . Years: 30.00  . Pack years: 30.00  . Types: Cigarettes  . Last attempt to quit: 07/10/1996  . Years since quitting: 22.0  Smokeless Tobacco Never Used    Labs: Recent Review [employer]  for ITP Cardiac and Pulmonary Rehab Latest Ref Rng & Units 04/22/2016 12/12/2016 03/28/2017 11/22/2017 04/29/2018   Cholestrol 100 - 199 mg/dL 162 147 - 193 153   LDLCALC 0 - 99 mg/dL 95 73 - 115(H) 78   HDL >39 mg/dL 53 58 - 54 61   Trlycerides 0 - 149 mg/dL 69 82 - 126 70   TCO2 0 - 100 mmol/L - - 24 - -      Capillary Blood Glucose: Lab Results  Component Value Date   GLUCAP 98 05/09/2018   GLUCAP 86 05/09/2018   GLUCAP 132 (H) 05/08/2018   GLUCAP 83 05/08/2018   GLUCAP 84 05/08/2018     Exercise Target Goals: Exercise Program Goal: Individual exercise prescription set using results from initial 6 min  walk test and THRR while considering  patient's activity barriers and safety.   Exercise Prescription Goal: Initial exercise prescription builds to 30-45 minutes a day of aerobic activity, 2-3 days per week.  Home exercise guidelines will be given to patient during program as part of exercise prescription that the participant will acknowledge.  Activity Barriers & Risk Stratification: Activity Barriers & Cardiac Risk Stratification - 08/07/18 1119      Activity Barriers & Cardiac Risk Stratification   Activity Barriers  None    Cardiac Risk Stratification  Moderate       6 Minute Walk: 6 Minute Walk    Row Name 08/07/18 1119         6 Minute Walk   Phase  Initial     Distance  1200 feet     Walk Time  6 minutes     # of Rest Breaks  0     MPH  2.27     METS  2.54     RPE  9     VO2 Peak  8.89     Symptoms  No     Resting HR  66 bpm     Resting BP  118/78     Resting Oxygen Saturation   99 %     Exercise Oxygen Saturation  during 6 min walk  98 %     Max Ex. HR  81 bpm     Max Ex. BP  124/82     2 Minute Post BP  130/74        Oxygen Initial Assessment:   Oxygen Re-Evaluation:   Oxygen Discharge (Final Oxygen Re-Evaluation):   Initial Exercise Prescription: Initial Exercise Prescription - 08/07/18 1100      Date of Initial Exercise RX and Referring Provider   Date  08/07/18    Referring Provider  Dr. Ellyn Hack    Expected Discharge Date  11/12/18      Recumbant Bike   Level  0.7    Minutes  10    METs  2.47      NuStep   Level  2    SPM  75    Minutes  10    METs  2.3      Track   Laps  9    Minutes  10    METs  2.53      Prescription Details   Frequency (times per week)  3    Duration  Progress to 30 minutes of continuous aerobic without signs/symptoms of physical distress      Intensity   THRR 40-80% of Max Heartrate  60-119    Ratings of Perceived Exertion  11-13  Progression   Progression  Continue to progress workloads to  maintain intensity without signs/symptoms of physical distress.      Resistance Training   Training Prescription  Yes    Weight  3 lbs.     Reps  10-15       Perform Capillary Blood Glucose checks as needed.  Exercise Prescription Changes:   Exercise Comments:   Exercise Goals and Review: Exercise Goals    Row Name 08/07/18 1120             Exercise Goals   Increase Physical Activity  Yes       Intervention  Provide advice, education, support and counseling about physical activity/exercise needs.;Develop an individualized exercise prescription for aerobic and resistive training based on initial evaluation findings, risk stratification, comorbidities and participant's personal goals.       Expected Outcomes  Short Term: Attend rehab on a regular basis to increase amount of physical activity.       Increase Strength and Stamina  Yes       Intervention  Provide advice, education, support and counseling about physical activity/exercise needs.;Develop an individualized exercise prescription for aerobic and resistive training based on initial evaluation findings, risk stratification, comorbidities and participant's personal goals.       Expected Outcomes  Short Term: Increase workloads from initial exercise prescription for resistance, speed, and METs.       Able to understand and use rate of perceived exertion (RPE) scale  Yes       Intervention  Provide education and explanation on how to use RPE scale       Expected Outcomes  Short Term: Able to use RPE daily in rehab to express subjective intensity level;Long Term:  Able to use RPE to guide intensity level when exercising independently       Knowledge and understanding of Target Heart Rate Range (THRR)  Yes       Intervention  Provide education and explanation of THRR including how the numbers were predicted and where they are located for reference       Expected Outcomes  Short Term: Able to state/look up THRR;Long Term: Able to  use THRR to govern intensity when exercising independently;Short Term: Able to use daily as guideline for intensity in rehab       Able to check pulse independently  Yes       Intervention  Provide education and demonstration on how to check pulse in carotid and radial arteries.;Review the importance of being able to check your own pulse for safety during independent exercise       Expected Outcomes  Short Term: Able to explain why pulse checking is important during independent exercise;Long Term: Able to check pulse independently and accurately       Understanding of Exercise Prescription  Yes       Intervention  Provide education, explanation, and written materials on patient's individual exercise prescription       Expected Outcomes  Short Term: Able to explain program exercise prescription;Long Term: Able to explain home exercise prescription to exercise independently          Exercise Goals Re-Evaluation :   Discharge Exercise Prescription (Final Exercise Prescription Changes):   Nutrition:  Target Goals: Understanding of nutrition guidelines, daily intake of sodium <1570m, cholesterol <2063m calories 30% from fat and 7% or less from saturated fats, daily to have 5 or more servings of fruits and vegetables.  Biometrics: Pre Biometrics - 08/07/18 1120  Pre Biometrics   Height  5' 11.5" (1.816 m)    Weight  89.6 kg    Waist Circumference  38.5 inches    Hip Circumference  41.4 inches    Waist to Hip Ratio  0.93 %    BMI (Calculated)  27.17    Triceps Skinfold  20 mm    % Body Fat  27.6 %    Grip Strength  35 kg    Flexibility  7 in    Single Leg Stand  5.31 seconds        Nutrition Therapy Plan and Nutrition Goals: Nutrition Therapy & Goals - 08/07/18 1103      Nutrition Therapy   Diet  heart healthy, diabetic      Personal Nutrition Goals   Nutrition Goal  Pt to identify and limit food sources of saturated fat, trans fat, refined carbohydrates and sodium     Personal Goal #2  Pt to describe the benefit of including fruits, vegetables, whole grains, and low-fat dairy products in a heart healthy meal plan.    Personal Goal #3  Pt able to name foods that affect blood glucose.      Intervention Plan   Intervention  Prescribe, educate and counsel regarding individualized specific dietary modifications aiming towards targeted core components such as weight, hypertension, lipid management, diabetes, heart failure and other comorbidities.    Expected Outcomes  Short Term Goal: Understand basic principles of dietary content, such as calories, fat, sodium, cholesterol and nutrients.;Long Term Goal: Adherence to prescribed nutrition plan.       Nutrition Assessments: Nutrition Assessments - 08/07/18 1104      MEDFICTS Scores   Pre Score  30       Nutrition Goals Re-Evaluation:   Nutrition Goals Re-Evaluation:   Nutrition Goals Discharge (Final Nutrition Goals Re-Evaluation):   Psychosocial: Target Goals: Acknowledge presence or absence of significant depression and/or stress, maximize coping skills, provide positive support system. Participant is able to verbalize types and ability to use techniques and skills needed for reducing stress and depression.  Initial Review & Psychosocial Screening: Initial Psych Review & Screening - 08/07/18 1216      Initial Review   Current issues with  None Identified      Family Dynamics   Good Support System?  Yes   Brandon Vasquez has his wife for support   Comments  Brandon Vasquez said he is having some marital problems      Barriers   Psychosocial barriers to participate in program  The patient should benefit from training in stress management and relaxation.      Screening Interventions   Interventions  Encouraged to exercise;To provide support and resources with identified psychosocial needs    Expected Outcomes  Long Term Goal: Stressors or current issues are controlled or eliminated.       Quality of Life  Scores: Quality of Life - 08/07/18 1109      Quality of Life   Select  Quality of Life      Quality of Life Scores   Health/Function Pre  22.6 %    Socioeconomic Pre  21.86 %    Psych/Spiritual Pre  24.21 %    Family Pre  20.4 %    GLOBAL Pre  22.46 %      Scores of 19 and below usually indicate a poorer quality of life in these areas.  A difference of  2-3 points is a clinically meaningful difference.  A difference of 2-3  points in the total score of the Quality of Life Index has been associated with significant improvement in overall quality of life, self-image, physical symptoms, and general health in studies assessing change in quality of life.  PHQ-9: Recent Review Flowsheet Data    There is no flowsheet data to display.     Interpretation of Total Score  Total Score Depression Severity:  1-4 = Minimal depression, 5-9 = Mild depression, 10-14 = Moderate depression, 15-19 = Moderately severe depression, 20-27 = Severe depression   Psychosocial Evaluation and Intervention:   Psychosocial Re-Evaluation:   Psychosocial Discharge (Final Psychosocial Re-Evaluation):   Vocational Rehabilitation: Provide vocational rehab assistance to qualifying candidates.   Vocational Rehab Evaluation & Intervention: Vocational Rehab - 08/07/18 1218      Initial Vocational Rehab Evaluation & Intervention   Assessment shows need for Vocational Rehabilitation  No   Brandon Vasquez is retired and does not need voational rehab at this time      Education: Education Goals: Education classes will be provided on a weekly basis, covering required topics. Participant will state understanding/return demonstration of topics presented.  Learning Barriers/Preferences: Learning Barriers/Preferences - 08/07/18 1122      Learning Barriers/Preferences   Learning Barriers  Sight    Learning Preferences  Individual Instruction;Video;Skilled Demonstration;Pictoral;Group Instruction       Education  Topics: Count Your Pulse:  -Group instruction provided by verbal instruction, demonstration, patient participation and written materials to support subject.  Instructors address importance of being able to find your pulse and how to count your pulse when at home without a heart monitor.  Patients get hands on experience counting their pulse with staff help and individually.   Heart Attack, Angina, and Risk Factor Modification:  -Group instruction provided by verbal instruction, video, and written materials to support subject.  Instructors address signs and symptoms of angina and heart attacks.    Also discuss risk factors for heart disease and how to make changes to improve heart health risk factors.   Functional Fitness:  -Group instruction provided by verbal instruction, demonstration, patient participation, and written materials to support subject.  Instructors address safety measures for doing things around the house.  Discuss how to get up and down off the floor, how to pick things up properly, how to safely get out of a chair without assistance, and balance training.   Meditation and Mindfulness:  -Group instruction provided by verbal instruction, patient participation, and written materials to support subject.  Instructor addresses importance of mindfulness and meditation practice to help reduce stress and improve awareness.  Instructor also leads participants through a meditation exercise.    Stretching for Flexibility and Mobility:  -Group instruction provided by verbal instruction, patient participation, and written materials to support subject.  Instructors lead participants through series of stretches that are designed to increase flexibility thus improving mobility.  These stretches are additional exercise for major muscle groups that are typically performed during regular warm up and cool down.   Hands Only CPR:  -Group verbal, video, and participation provides a basic overview of  AHA guidelines for community CPR. Role-play of emergencies allow participants the opportunity to practice calling for help and chest compression technique with discussion of AED use.   Hypertension: -Group verbal and written instruction that provides a basic overview of hypertension including the most recent diagnostic guidelines, risk factor reduction with self-care instructions and medication management.    Nutrition I class: Heart Healthy Eating:  -Group instruction provided by PowerPoint slides, verbal discussion,  and written materials to support subject matter. The instructor gives an explanation and review of the Therapeutic Lifestyle Changes diet recommendations, which includes a discussion on lipid goals, dietary fat, sodium, fiber, plant stanol/sterol esters, sugar, and the components of a well-balanced, healthy diet.   Nutrition II class: Lifestyle Skills:  -Group instruction provided by PowerPoint slides, verbal discussion, and written materials to support subject matter. The instructor gives an explanation and review of label reading, grocery shopping for heart health, heart healthy recipe modifications, and ways to make healthier choices when eating out.   Diabetes Question & Answer:  -Group instruction provided by PowerPoint slides, verbal discussion, and written materials to support subject matter. The instructor gives an explanation and review of diabetes co-morbidities, pre- and post-prandial blood glucose goals, pre-exercise blood glucose goals, signs, symptoms, and treatment of hypoglycemia and hyperglycemia, and foot care basics.   Diabetes Blitz:  -Group instruction provided by PowerPoint slides, verbal discussion, and written materials to support subject matter. The instructor gives an explanation and review of the physiology behind type 1 and type 2 diabetes, diabetes medications and rational behind using different medications, pre- and post-prandial blood glucose  recommendations and Hemoglobin A1c goals, diabetes diet, and exercise including blood glucose guidelines for exercising safely.    Portion Distortion:  -Group instruction provided by PowerPoint slides, verbal discussion, written materials, and food models to support subject matter. The instructor gives an explanation of serving size versus portion size, changes in portions sizes over the last 20 years, and what consists of a serving from each food group.   Stress Management:  -Group instruction provided by verbal instruction, video, and written materials to support subject matter.  Instructors review role of stress in heart disease and how to cope with stress positively.     Exercising on Your Own:  -Group instruction provided by verbal instruction, power point, and written materials to support subject.  Instructors discuss benefits of exercise, components of exercise, frequency and intensity of exercise, and end points for exercise.  Also discuss use of nitroglycerin and activating EMS.  Review options of places to exercise outside of rehab.  Review guidelines for sex with heart disease.   Cardiac Drugs I:  -Group instruction provided by verbal instruction and written materials to support subject.  Instructor reviews cardiac drug classes: antiplatelets, anticoagulants, beta blockers, and statins.  Instructor discusses reasons, side effects, and lifestyle considerations for each drug class.   Cardiac Drugs II:  -Group instruction provided by verbal instruction and written materials to support subject.  Instructor reviews cardiac drug classes: angiotensin converting enzyme inhibitors (ACE-I), angiotensin II receptor blockers (ARBs), nitrates, and calcium channel blockers.  Instructor discusses reasons, side effects, and lifestyle considerations for each drug class.   Anatomy and Physiology of the Circulatory System:  Group verbal and written instruction and models provide basic cardiac anatomy  and physiology, with the coronary electrical and arterial systems. Review of: AMI, Angina, Valve disease, Heart Failure, Peripheral Artery Disease, Cardiac Arrhythmia, Pacemakers, and the ICD.   Other Education:  -Group or individual verbal, written, or video instructions that support the educational goals of the cardiac rehab program.   Holiday Eating Survival Tips:  -Group instruction provided by PowerPoint slides, verbal discussion, and written materials to support subject matter. The instructor gives patients tips, tricks, and techniques to help them not only survive but enjoy the holidays despite the onslaught of food that accompanies the holidays.   Knowledge Questionnaire Score: Knowledge Questionnaire Score - 08/07/18 1106  Knowledge Questionnaire Score   Pre Score  20/24       Core Components/Risk Factors/Patient Goals at Admission: Personal Goals and Risk Factors at Admission - 08/07/18 1218      Core Components/Risk Factors/Patient Goals on Admission    Weight Management  Yes;Weight Maintenance;Weight Loss    Intervention  Weight Management: Develop a combined nutrition and exercise program designed to reach desired caloric intake, while maintaining appropriate intake of nutrient and fiber, sodium and fats, and appropriate energy expenditure required for the weight goal.;Weight Management: Provide education and appropriate resources to help participant work on and attain dietary goals.;Weight Management/Obesity: Establish reasonable short term and long term weight goals.    Admit Weight  197 lb 8.5 oz (89.6 kg)    Expected Outcomes  Short Term: Continue to assess and modify interventions until short term weight is achieved;Long Term: Adherence to nutrition and physical activity/exercise program aimed toward attainment of established weight goal;Weight Maintenance: Understanding of the daily nutrition guidelines, which includes 25-35% calories from fat, 7% or less cal from  saturated fats, less than 259m cholesterol, less than 1.5gm of sodium, & 5 or more servings of fruits and vegetables daily;Weight Loss: Understanding of general recommendations for a balanced deficit meal plan, which promotes 1-2 lb weight loss per week and includes a negative energy balance of [id number] kcal/d;Understanding recommendations for meals to include 15-35% energy as protein, 25-35% energy from fat, 35-60% energy from carbohydrates, less than 2038mof dietary cholesterol, 20-35 gm of total fiber daily;Understanding of distribution of calorie intake throughout the day with the consumption of 4-5 meals/snacks    Diabetes  Yes    Intervention  Provide education about signs/symptoms and action to take for hypo/hyperglycemia.;Provide education about proper nutrition, including hydration, and aerobic/resistive exercise prescription along with prescribed medications to achieve blood glucose in normal ranges: Fasting glucose 65-99 mg/dL    Expected Outcomes  Short Term: Participant verbalizes understanding of the signs/symptoms and immediate care of hyper/hypoglycemia, proper foot care and importance of medication, aerobic/resistive exercise and nutrition plan for blood glucose control.;Long Term: Attainment of HbA1C < 7%.    Hypertension  Yes    Intervention  Provide education on lifestyle modifcations including regular physical activity/exercise, weight management, moderate sodium restriction and increased consumption of fresh fruit, vegetables, and low fat dairy, alcohol moderation, and smoking cessation.;Monitor prescription use compliance.    Expected Outcomes  Short Term: Continued assessment and intervention until BP is < 140/9073mG in hypertensive participants. < 130/81m49m in hypertensive participants with diabetes, heart failure or chronic kidney disease.;Long Term: Maintenance of blood pressure at goal levels.    Lipids  Yes    Intervention  Provide education and support for participant on  nutrition & aerobic/resistive exercise along with prescribed medications to achieve LDL <70mg66mL >40mg.75mExpected Outcomes  Short Term: Participant states understanding of desired cholesterol values and is compliant with medications prescribed. Participant is following exercise prescription and nutrition guidelines.;Long Term: Cholesterol controlled with medications as prescribed, with individualized exercise RX and with personalized nutrition plan. Value goals: LDL < 70mg, 33m> 40 mg.    Stress  Yes    Intervention  Offer individual and/or small group education and counseling on adjustment to heart disease, stress management and health-related lifestyle change. Teach and support self-help strategies.;Refer participants experiencing significant psychosocial distress to appropriate mental health specialists for further evaluation and treatment. When possible, include family members and significant others in education/counseling sessions.    Expected  Outcomes  Short Term: Participant demonstrates changes in health-related behavior, relaxation and other stress management skills, ability to obtain effective social support, and compliance with psychotropic medications if prescribed.;Long Term: Emotional wellbeing is indicated by absence of clinically significant psychosocial distress or social isolation.       Core Components/Risk Factors/Patient Goals Review:    Core Components/Risk Factors/Patient Goals at Discharge (Final Review):    ITP Comments: ITP Comments    Row Name 08/06/18 1608           ITP Comments  Dr.Traci Radford Pax, Medical Director           Comments: Brandon Vasquez attended orientation from [phone] to 1100 to review rules and guidelines for program. Completed 6 minute walk test, Intitial ITP, and exercise prescription.  VSS. Telemetry-Sinus Rhythm with a rare PAC.  Asymptomatic.Genoveva Ill did not have any chest burning during his walk test and walked at a slow pace. Brandon Vasquez has an  appointment to see Loma Sousa PA tomorrow morning at 1000. Brandon Vasquez was given a copy of his ECG tracings and walk test information for Hal to review tomorrow. [name] left cardiac rehab without complaints.Barnet Pall, RN,BSN 08/07/2018 12:28 PM

## 2018-08-07 NOTE — Progress Notes (Signed)
Brandon Vasquez 72 y.o. male DOB: June 02, 1946 MRN: [id number]      Nutrition Note  1. 05/09/2018 Stented coronary artery    Past Medical History:  Diagnosis Date  . Coronary artery disease involving native heart with angina pectoris [facility]) 2005   a. 2005: CABG x5V in [place]  b. 04/2016: PCI/DES to SVG--> RCA. Severe Ost LCx (with CTO of grafted OMs - no retrograde filling) - distal Cx unprotected; RCA & LAD essentially CTO.;; 05/2018 - SVG-RCA ostial ISR with post-stent 80% - Overlapping DES & high Atm post-dilation of ISR. Progression of Native LM-ostLCx disease  . Essential hypertension   . HCV (hepatitis C virus)    "treated" (05/08/2018)  . HLD (hyperlipidemia)   . Hx of CABG 2001   [place] - CABG 4: LIMA-LAD, SeqSVG-OM1-OM2,SVG-rPDA  . MI (myocardial infarction) ([place]) 2001  . Non-ST elevation MI (NSTEMI) ([place]) 04/2016   Culprit lesion was 90% SVG-RPDA -> DES PCI. Also noted 80% ostial native circumflex with distal circumflex potentially in jeopardy -> MEDICAL Management.  . Type 2 diabetes mellitus with complication ([place])    CAD   Meds reviewed.   Current Outpatient Medications (Endocrine & Metabolic):  .  metFORMIN (GLUCOPHAGE-XR) 500 MG 24 hr tablet, Take 500 mg by mouth daily.   Current Outpatient Medications (Cardiovascular):  .  amLODipine (NORVASC) 10 MG tablet, Take 1 tablet (10 mg total) by mouth daily. Marland Kitchen  ezetimibe (ZETIA) 10 MG tablet, Take 1 tablet (10 mg total) by mouth daily. .  isosorbide mononitrate (IMDUR) 60 MG 24 hr tablet, Take 1 tablet (60 mg total) by mouth daily. .  metoprolol succinate (TOPROL XL) 25 MG 24 hr tablet, Take 1 tablet (25 mg total) by mouth daily. .  nitroGLYCERIN (NITROSTAT) 0.4 MG SL tablet, Place 1 tablet (0.4 mg total) under the tongue every 5 (five) minutes x 3 doses as needed for chest pain. (Patient not taking: Reported on 08/04/2018) .  rosuvastatin (CRESTOR) 40 MG tablet, Take 1 tablet (40 mg total) by mouth daily.   Current Outpatient  Medications (Analgesics):  .  aspirin 81 MG EC tablet, Take 81 mg by mouth daily. Swallow whole. Marland Kitchen  HYDROcodone-acetaminophen (NORCO) 5-325 MG tablet, Take 1 tablet by mouth every 6 (six) hours as needed for moderate pain. (Patient not taking: Reported on 08/04/2018)  Current Outpatient Medications (Hematological):  .  ticagrelor (BRILINTA) 90 MG TABS tablet, Take 1 tablet (90 mg total) by mouth 2 (two) times daily.  Current Outpatient Medications (Other):  Marland Kitchen  Hypromellose (ARTIFICIAL TEARS OP), Apply 1-2 drops to eye daily as needed (dry eyes).   HT: Ht Readings from Last 1 Encounters:  08/07/18 5' 11.5" (1.816 m)    WT: Wt Readings from Last 5 Encounters:  08/07/18 197 lb 8.5 oz (89.6 kg)  07/15/18 190 lb (86.2 kg)  06/16/18 190 lb 9.6 oz (86.5 kg)  05/27/18 188 lb (85.3 kg)  05/09/18 189 lb 2.5 oz (85.8 kg)     Body mass index is 27.17 kg/m.    Current tobacco use? No  Labs:  Lipid Panel     Component Value Date/Time   CHOL 153 04/29/2018 1513   TRIG 70 04/29/2018 1513   HDL 61 04/29/2018 1513   CHOLHDL 2.5 04/29/2018 1513   CHOLHDL 3.6 11/22/2017 1030   VLDL 16 12/12/2016 1000   LDLCALC 78 04/29/2018 1513   LDLCALC 115 (H) 11/22/2017 1030    No results found for: HGBA1C CBG (last 3)  No results for  input(s): GLUCAP in the last 72 hours.  Nutrition Note Spoke with pt. Nutrition plan and goals reviewed with pt. Pt is following Step 2 of the Therapeutic Lifestyle Changes diet. Pt shared he is happy with his weight currently. Heart healthy diabetic tips reviewed (label reading, how to build a healthy plate, portion sizes, eating frequently across the day). Pt has Type 2 Diabetes. Last A1c indicates blood glucose well-controlled. Pt shared that he does not check his CBG's regularly, he is unsure of his average fasting CBG's. Reviewed the importance of regularly checking CBG's with patient. Recommended pt check CBG's before and after meals and keeping a log. Pt with recent  DES. Per discussion, pt does not use canned/convenience foods often. Pt sometimes adds salt to food. Pt does not eat out frequently. Pt expressed understanding of the information reviewed. Pt aware of nutrition education classes offered and would like to attend nutrition classes.  Nutrition Diagnosis ? Food-and nutrition-related knowledge deficit related to lack of exposure to information as related to diagnosis of: ? CVD ? Type 2 Diabetes  Nutrition Intervention ? Pt's individual nutrition plan and goals reviewed with pt  Nutrition Goal(s):   ? Pt to identify and limit food sources of saturated fat, trans fat, refined carbohydrates and sodium ? Pt to describe the benefit of including fruits, vegetables, whole grains, and low-fat dairy products in a heart healthy meal plan. ? Pt able to name foods that affect blood glucose.   Plan:  ? Pt to attend nutrition classes ? Nutrition I ? Nutrition II ? Portion Distortion  ? Diabetes Blitz ? Diabetes Q & Ae determined ? Will provide client-centered nutrition education as part of interdisciplinary care ? Monitor and evaluate progress toward nutrition goal with team.   Laurina Bustle, MS, RD, LDN 08/07/2018 10:57 AM

## 2018-08-07 NOTE — Progress Notes (Signed)
Brandon Vasquez is here for orientation this morning for cardiac rehab. Brandon Vasquez reports that experiences burning in his chest whenever he does any type of exertional activity or gets excited. Brandon Vasquez parked his car this morning and said that experienced some burning in his chest walking from the parking lot. Brandon Vasquez is currently pain free. Telemetry rhythm Sinus rate 66. Blood pressure 118/78. Oxygen saturation 99% on room air. Vin Bhagat PA called and notified. Vin said to proceed with his walk test this morning and get a 12 lead ECG if he experiences any chest burning. Vin talked with Mr Valeriano over the phone about his symptoms. Vin also said to make an appointment for Brandon Vasquez to be evaluated in the office. Appointment made for Mr Pilson to see Loma Sousa PA tomorrow morning at 1000. Patient aware of the appointment.Barnet Pall, RN,BSN 08/07/2018 9:40 AM

## 2018-08-08 ENCOUNTER — Ambulatory Visit (HOSPITAL_COMMUNITY): Payer: Medicare Other

## 2018-08-08 ENCOUNTER — Ambulatory Visit (INDEPENDENT_AMBULATORY_CARE_PROVIDER_SITE_OTHER): Payer: Medicare Other | Admitting: Physician Assistant

## 2018-08-08 ENCOUNTER — Encounter: Payer: Self-pay | Admitting: Physician Assistant

## 2018-08-08 VITALS — BP 122/68 | HR 60

## 2018-08-08 DIAGNOSIS — E785 Hyperlipidemia, unspecified: Secondary | ICD-10-CM

## 2018-08-08 DIAGNOSIS — R079 Chest pain, unspecified: Secondary | ICD-10-CM | POA: Diagnosis not present

## 2018-08-08 DIAGNOSIS — I209 Angina pectoris, unspecified: Secondary | ICD-10-CM

## 2018-08-08 DIAGNOSIS — I25708 Atherosclerosis of coronary artery bypass graft(s), unspecified, with other forms of angina pectoris: Secondary | ICD-10-CM | POA: Diagnosis not present

## 2018-08-08 DIAGNOSIS — E119 Type 2 diabetes mellitus without complications: Secondary | ICD-10-CM | POA: Diagnosis not present

## 2018-08-08 MED ORDER — ISOSORBIDE MONONITRATE ER 60 MG PO TB24: 90.0000 mg | ORAL_TABLET | Freq: Every day | ORAL | 1 refills | Status: DC

## 2018-08-08 MED ORDER — RANOLAZINE ER 500 MG PO TB12: 500.0000 mg | ORAL_TABLET | Freq: Two times a day (BID) | ORAL | 3 refills | Status: DC

## 2018-08-08 NOTE — Progress Notes (Signed)
Cardiology Office Note    Date:  08/10/2018   ID:  Brandon Vasquez, DOB 1946-01-09, MRN [id number]  PCP:  Patient, No Pcp Per  Cardiologist:  Dr. Ellyn Hack   Chief Complaint  Patient presents with  . Follow-up    seen for Dr. Ellyn Hack.     History of Present Illness:  Brandon Vasquez is a 72 y.o. male with PMH of CAD s/p CABG,hyperlipidemia, DM 2 and history of hepatitis C. Patient had a CABG x4 with LIMA to LAD, SVG to RPDA, sequential SVG to OM1 and OM 2 in 2001 in [place]. He presented with NSTEMIin August 2017 and underwent PCI to SVG to RCA, medical therapy for native left circumflex disease. Myoview in November 2018 showed EF 44%, medium size moderate severity fixed defect in the basal inferolateral, mid inferolateral wall consistent with prior MI with mild peri-infarct ischemia, overall considered low risk. He was previously on lisinopril, this has been switched to amlodipine plus HCTZ to avoid hypotensionand dizziness. During his last office visit with Dr. Laurence Slate 12/24/2017, his amlodipine was increased to 10 mg and Toprol to 62mdailytablet. His Pravachol was stopped and he was started on Crestor 40 mg daily.He was also switched from Brilinta to Plavix.  Patient underwent another cardiac catheterization on 05/08/2018 which showed 75% proximal SVG-RPDA- PL lesion treated with 3.5 x 12 mm DES.  He also had progression of proximal RCA, distal left main, ostial left circumflex disease.  Otherwise patent LIMA to LAD, patent sequential SVG to OM1 and OM 2.  Postprocedure, he was restarted on Brilinta 90 mg twice daily.  Patient was last seen by Dr. HEllyn Hackon 06/16/2018, he still has occasional burning sensation which Dr. HEllyn Hackcategorized as either class I may be class II symptoms.  The symptoms seems to be quite stable.  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  Patient presents today along with his wife.  He still occasionally noticed some chest burning sensation.  Given the recent cardiac catheterization result, I recommend up titration of antianginal medication.  I will increase the Imdur to 90 mg daily and add Ranexa 500 mg twice daily.  Otherwise he appears to be euvolemic on physical exam.     Past Medical History:  Diagnosis Date  . Coronary artery disease involving native heart with angina pectoris ([facility] 2005   a. [place] b. 04/2016: PCI/DES to SVG--> RCA. Severe Ost LCx (with CTO of grafted OMs - no retrograde filling) - distal Cx unprotected; RCA & LAD essentially CTO.;; 05/2018 - SVG-RCA ostial ISR with post-stent 80% - Overlapping DES & high Atm post-dilation of ISR. Progression of Native LM-ostLCx disease  . Essential hypertension   . HCV (hepatitis C virus)    "treated" (05/08/2018)  . HLD (hyperlipidemia)   . Hx of CABG 2001   [place] - CABG 4: LIMA-LAD, SeqSVG-OM1-OM2,SVG-rPDA  . [place]  . Non-ST elevation [place] ([place]I) [place] 04[place]   Culprit lesion was 90% SVG-RPDA -> DES PCI. Also noted 80% ostial native circumflex with distal circumflex potentially in jeopardy -> MEDICAL Management.  . Type 2 diabetes mellitus with complication (HCC)    CAD    Past Surgical History:  Procedure Laterality Date  . CARDIAC CATHETERIZATION N/A 04/23/2016  Procedure: Left Heart Cath and Cors/Grafts Angiography;  Surgeon: Jettie Booze, MD;  Location: [facility];  Service: Cardiovascular: ostLAD 75%->pLAD 100%, ostOM1 & OM2 100%, pRCA 80% -> dRCA 50%; LIMA-LAD patent, SVG-OM1-OM2 ~20% prox otw patentt, ost-pSVG-rPDA 90% --> PCI  . CARDIAC CATHETERIZATION N/A 04/23/2016   Procedure: Coronary Stent Intervention;  Surgeon: Jettie Booze, MD;  Location: [facility];  Service: Cardiovascular;  Laterality: N/A;  DES PCI - 90%  pSVG- PDA (Synergy DES 3.5 x 20)  . CORONARY ARTERY BYPASS GRAFT  2001   LIMA-LAD, SeqSVG-OM1-OM2,SVG-rPDA  . CORONARY STENT INTERVENTION N/A 05/08/2018   Procedure: CORONARY STENT INTERVENTION;  Surgeon: Leonie Man, MD;  Location: [facility];  Service: Cardiovascular;; Ost SVG-RPDA-PL 70% ISR & 75-80% just beyond stent --> Synergy DES 3.5 x 12 (overlapping) with 4.0 mm post-dilation of overlap & entire old stent (especially the [place])  . LAPAROSCOPIC CHOLECYSTECTOMY    . LEFT HEART CATH AND CORS/GRAFTS ANGIOGRAPHY N/A 05/08/2018   Procedure: LEFT HEART CATH AND CORS/GRAFTS ANGIOGRAPHY;  Surgeon: Leonie Man, MD;  Location: [place] [facility];  Service: Cardiovascular;; SVG-RPDA-PL 70% ostial ISR & 75% aftter stent; Progression of dLM-OstLCx to ~90-95%.-> PCI of SVG-RPDA-PL. RCA & LAD CTO.  Patent SeqSVG-OM1-OM2 (native OMs ostial CTO - no retrograde flow), Patent LIMA-mLAD  . NM MYOVIEW LTD  07/2017   EF 44%.  Medium-sized, moderate severity fixed defect in basal inferolateral and mid inferolateral wall consistent with prior infarct and mild peri-infarct ischemia.  LOW RISK.  Marland Kitchen TRANSTHORACIC ECHOCARDIOGRAM  04/2016   EF 60-65%. GR 2 DD. Inferior hypokinesis. No significant valvular lesions    Current Medications: Outpatient Medications Prior to Visit  Medication Sig Dispense Refill  . amLODipine (NORVASC) 10 MG tablet Take 1 tablet (10 mg total) by mouth daily. 90 tablet 4  . aspirin 81 MG EC tablet Take 81 mg by mouth daily. Swallow whole.    . ezetimibe (ZETIA) 10 MG tablet Take 1 tablet (10 mg total) by mouth daily. 30 tablet 6  . Hypromellose (ARTIFICIAL TEARS OP) Apply 1-2 drops to eye daily as needed (dry eyes).    . metFORMIN (GLUCOPHAGE-XR) 500 MG 24 hr tablet Take 500 mg by mouth daily.     . metoprolol succinate (TOPROL XL) 25 MG 24 hr tablet Take 1 tablet (25 mg total) by mouth daily. 60 tablet 6  . nitroGLYCERIN (NITROSTAT) 0.4 MG SL tablet Place 1 tablet (0.4 mg  total) under the tongue every 5 (five) minutes x 3 doses as needed for chest pain. 25 tablet 6  . rosuvastatin (CRESTOR) 40 MG tablet Take 1 tablet (40 mg total) by mouth daily. 90 tablet 3  . ticagrelor (BRILINTA) 90 MG TABS tablet Take 1 tablet (90 mg total) by mouth 2 (two) times daily. 60 tablet 5  . isosorbide mononitrate (IMDUR) 60 MG 24 hr tablet Take 1 tablet (60 mg total) by mouth daily. 90 tablet 3  . HYDROcodone-acetaminophen (NORCO) 5-325 MG tablet Take 1 tablet by mouth every 6 (six) hours as needed for moderate pain. (Patient not taking: Reported on 08/04/2018) 10 tablet 0   No facility-administered medications prior to visit.      Allergies:   Patient has no known allergies.   Social History   Socioeconomic History  . Marital status: Married    Spouse name: Not on file  . Number of children: Not on file  . Years of education: Not on file  .  Highest education level: Not on file  Occupational History  . Occupation: Retired  Scientific laboratory technician  . Financial resource strain: Not on file  . Food insecurity:    Worry: Never true    Inability: Never true  . Transportation needs:    Medical: No    Non-medical: No  Tobacco Use  . Smoking status: Former Smoker    Packs/day: 1.00    Years: 30.00    Pack years: 30.00    Types: Cigarettes    Last attempt to quit: 07/10/1996    Years since quitting: 22.0  . Smokeless tobacco: Never Used  Substance and Sexual Activity  . Alcohol use: Yes    Comment: 05/08/2018 "couple drinks/yyear"  . Drug use: Not Currently    Types: Marijuana    Comment: 05/08/2018 "nothing in 2019"  . Sexual activity: Not Currently  Lifestyle  . Physical activity:    Days per week: 0 days    Minutes per session: 0 min  . Stress: To some extent  Relationships  . Social connections:    Talks on phone: Not on file    Gets together: Not on file    Attends religious service: Not on file    Active member of club or organization: Not on file    Attends meetings  of clubs or organizations: Not on file    Relationship status: Not on file  Other Topics Concern  . Not on file  Social History Narrative  . Not on file     Family History:  The patient's family history includes Alzheimer's disease in his father; Cerebral aneurysm in his mother; Hypertension in his mother.   ROS:   Please see the history of present illness.    ROS All other systems reviewed and are negative.   PHYSICAL EXAM:   VS:  BP 122/68   Pulse 60   SpO2 99%    GEN: Well nourished, well developed, in no acute distress  HEENT: normal  Neck: no JVD, carotid bruits, or masses Cardiac: RRR; no murmurs, rubs, or gallops,no edema  Respiratory:  clear to auscultation bilaterally, normal work of breathing GI: soft, nontender, nondistended, + BS MS: no deformity or atrophy  Skin: warm and dry, no rash Neuro:  Alert and Oriented x 3, Strength and sensation are intact Psych: euthymic mood, full affect  Wt Readings from Last 3 Encounters:  08/07/18 197 lb 8.5 oz (89.6 kg)  07/15/18 190 lb (86.2 kg)  06/16/18 190 lb 9.6 oz (86.5 kg)      Studies/Labs Reviewed:   EKG:  EKG is not ordered today.  \  Recent Labs: 03/25/2018: TSH 1.450 04/29/2018: ALT 24 05/09/2018: BUN 18; Creatinine, Ser 1.23; Hemoglobin 11.2; Platelets 178; Potassium 3.9; Sodium 141   Lipid Panel    Component Value Date/Time   CHOL 153 04/29/2018 1513   TRIG 70 04/29/2018 1513   HDL 61 04/29/2018 1513   CHOLHDL 2.5 04/29/2018 1513   CHOLHDL 3.6 11/22/2017 1030   VLDL 16 12/12/2016 1000   LDLCALC 78 04/29/2018 1513   LDLCALC 115 (H) 11/22/2017 1030    Additional studies/ records that were reviewed today include:   Cath 05/08/2018  Prox RCA lesion is 90% stenosed. Dist RCA lesion is 50% stenosed.  Seq SVG- RPDA-RPL graft was visualized by angiography and is large. -- CULPRIT LESIONS involve Ostial-Proximal Stent ISR & post stent lesion.  SVG-RPDA-PL: Prox Graft lesion is 75% stenosed just after  stent  A drug-eluting stent was successfully  placed using a STENT SYNERGY DES 3.5X12.  Post intervention, there is a 0% residual stenosis.  SVG-RPDA-PL: Origin to Prox Graft Stent is 70% stenosed.  Post intervention, there is a 0% residual stenosis.  Balloon angioplasty was performed using a BALLOON SAPPHIRE [place] 4.0X8.  =-=-=-=-=-=-=-=-=-  Mid LM to Ost LAD lesion is 95% stenosed.  Ost LAD to Prox LAD lesion is 75% stenosed. Prox LAD lesion is 100% stenosed.  Ost Cx lesion is 90% stenosed. Ost 1st Mrg lesion is 100% stenosed. Ost 2nd Mrg to 2nd Mrg lesion is 100% stenosed.  SVG-OM1-OM2 graft was visualized by angiography and is large. Origin to Prox Graft lesion before 1st Mrg is 20% stenosed.  LIMA graft was visualized by angiography and is normal in caliber.  =-=-=-=-=-=-=-=-=-  The left ventricular systolic function is normal. The left ventricular ejection fraction is 55-65% by visual estimate.  LV end diastolic pressure is normal.    In-stent restenosis followed by post-stent severe stenosis in the SVG-RPDA-RPL -> successful PTCA of in-stent restenosis with a second Synergy DES (3.5 mm x 12 mm) placed to cover the distal edge lesion.  Progression of proximal RCA, distal Left Main and ostial Circumflex disease.  Otherwise patent LIMA-LAD and SVG-OM1-OM 2.  Plan: The patient will be monitored overnight and discharged tomorrow morning having restarted Brilinta at 90 mg twice daily. Hold metformin, use sliding scale insulin Anticipate discharge tomorrow and follow-up with either myself or Almyra Deforest, PA  Recommend uninterrupted dual antiplatelet therapy with Aspirin 56m daily and Ticagrelor 954mtwice daily for a minimum of 12 months (ACS - Class I recommendation). -Given the fact that he denies stent overlapping in the vein graft with difficult to expand stent, we will continue on Brilinta at 60 mg after 6 months.  Can stop aspirin after 3-6 months. Would be okay to hold  Brilinta 6 months post cath.    Myoview 07/15/2018 Study Highlights     The left ventricular ejection fraction is mildly decreased (45-54%).  Nuclear stress EF: 48%.  There was no ST segment deviation noted during stress.  Findings consistent with prior myocardial infarction.  This is a low risk study.   Study appears to have low counts Small inferior lateral wall infarct from apex to base and septal thinning at base No ischemia EF estimated at 48% with abnormal septal motion    ASSESSMENT:    1. Chest pain, unspecified type   2. Controlled type 2 diabetes mellitus without complication, without long-term current use of insulin ([facility]  3. Coronary artery disease of bypass graft of native heart with stable angina pectoris ([facility]  4. Hyperlipidemia, unspecified hyperlipidemia type      PLAN:  In order of problems listed above:  1. Chest pain: Continue up titration of antianginal medication.  Increase Imdur to 90 mg daily and start on Ranexa 500 mg twice daily.  I reviewed the recent cardiac catheterization result, it is unclear to me whether or not his continued exertional anginal symptom is related to decreased perfusion to the left circumflex artery.   2. CAD: Continue aspirin and statin  3. Hyperlipidemia: LDL goal less than 70, continue Crestor 40 mg daily  4. DM2: Managed by primary care provider.    Medication Adjustments/Labs and Tests Ordered: Current medicines are reviewed at length with the patient today.  Concerns regarding medicines are outlined above.  Medication changes, Labs and Tests ordered today are listed in the Patient Instructions below. Patient Instructions  Medication Instructions:  START Ranexa 500 mg twice daily  Increase Isosorbide to 90 mg (1 & 1/2 tab) daily.  If you need a refill on your cardiac medications before your next appointment, please call your pharmacy.   Follow-Up: At [facility] you and your health needs are our  priority.  As part of our continuing mission to provide you with exceptional heart care, we have created designated Provider Care Teams.  These Care Teams include your primary Cardiologist (physician) and Advanced Practice Providers (APPs -  Physician Assistants and Nurse Practitioners) who all work together to provide you with the care you need, when you need it. You will need a follow up appointment in 2-3 months with Dr. Ellyn Hack or one of the following Advanced Practice Providers on your designated Care Team:   Rosaria Ferries, PA-C . Jory Sims, DNP, ANP  Any Other Special Instructions Will Be Listed Below (If Applicable). None      Hilbert Corrigan, [place]  08/10/2018 12:33 AM    [facility] [address], [place], [place]  [address] Phone: [phone]; Fax: [phone]

## 2018-08-08 NOTE — Patient Instructions (Signed)
Medication Instructions:  START Ranexa 500 mg twice daily  Increase Isosorbide to 90 mg (1 & 1/2 tab) daily.  If you need a refill on your cardiac medications before your next appointment, please call your pharmacy.   Follow-Up: At [facility], you and your health needs are our priority.  As part of our continuing mission to provide you with exceptional heart care, we have created designated Provider Care Teams.  These Care Teams include your primary Cardiologist (physician) and Advanced Practice Providers (APPs -  Physician Assistants and Nurse Practitioners) who all work together to provide you with the care you need, when you need it. You will need a follow up appointment in 2-3 months with Dr. Ellyn Hack or one of the following Advanced Practice Providers on your designated Care Team:   Rosaria Ferries, PA-C . Jory Sims, DNP, ANP  Any Other Special Instructions Will Be Listed Below (If Applicable). None

## 2018-08-10 ENCOUNTER — Encounter: Payer: Self-pay | Admitting: Physician Assistant

## 2018-08-11 ENCOUNTER — Encounter (HOSPITAL_COMMUNITY)
Admission: RE | Admit: 2018-08-11 | Discharge: 2018-08-11 | Disposition: A | Discharge status: Home / Self Care | Payer: Medicare Other | Source: Ambulatory Visit | Attending: Cardiology | Admitting: Cardiology

## 2018-08-11 ENCOUNTER — Ambulatory Visit (HOSPITAL_COMMUNITY): Payer: Medicare Other

## 2018-08-11 ENCOUNTER — Ambulatory Visit: Payer: Medicare Other | Admitting: Cardiology

## 2018-08-11 ENCOUNTER — Telehealth: Payer: Self-pay | Admitting: Cardiology

## 2018-08-11 DIAGNOSIS — E119 Type 2 diabetes mellitus without complications: Secondary | ICD-10-CM | POA: Diagnosis not present

## 2018-08-11 DIAGNOSIS — I10 Essential (primary) hypertension: Secondary | ICD-10-CM | POA: Diagnosis not present

## 2018-08-11 DIAGNOSIS — Z955 Presence of coronary angioplasty implant and graft: Secondary | ICD-10-CM

## 2018-08-11 DIAGNOSIS — I214 Non-ST elevation (NSTEMI) myocardial infarction: Secondary | ICD-10-CM | POA: Diagnosis not present

## 2018-08-11 DIAGNOSIS — E785 Hyperlipidemia, unspecified: Secondary | ICD-10-CM | POA: Diagnosis not present

## 2018-08-11 DIAGNOSIS — I25119 Atherosclerotic heart disease of native coronary artery with unspecified angina pectoris: Secondary | ICD-10-CM | POA: Diagnosis not present

## 2018-08-11 LAB — GLUCOSE, CAPILLARY: Glucose-Capillary: 85 mg/dL (ref 70–99)

## 2018-08-11 NOTE — Telephone Encounter (Signed)
SPOKE TO Brandon Vasquez-  PER Brandon Vasquez  DISREGARD MESSAGE -  SHE SPOKE TO PATIENT ABOUT TAKING MEDICATION - PATIENT WAS NOT ABLE TO DO REHAB TODAY DUE TO GLUCOSE LEVEL

## 2018-08-11 NOTE — Telephone Encounter (Signed)
° ° °  Brandon Vasquez calling with questions about isosorbide mononitrate (IMDUR) 60 MG 24 hr tablet

## 2018-08-11 NOTE — Telephone Encounter (Signed)
Called Maria at cardiac rehab, spoke with DOD Dr.Christopher who states that it would be okay for him to do cardiac rehab, if he begins having CP to stop and let us know.  [name] verbalized understanding.

## 2018-08-11 NOTE — Progress Notes (Signed)
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  CBG 85. No exercise per protocol. Upon review of Mr Helsley's medication. [name] is only taking 60 mg of Isosorbide instead of the the 90 mg's that was prescribed to him by Loma Sousa PA on Friday. [name] is taking the Ranexa twice a day as prescribed. [name] reports that he still has a burning in chest when he over exert's himself. Dr Hardin's RN Ivin Booty notified. I spoke with [name]'s wife over the phone. [name] is going to pick up his prescription for 90 mg of Imdur later on today. Patient was given peanut butter, [place] crackers and lemonade. [name] had eaten breakfast but had not eaten lunch today. Patient was reminded to make sure he eats lunch with a protein and a carbohydrate before he comes to exercise on Wednesday. Patient states understanding.Barnet Pall, RN,BSN 08/11/2018 4:45 PM

## 2018-08-11 NOTE — Telephone Encounter (Signed)
° ° °  Brandon Vasquez from [facility] calling to verify if patient can participate in rehab today; 2:45. Patient experienced chest pain last week.

## 2018-08-13 ENCOUNTER — Ambulatory Visit (HOSPITAL_COMMUNITY): Payer: Medicare Other

## 2018-08-13 ENCOUNTER — Encounter (HOSPITAL_COMMUNITY)
Admission: RE | Admit: 2018-08-13 | Discharge: 2018-08-13 | Disposition: A | Discharge status: Home / Self Care | Payer: Medicare Other | Source: Ambulatory Visit | Attending: Cardiology | Admitting: Cardiology

## 2018-08-13 DIAGNOSIS — Z955 Presence of coronary angioplasty implant and graft: Secondary | ICD-10-CM

## 2018-08-13 DIAGNOSIS — I214 Non-ST elevation (NSTEMI) myocardial infarction: Secondary | ICD-10-CM | POA: Diagnosis not present

## 2018-08-13 DIAGNOSIS — E785 Hyperlipidemia, unspecified: Secondary | ICD-10-CM | POA: Diagnosis not present

## 2018-08-13 DIAGNOSIS — E119 Type 2 diabetes mellitus without complications: Secondary | ICD-10-CM | POA: Diagnosis not present

## 2018-08-13 DIAGNOSIS — I10 Essential (primary) hypertension: Secondary | ICD-10-CM | POA: Diagnosis not present

## 2018-08-13 DIAGNOSIS — I25119 Atherosclerotic heart disease of native coronary artery with unspecified angina pectoris: Secondary | ICD-10-CM | POA: Diagnosis not present

## 2018-08-13 LAB — GLUCOSE, CAPILLARY
GLUCOSE-CAPILLARY: 113 mg/dL — AB (ref 70–99)
Glucose-Capillary: 133 mg/dL — ABNORMAL HIGH (ref 70–99)

## 2018-08-13 NOTE — Progress Notes (Signed)
Cardiac Individual Treatment Plan  Patient Details  Name: Brandon Vasquez MRN: [id number] Date of Birth: 1946-02-18 Referring Provider:     [facility] from 08/07/2018 in [place]  Referring Provider  Dr. Ellyn Hack      Initial Encounter Date:    CARDIAC REHAB PHASE II ORIENTATION from 08/07/2018 in [place]  Date  08/07/18      Visit Diagnosis: 05/09/2018 Stented coronary artery S/P DES RPDA Graft  Patient's Home Medications on Admission:  Current Outpatient Medications:  .  amLODipine (NORVASC) 10 MG tablet, Take 1 tablet (10 mg total) by mouth daily., Disp: 90 tablet, Rfl: 4 .  aspirin 81 MG EC tablet, Take 81 mg by mouth daily. Swallow whole., Disp: , Rfl:  .  ezetimibe (ZETIA) 10 MG tablet, Take 1 tablet (10 mg total) by mouth daily., Disp: 30 tablet, Rfl: 6 .  HYDROcodone-acetaminophen (NORCO) 5-325 MG tablet, Take 1 tablet by mouth every 6 (six) hours as needed for moderate pain. (Patient not taking: Reported on 08/04/2018), Disp: 10 tablet, Rfl: 0 .  Hypromellose (ARTIFICIAL TEARS OP), Apply 1-2 drops to eye daily as needed (dry eyes)., Disp: , Rfl:  .  isosorbide mononitrate (IMDUR) 60 MG 24 hr tablet, Take 1.5 tablets (90 mg total) by mouth daily., Disp: 135 tablet, Rfl: 1 .  metFORMIN (GLUCOPHAGE-XR) 500 MG 24 hr tablet, Take 500 mg by mouth daily. , Disp: , Rfl:  .  metoprolol succinate (TOPROL XL) 25 MG 24 hr tablet, Take 1 tablet (25 mg total) by mouth daily., Disp: 60 tablet, Rfl: 6 .  nitroGLYCERIN (NITROSTAT) 0.4 MG SL tablet, Place 1 tablet (0.4 mg total) under the tongue every 5 (five) minutes x 3 doses as needed for chest pain., Disp: 25 tablet, Rfl: 6 .  ranolazine (RANEXA) 500 MG 12 hr tablet, Take 1 tablet (500 mg total) by mouth 2 (two) times daily., Disp: 60 tablet, Rfl: 3 .  rosuvastatin (CRESTOR) 40 MG tablet, Take 1 tablet (40 mg total) by mouth daily., Disp: 90 tablet,  Rfl: 3 .  ticagrelor (BRILINTA) 90 MG TABS tablet, Take 1 tablet (90 mg total) by mouth 2 (two) times daily., Disp: 60 tablet, Rfl: 5  Past Medical History: Past Medical History:  Diagnosis Date  . Coronary artery disease involving native heart with angina pectoris [facility]) 2005   a. 2005: CABG x5V in [place]  b. 04/2016: PCI/DES to SVG--> RCA. Severe Ost LCx (with CTO of grafted OMs - no retrograde filling) - distal Cx unprotected; RCA & LAD essentially CTO.;; 05/2018 - SVG-RCA ostial ISR with post-stent 80% - Overlapping DES & high Atm post-dilation of ISR. Progression of Native LM-ostLCx disease  . Essential hypertension   . HCV (hepatitis C virus)    "treated" (05/08/2018)  . HLD (hyperlipidemia)   . Hx of CABG 2001   [place] - CABG 4: LIMA-LAD, SeqSVG-OM1-OM2,SVG-rPDA  . MI (myocardial infarction) ([place]) 2001  . Non-ST elevation MI (NSTEMI) ([place]) 04/2016   Culprit lesion was 90% SVG-RPDA -> DES PCI. Also noted 80% ostial native circumflex with distal circumflex potentially in jeopardy -> MEDICAL Management.  . Type 2 diabetes mellitus with complication (HCC)    CAD    Tobacco Use: Social History   Tobacco Use  Smoking Status Former Smoker  . Packs/day: 1.00  . Years: 30.00  . Pack years: 30.00  . Types: Cigarettes  . Last attempt to quit: 07/10/1996  . Years  since quitting: 22.1  Smokeless Tobacco Never Used    Labs: Recent Review Flowsheet Data    Labs for ITP Cardiac and Pulmonary Rehab Latest Ref Rng & Units 04/22/2016 12/12/2016 03/28/2017 11/22/2017 04/29/2018   Cholestrol 100 - 199 mg/dL 162 147 - 193 153   LDLCALC 0 - 99 mg/dL 95 73 - 115(H) 78   HDL >39 mg/dL 53 58 - 54 61   Trlycerides 0 - 149 mg/dL 69 82 - 126 70   TCO2 0 - 100 mmol/L - - 24 - -      Capillary Blood Glucose: Lab Results  Component Value Date   GLUCAP 133 (H) 08/13/2018   GLUCAP 113 (H) 08/13/2018   GLUCAP 85 08/11/2018   GLUCAP 98 05/09/2018   GLUCAP 86 05/09/2018     Exercise Target  Goals: Exercise Program Goal: Individual exercise prescription set using results from initial 6 min walk test and THRR while considering  patient's activity barriers and safety.   Exercise Prescription Goal: Initial exercise prescription builds to 30-45 minutes a day of aerobic activity, 2-3 days per week.  Home exercise guidelines will be given to patient during program as part of exercise prescription that the participant will acknowledge.  Activity Barriers & Risk Stratification: Activity Barriers & Cardiac Risk Stratification - 08/07/18 1119      Activity Barriers & Cardiac Risk Stratification   Activity Barriers  None    Cardiac Risk Stratification  Moderate       6 Minute Walk: 6 Minute Walk    Row Name 08/07/18 1119         6 Minute Walk   Phase  Initial     Distance  1200 feet     Walk Time  6 minutes     # of Rest Breaks  0     MPH  2.27     METS  2.54     RPE  9     VO2 Peak  8.89     Symptoms  No     Resting HR  66 bpm     Resting BP  118/78     Resting Oxygen Saturation   99 %     Exercise Oxygen Saturation  during 6 min walk  98 %     Max Ex. HR  81 bpm     Max Ex. BP  124/82     2 Minute Post BP  130/74        Oxygen Initial Assessment:   Oxygen Re-Evaluation:   Oxygen Discharge (Final Oxygen Re-Evaluation):   Initial Exercise Prescription: Initial Exercise Prescription - 08/07/18 1100      Date of Initial Exercise RX and Referring Provider   Date  08/07/18    Referring Provider  Dr. Ellyn Hack    Expected Discharge Date  11/12/18      Recumbant Bike   Level  0.7    Minutes  10    METs  2.47      NuStep   Level  2    SPM  75    Minutes  10    METs  2.3      Track   Laps  9    Minutes  10    METs  2.53      Prescription Details   Frequency (times per week)  3    Duration  Progress to 30 minutes of continuous aerobic without signs/symptoms of physical distress      Intensity  THRR 40-80% of Max Heartrate  60-119    Ratings of  Perceived Exertion  11-13      Progression   Progression  Continue to progress workloads to maintain intensity without signs/symptoms of physical distress.      Resistance Training   Training Prescription  Yes    Weight  3 lbs.     Reps  10-15       Perform Capillary Blood Glucose checks as needed.  Exercise Prescription Changes:  Exercise Prescription Changes    Row Name 08/13/18 1515             Response to Exercise   Blood Pressure (Admit)  122/70       Blood Pressure (Exercise)  132/80       Blood Pressure (Exit)  118/70       Heart Rate (Admit)  66 bpm       Heart Rate (Exercise)  99 bpm       Heart Rate (Exit)  71 bpm       Rating of Perceived Exertion (Exercise)  13       Symptoms  none       Duration  Progress to 30 minutes of  aerobic without signs/symptoms of physical distress       Intensity  THRR unchanged         Progression   Progression  Continue to progress workloads to maintain intensity without signs/symptoms of physical distress.       Average METs  2.2         Resistance Training   Training Prescription  No Relaxation day, no weights.         Interval Training   Interval Training  No         Bike   Level  0.7       Minutes  10       METs  2.47         Recumbant Bike   Level  -       Minutes  -       METs  -         NuStep   Level  2       SPM  75       Minutes  10       METs  2.2         Track   Laps  6       Minutes  10       METs  2          Exercise Comments:  Exercise Comments    Row Name 08/13/18 1630           Exercise Comments  Patient tolerated low intensity exercise without c/o. Will progress workloads as tolerated.          Exercise Goals and Review:  Exercise Goals    Row Name 08/07/18 1120             Exercise Goals   Increase Physical Activity  Yes       Intervention  Provide advice, education, support and counseling about physical activity/exercise needs.;Develop an individualized exercise  prescription for aerobic and resistive training based on initial evaluation findings, risk stratification, comorbidities and participant's personal goals.       Expected Outcomes  Short Term: Attend rehab on a regular basis to increase amount of physical activity.       Increase Strength and Stamina  Yes  Intervention  Provide advice, education, support and counseling about physical activity/exercise needs.;Develop an individualized exercise prescription for aerobic and resistive training based on initial evaluation findings, risk stratification, comorbidities and participant's personal goals.       Expected Outcomes  Short Term: Increase workloads from initial exercise prescription for resistance, speed, and METs.       Able to understand and use rate of perceived exertion (RPE) scale  Yes       Intervention  Provide education and explanation on how to use RPE scale       Expected Outcomes  Short Term: Able to use RPE daily in rehab to express subjective intensity level;Long Term:  Able to use RPE to guide intensity level when exercising independently       Knowledge and understanding of Target Heart Rate Range (THRR)  Yes       Intervention  Provide education and explanation of THRR including how the numbers were predicted and where they are located for reference       Expected Outcomes  Short Term: Able to state/look up THRR;Long Term: Able to use THRR to govern intensity when exercising independently;Short Term: Able to use daily as guideline for intensity in rehab       Able to check pulse independently  Yes       Intervention  Provide education and demonstration on how to check pulse in carotid and radial arteries.;Review the importance of being able to check your own pulse for safety during independent exercise       Expected Outcomes  Short Term: Able to explain why pulse checking is important during independent exercise;Long Term: Able to check pulse independently and accurately        Understanding of Exercise Prescription  Yes       Intervention  Provide education, explanation, and written materials on patient's individual exercise prescription       Expected Outcomes  Short Term: Able to explain program exercise prescription;Long Term: Able to explain home exercise prescription to exercise independently          Exercise Goals Re-Evaluation : Exercise Goals Re-Evaluation    [place] Name 08/13/18 1603             Exercise Goal Re-Evaluation   Exercise Goals Review  Increase Physical Activity;Able to understand and use rate of perceived exertion (RPE) scale       Comments  Patient able to understand and use RPE scale appropriately.        Expected Outcomes  Increase workloads as tolerated to help achieve personal health and fitness goals.          Discharge Exercise Prescription (Final Exercise Prescription Changes): Exercise Prescription Changes - 08/13/18 1515      Response to Exercise   Blood Pressure (Admit)  122/70    Blood Pressure (Exercise)  132/80    Blood Pressure (Exit)  118/70    Heart Rate (Admit)  66 bpm    Heart Rate (Exercise)  99 bpm    Heart Rate (Exit)  71 bpm    Rating of Perceived Exertion (Exercise)  13    Symptoms  none    Duration  Progress to 30 minutes of  aerobic without signs/symptoms of physical distress    Intensity  THRR unchanged      Progression   Progression  Continue to progress workloads to maintain intensity without signs/symptoms of physical distress.    Average METs  2.2      Resistance  Training   Training Prescription  No   Relaxation day, no weights.     Interval Training   Interval Training  No      Bike   Level  0.7    Minutes  10    METs  2.47      Recumbant Bike   Level  --    Minutes  --    METs  --      NuStep   Level  2    SPM  75    Minutes  10    METs  2.2      Track   Laps  6    Minutes  10    METs  2       Nutrition:  Target Goals: Understanding of nutrition guidelines, daily  intake of sodium <1546m, cholesterol <2057m calories 30% from fat and 7% or less from saturated fats, daily to have 5 or more servings of fruits and vegetables.  Biometrics: Pre Biometrics - 08/07/18 1120      Pre Biometrics   Height  5' 11.5" (1.816 m)    Weight  197 lb 8.5 oz (89.6 kg)    Waist Circumference  38.5 inches    Hip Circumference  41.4 inches    Waist to Hip Ratio  0.93 %    BMI (Calculated)  27.17    Triceps Skinfold  20 mm    % Body Fat  27.6 %    Grip Strength  35 kg    Flexibility  7 in    Single Leg Stand  5.31 seconds        Nutrition Therapy Plan and Nutrition Goals: Nutrition Therapy & Goals - 08/07/18 1103      Nutrition Therapy   Diet  heart healthy, diabetic      Personal Nutrition Goals   Nutrition Goal  Pt to identify and limit food sources of saturated fat, trans fat, refined carbohydrates and sodium    Personal Goal #2  Pt to describe the benefit of including fruits, vegetables, whole grains, and low-fat dairy products in a heart healthy meal plan.    Personal Goal #3  Pt able to name foods that affect blood glucose.      Intervention Plan   Intervention  Prescribe, educate and counsel regarding individualized specific dietary modifications aiming towards targeted core components such as weight, hypertension, lipid management, diabetes, heart failure and other comorbidities.    Expected Outcomes  Short Term Goal: Understand basic principles of dietary content, such as calories, fat, sodium, cholesterol and nutrients.;Long Term Goal: Adherence to prescribed nutrition plan.       Nutrition Assessments: Nutrition Assessments - 08/07/18 1104      MEDFICTS Scores   Pre Score  30       Nutrition Goals Re-Evaluation:   Nutrition Goals Re-Evaluation:   Nutrition Goals Discharge (Final Nutrition Goals Re-Evaluation):   Psychosocial: Target Goals: Acknowledge presence or absence of significant depression and/or stress, maximize coping  skills, provide positive support system. Participant is able to verbalize types and ability to use techniques and skills needed for reducing stress and depression.  Initial Review & Psychosocial Screening: Initial Psych Review & Screening - 08/07/18 1216      Initial Review   Current issues with  None Identified      Family Dynamics   Good Support System?  Yes   DaShanon Browas his wife for support   Comments  DaShanon Browaid he is having some  marital problems      Barriers   Psychosocial barriers to participate in program  The patient should benefit from training in stress management and relaxation.      Screening Interventions   Interventions  Encouraged to exercise;To provide support and resources with identified psychosocial needs    Expected Outcomes  Long Term Goal: Stressors or current issues are controlled or eliminated.       Quality of Life Scores: Quality of Life - 08/07/18 1109      Quality of Life   Select  Quality of Life      Quality of Life Scores   Health/Function Pre  22.6 %    Socioeconomic Pre  21.86 %    Psych/Spiritual Pre  24.21 %    Family Pre  20.4 %    GLOBAL Pre  22.46 %      Scores of 19 and below usually indicate a poorer quality of life in these areas.  A difference of  2-3 points is a clinically meaningful difference.  A difference of 2-3 points in the total score of the Quality of Life Index has been associated with significant improvement in overall quality of life, self-image, physical symptoms, and general health in studies assessing change in quality of life.  PHQ-9: Recent Review Flowsheet Data    Depression screen [facility] 2/9 08/13/2018   Decreased Interest 0   Down, Depressed, Hopeless 0   PHQ - 2 Score 0     Interpretation of Total Score  Total Score Depression Severity:  1-4 = Minimal depression, 5-9 = Mild depression, 10-14 = Moderate depression, 15-19 = Moderately severe depression, 20-27 = Severe depression   Psychosocial Evaluation and  Intervention:   Psychosocial Re-Evaluation:   Psychosocial Discharge (Final Psychosocial Re-Evaluation):   Vocational Rehabilitation: Provide vocational rehab assistance to qualifying candidates.   Vocational Rehab Evaluation & Intervention: Vocational Rehab - 08/07/18 1218      Initial Vocational Rehab Evaluation & Intervention   Assessment shows need for Vocational Rehabilitation  No   [name] is retired and does not need voational rehab at this time      Education: Education Goals: Education classes will be provided on a weekly basis, covering required topics. Participant will state understanding/return demonstration of topics presented.  Learning Barriers/Preferences: Learning Barriers/Preferences - 08/07/18 1122      Learning Barriers/Preferences   Learning Barriers  Sight    Learning Preferences  Individual Instruction;Video;Skilled Demonstration;Pictoral;Group Instruction       Education Topics: Count Your Pulse:  -Group instruction provided by verbal instruction, demonstration, patient participation and written materials to support subject.  Instructors address importance of being able to find your pulse and how to count your pulse when at home without a heart monitor.  Patients get hands on experience counting their pulse with staff help and individually.   Heart Attack, Angina, and Risk Factor Modification:  -Group instruction provided by verbal instruction, video, and written materials to support subject.  Instructors address signs and symptoms of angina and heart attacks.    Also discuss risk factors for heart disease and how to make changes to improve heart health risk factors.   CARDIAC REHAB PHASE II EXERCISE from 08/13/2018 in [place]  Date  08/13/18  Instruction Review Code  2- Demonstrated Understanding      Functional Fitness:  -Group instruction provided by verbal instruction, demonstration, patient participation, and  written materials to support subject.  Instructors address safety measures for doing  things around the house.  Discuss how to get up and down off the floor, how to pick things up properly, how to safely get out of a chair without assistance, and balance training.   Meditation and Mindfulness:  -Group instruction provided by verbal instruction, patient participation, and written materials to support subject.  Instructor addresses importance of mindfulness and meditation practice to help reduce stress and improve awareness.  Instructor also leads participants through a meditation exercise.    Stretching for Flexibility and Mobility:  -Group instruction provided by verbal instruction, patient participation, and written materials to support subject.  Instructors lead participants through series of stretches that are designed to increase flexibility thus improving mobility.  These stretches are additional exercise for major muscle groups that are typically performed during regular warm up and cool down.   Hands Only CPR:  -Group verbal, video, and participation provides a basic overview of AHA guidelines for community CPR. Role-play of emergencies allow participants the opportunity to practice calling for help and chest compression technique with discussion of AED use.   Hypertension: -Group verbal and written instruction that provides a basic overview of hypertension including the most recent diagnostic guidelines, risk factor reduction with self-care instructions and medication management.    Nutrition I class: Heart Healthy Eating:  -Group instruction provided by PowerPoint slides, verbal discussion, and written materials to support subject matter. The instructor gives an explanation and review of the Therapeutic Lifestyle Changes diet recommendations, which includes a discussion on lipid goals, dietary fat, sodium, fiber, plant stanol/sterol esters, sugar, and the components of a well-balanced,  healthy diet.   Nutrition II class: Lifestyle Skills:  -Group instruction provided by PowerPoint slides, verbal discussion, and written materials to support subject matter. The instructor gives an explanation and review of label reading, grocery shopping for heart health, heart healthy recipe modifications, and ways to make healthier choices when eating out.   Diabetes Question & Answer:  -Group instruction provided by PowerPoint slides, verbal discussion, and written materials to support subject matter. The instructor gives an explanation and review of diabetes co-morbidities, pre- and post-prandial blood glucose goals, pre-exercise blood glucose goals, signs, symptoms, and treatment of hypoglycemia and hyperglycemia, and foot care basics.   Diabetes Blitz:  -Group instruction provided by PowerPoint slides, verbal discussion, and written materials to support subject matter. The instructor gives an explanation and review of the physiology behind type 1 and type 2 diabetes, diabetes medications and rational behind using different medications, pre- and post-prandial blood glucose recommendations and Hemoglobin A1c goals, diabetes diet, and exercise including blood glucose guidelines for exercising safely.    Portion Distortion:  -Group instruction provided by PowerPoint slides, verbal discussion, written materials, and food models to support subject matter. The instructor gives an explanation of serving size versus portion size, changes in portions sizes over the last 20 years, and what consists of a serving from each food group.   Stress Management:  -Group instruction provided by verbal instruction, video, and written materials to support subject matter.  Instructors review role of stress in heart disease and how to cope with stress positively.     Exercising on Your Own:  -Group instruction provided by verbal instruction, power point, and written materials to support subject.  Instructors  discuss benefits of exercise, components of exercise, frequency and intensity of exercise, and end points for exercise.  Also discuss use of nitroglycerin and activating EMS.  Review options of places to exercise outside of rehab.  Review guidelines for  sex with heart disease.   Cardiac Drugs I:  -Group instruction provided by verbal instruction and written materials to support subject.  Instructor reviews cardiac drug classes: antiplatelets, anticoagulants, beta blockers, and statins.  Instructor discusses reasons, side effects, and lifestyle considerations for each drug class.   Cardiac Drugs II:  -Group instruction provided by verbal instruction and written materials to support subject.  Instructor reviews cardiac drug classes: angiotensin converting enzyme inhibitors (ACE-I), angiotensin II receptor blockers (ARBs), nitrates, and calcium channel blockers.  Instructor discusses reasons, side effects, and lifestyle considerations for each drug class.   Anatomy and Physiology of the Circulatory System:  Group verbal and written instruction and models provide basic cardiac anatomy and physiology, with the coronary electrical and arterial systems. Review of: AMI, Angina, Valve disease, Heart Failure, Peripheral Artery Disease, Cardiac Arrhythmia, Pacemakers, and the ICD.   Other Education:  -Group or individual verbal, written, or video instructions that support the educational goals of the cardiac rehab program.   Holiday Eating Survival Tips:  -Group instruction provided by PowerPoint slides, verbal discussion, and written materials to support subject matter. The instructor gives patients tips, tricks, and techniques to help them not only survive but enjoy the holidays despite the onslaught of food that accompanies the holidays.   Knowledge Questionnaire Score: Knowledge Questionnaire Score - 08/07/18 1106      Knowledge Questionnaire Score   Pre Score  20/24       Core  Components/Risk Factors/Patient Goals at Admission: Personal Goals and Risk Factors at Admission - 08/07/18 1218      Core Components/Risk Factors/Patient Goals on Admission    Weight Management  Yes;Weight Maintenance;Weight Loss    Intervention  Weight Management: Develop a combined nutrition and exercise program designed to reach desired caloric intake, while maintaining appropriate intake of nutrient and fiber, sodium and fats, and appropriate energy expenditure required for the weight goal.;Weight Management: Provide education and appropriate resources to help participant work on and attain dietary goals.;Weight Management/Obesity: Establish reasonable short term and long term weight goals.    Admit Weight  197 lb 8.5 oz (89.6 kg)    Expected Outcomes  Short Term: Continue to assess and modify interventions until short term weight is achieved;Long Term: Adherence to nutrition and physical activity/exercise program aimed toward attainment of established weight goal;Weight Maintenance: Understanding of the daily nutrition guidelines, which includes 25-35% calories from fat, 7% or less cal from saturated fats, less than 220m cholesterol, less than 1.5gm of sodium, & 5 or more servings of fruits and vegetables daily;Weight Loss: Understanding of general recommendations for a balanced deficit meal plan, which promotes 1-2 lb weight loss per week and includes a negative energy balance of [phone] kcal/d;Understanding recommendations for meals to include 15-35% energy as protein, 25-35% energy from fat, 35-60% energy from carbohydrates, less than 207mof dietary cholesterol, 20-35 gm of total fiber daily;Understanding of distribution of calorie intake throughout the day with the consumption of 4-5 meals/snacks    Diabetes  Yes    Intervention  Provide education about signs/symptoms and action to take for hypo/hyperglycemia.;Provide education about proper nutrition, including hydration, and aerobic/resistive  exercise prescription along with prescribed medications to achieve blood glucose in normal ranges: Fasting glucose 65-99 mg/dL    Expected Outcomes  Short Term: Participant verbalizes understanding of the signs/symptoms and immediate care of hyper/hypoglycemia, proper foot care and importance of medication, aerobic/resistive exercise and nutrition plan for blood glucose control.;Long Term: Attainment of HbA1C < 7%.    Hypertension  Yes    Intervention  Provide education on lifestyle modifcations including regular physical activity/exercise, weight management, moderate sodium restriction and increased consumption of fresh fruit, vegetables, and low fat dairy, alcohol moderation, and smoking cessation.;Monitor prescription use compliance.    Expected Outcomes  Short Term: Continued assessment and intervention until BP is < 140/14m HG in hypertensive participants. < 130/864mHG in hypertensive participants with diabetes, heart failure or chronic kidney disease.;Long Term: Maintenance of blood pressure at goal levels.    Lipids  Yes    Intervention  Provide education and support for participant on nutrition & aerobic/resistive exercise along with prescribed medications to achieve LDL <7063mHDL >2m36m  Expected Outcomes  Short Term: Participant states understanding of desired cholesterol values and is compliant with medications prescribed. Participant is following exercise prescription and nutrition guidelines.;Long Term: Cholesterol controlled with medications as prescribed, with individualized exercise RX and with personalized nutrition plan. Value goals: LDL < 70mg15mL > 40 mg.    Stress  Yes    Intervention  Offer individual and/or small group education and counseling on adjustment to heart disease, stress management and health-related lifestyle change. Teach and support self-help strategies.;Refer participants experiencing significant psychosocial distress to appropriate mental health specialists for  further evaluation and treatment. When possible, include family members and significant others in education/counseling sessions.    Expected Outcomes  Short Term: Participant demonstrates changes in health-related behavior, relaxation and other stress management skills, ability to obtain effective social support, and compliance with psychotropic medications if prescribed.;Long Term: Emotional wellbeing is indicated by absence of clinically significant psychosocial distress or social isolation.       Core Components/Risk Factors/Patient Goals Review:  Goals and Risk Factor Review    Row Name 08/13/18 1633             Core Components/Risk Factors/Patient Goals Review   Personal Goals Review  Weight Management/Obesity;Hypertension;Diabetes       Review  DavidShanon Browted exercise at cardiac rehab today       Expected Outcomes  DavidShanon Brow continue to participate in phase 2 cardiac rehab for exercise nutrtion and lifestyle modfications          Core Components/Risk Factors/Patient Goals at Discharge (Final Review):  Goals and Risk Factor Review - 08/13/18 1633      Core Components/Risk Factors/Patient Goals Review   Personal Goals Review  Weight Management/Obesity;Hypertension;Diabetes    Review  DavidShanon Browted exercise at cardiac rehab today    Expected Outcomes  DavidShanon Brow continue to participate in phase 2 cardiac rehab for exercise nutrtion and lifestyle modfications       ITP Comments: ITP Comments    Row Name 08/06/18 1608 08/13/18 1631         ITP Comments  Dr.Traci TurneRadford Paxical Director   30 Day ITP Review. DavidShanon Browted cardiac rehab today         Comments: Pt started cardiac rehab today.  Pt tolerated light exercise without difficulty. VSS, telemetry-Sinus Rhythm, asymptomatic.  Medication list reconciled. Pt denies barriers to medicaiton compliance.  PSYCHOSOCIAL ASSESSMENT:  PHQ-0. Pt exhibits positive coping skills, hopeful outlook with supportive family.    Pt enjoys  watching TV.   Pt oriented to exercise equipment and routine.    Understanding verbalized. [name] exercised today without complaints of chest burning.Will continue to monitor the patient throughout  the program. MariaBarnet PallBSN 08/14/2018 10:46 AM

## 2018-08-14 DIAGNOSIS — D649 Anemia, unspecified: Secondary | ICD-10-CM | POA: Diagnosis not present

## 2018-08-14 DIAGNOSIS — I10 Essential (primary) hypertension: Secondary | ICD-10-CM | POA: Diagnosis not present

## 2018-08-15 ENCOUNTER — Ambulatory Visit (HOSPITAL_COMMUNITY): Payer: Medicare Other

## 2018-08-15 ENCOUNTER — Encounter (HOSPITAL_COMMUNITY): Payer: Medicare Other

## 2018-08-18 ENCOUNTER — Ambulatory Visit (HOSPITAL_COMMUNITY): Payer: Medicare Other

## 2018-08-18 ENCOUNTER — Telehealth (HOSPITAL_COMMUNITY): Payer: Self-pay | Admitting: General Practice

## 2018-08-18 ENCOUNTER — Encounter (HOSPITAL_COMMUNITY): Payer: Medicare Other

## 2018-08-19 ENCOUNTER — Ambulatory Visit: Payer: Medicare Other | Admitting: Cardiology

## 2018-08-20 ENCOUNTER — Ambulatory Visit (HOSPITAL_COMMUNITY): Payer: Medicare Other

## 2018-08-20 ENCOUNTER — Encounter (HOSPITAL_COMMUNITY)
Admission: RE | Admit: 2018-08-20 | Discharge: 2018-08-20 | Disposition: A | Discharge status: Home / Self Care | Payer: Medicare Other | Source: Ambulatory Visit | Attending: Cardiology | Admitting: Cardiology

## 2018-08-20 DIAGNOSIS — Z955 Presence of coronary angioplasty implant and graft: Secondary | ICD-10-CM | POA: Diagnosis not present

## 2018-08-20 DIAGNOSIS — E785 Hyperlipidemia, unspecified: Secondary | ICD-10-CM | POA: Diagnosis not present

## 2018-08-20 DIAGNOSIS — I214 Non-ST elevation (NSTEMI) myocardial infarction: Secondary | ICD-10-CM | POA: Diagnosis not present

## 2018-08-20 DIAGNOSIS — E119 Type 2 diabetes mellitus without complications: Secondary | ICD-10-CM | POA: Diagnosis not present

## 2018-08-20 DIAGNOSIS — I25119 Atherosclerotic heart disease of native coronary artery with unspecified angina pectoris: Secondary | ICD-10-CM | POA: Diagnosis not present

## 2018-08-20 DIAGNOSIS — I10 Essential (primary) hypertension: Secondary | ICD-10-CM | POA: Diagnosis not present

## 2018-08-20 LAB — GLUCOSE, CAPILLARY: Glucose-Capillary: 123 mg/dL — ABNORMAL HIGH (ref 70–99)

## 2018-08-20 NOTE — Progress Notes (Signed)
Reviewed home exercise guidelines with patient including endpoints, temperature precautions, target heart rate and rate of perceived exertion. Pt plans to walk as his mode of home exercise. Pt voices understanding of instructions given.   Anastyn Ayars M Makenleigh Crownover, MS, ACSM CEP  

## 2018-08-21 ENCOUNTER — Other Ambulatory Visit: Payer: Self-pay | Admitting: Cardiology

## 2018-08-21 ENCOUNTER — Telehealth: Payer: Self-pay | Admitting: Cardiology

## 2018-08-21 ENCOUNTER — Other Ambulatory Visit: Payer: Self-pay

## 2018-08-21 MED ORDER — TICAGRELOR 90 MG PO TABS: 90.0000 mg | ORAL_TABLET | Freq: Two times a day (BID) | ORAL | 5 refills | Status: DC

## 2018-08-21 MED ORDER — RANOLAZINE ER 500 MG PO TB12: 500.0000 mg | ORAL_TABLET | Freq: Two times a day (BID) | ORAL | 5 refills | Status: DC

## 2018-08-21 NOTE — Telephone Encounter (Signed)
Wife will stop by to pick up printed script

## 2018-08-21 NOTE — Telephone Encounter (Signed)
New message    Pt wife is calling stating that last time they were in pt was given prescription for Renexa so they could go get prices for the medication. Pt wife states that it has been misplaced and they need a new one because the patient is almost out of medication. Please call

## 2018-08-21 NOTE — Telephone Encounter (Signed)
New message    *STAT* If patient is at the pharmacy, call can be transferred to refill team.   1. Which medications need to be refilled? (please list name of each medication and dose if known) Renexa 500 mg  2. Which pharmacy/location (including street and city if local pharmacy) is medication to be sent to? [place] [facility]   3. Do they need a 30 day or 90 day supply? 30 day    Pt spouse calling because wrong rx was sent to pharmacy. We sent Berillinta instead of renexa.

## 2018-08-22 ENCOUNTER — Ambulatory Visit (HOSPITAL_COMMUNITY): Payer: Medicare Other

## 2018-08-22 ENCOUNTER — Encounter (HOSPITAL_COMMUNITY)
Admission: RE | Admit: 2018-08-22 | Discharge: 2018-08-22 | Disposition: A | Discharge status: Home / Self Care | Payer: Medicare Other | Source: Ambulatory Visit | Attending: Cardiology | Admitting: Cardiology

## 2018-08-22 ENCOUNTER — Telehealth: Payer: Self-pay | Admitting: Physician Assistant

## 2018-08-22 DIAGNOSIS — Z955 Presence of coronary angioplasty implant and graft: Secondary | ICD-10-CM | POA: Diagnosis not present

## 2018-08-22 DIAGNOSIS — E119 Type 2 diabetes mellitus without complications: Secondary | ICD-10-CM | POA: Diagnosis not present

## 2018-08-22 DIAGNOSIS — E785 Hyperlipidemia, unspecified: Secondary | ICD-10-CM | POA: Diagnosis not present

## 2018-08-22 DIAGNOSIS — I10 Essential (primary) hypertension: Secondary | ICD-10-CM | POA: Diagnosis not present

## 2018-08-22 DIAGNOSIS — I214 Non-ST elevation (NSTEMI) myocardial infarction: Secondary | ICD-10-CM | POA: Diagnosis not present

## 2018-08-22 DIAGNOSIS — I25119 Atherosclerotic heart disease of native coronary artery with unspecified angina pectoris: Secondary | ICD-10-CM | POA: Diagnosis not present

## 2018-08-22 NOTE — Progress Notes (Signed)
Patient reports having chest burning today when his work load was increased to a 4 on the nustep. [name] stopped to rest his chest burning went away. Telemetry rhythm Sinus rhythm, Sinus tach 107. Blood pressure 128/70. Oxygen saturation 100% on room air. [name] stopped to rest and is now proceeding with exercise and is having no further symptoms presently. [name] says he continues to have burning in his chest almost daily but is has not been as bad since his medications have been adjusted at his recent office visit. Sharrell Ku [facility] paged and notified about today's symptoms. Hinton Dyer talked with [name] over the phone and instructed him to increase his Ranexa to 1000 mg twice a day over the weekend and to to call Dr Allison Quarry office and to let him know how he is doing. [name] completed exercise at cardiac rehab without further signs or symptoms.Will continue to monitor the patient throughout  the program.Maria Venetia Maxon, RN,BSN 08/22/2018 4:48 PM

## 2018-08-22 NOTE — Telephone Encounter (Signed)
Maria with cardiac rehab called. Patient has chronic angina. He did experience his stereotypical chest burning with exertion this afternoon at higher level of activity pushing himself. When he reduced the speed the chest pain resolved and he was able to continue exercising otherwise without issue. BP 120/70s, HR stable, O2 sat stable. I spoke with Almyra Deforest who had seen pt recently and outlined history in stellar office note. He recommends patient increase Ranexa to 1000mg  BID for now and see how he does. This refill was just sent in yesterday.I told patient to take 2 of the 500mg  tablets twice a day for now, and call with an update on how he is doing early next week. If doing well would suggest refilling the Ranexa at the 1000mg  dose under Hao's name to maintain continuity since I am otherwise not typically involved in patient's care. Recent QTc wnl. Pt verbalized understanding/gratitude.   Will also route to Dr. Ellyn Hack just as Juluis Rainier.  Macgregor Aeschliman PA-C

## 2018-08-22 NOTE — Telephone Encounter (Signed)
Sounds like a good plan

## 2018-08-25 ENCOUNTER — Ambulatory Visit (HOSPITAL_COMMUNITY): Payer: Medicare Other

## 2018-08-25 ENCOUNTER — Telehealth: Payer: Self-pay | Admitting: Cardiology

## 2018-08-25 ENCOUNTER — Telehealth: Payer: Self-pay | Admitting: Physician Assistant

## 2018-08-25 ENCOUNTER — Encounter (HOSPITAL_COMMUNITY)
Admission: RE | Admit: 2018-08-25 | Discharge: 2018-08-25 | Disposition: A | Discharge status: Home / Self Care | Payer: Medicare Other | Source: Ambulatory Visit | Attending: Cardiology | Admitting: Cardiology

## 2018-08-25 DIAGNOSIS — I10 Essential (primary) hypertension: Secondary | ICD-10-CM | POA: Diagnosis not present

## 2018-08-25 DIAGNOSIS — Z955 Presence of coronary angioplasty implant and graft: Secondary | ICD-10-CM | POA: Diagnosis not present

## 2018-08-25 DIAGNOSIS — E119 Type 2 diabetes mellitus without complications: Secondary | ICD-10-CM | POA: Diagnosis not present

## 2018-08-25 DIAGNOSIS — I214 Non-ST elevation (NSTEMI) myocardial infarction: Secondary | ICD-10-CM | POA: Diagnosis not present

## 2018-08-25 DIAGNOSIS — I25119 Atherosclerotic heart disease of native coronary artery with unspecified angina pectoris: Secondary | ICD-10-CM | POA: Diagnosis not present

## 2018-08-25 DIAGNOSIS — D649 Anemia, unspecified: Secondary | ICD-10-CM | POA: Diagnosis not present

## 2018-08-25 DIAGNOSIS — E785 Hyperlipidemia, unspecified: Secondary | ICD-10-CM | POA: Diagnosis not present

## 2018-08-25 NOTE — Telephone Encounter (Signed)
  [name] is calling to notify Dr Ellyn Hack that Mr Brandon Vasquez has stopped taking the Ranexa. Please give her a call to discuss.

## 2018-08-25 NOTE — Progress Notes (Addendum)
Patient reports that he stopped taking Ranexa because he saw there are side effects that can affect your kidneys. Will notify Dr Allison Quarry office.Barnet Pall, RN,BSN 08/25/2018 3:16 PM

## 2018-08-25 NOTE — Telephone Encounter (Signed)
New Message   Pt c/o medication issue:  1. Name of Medication: ranolazine (RANEXA) 500 MG 12 hr tablet  2. How are you currently taking this medication (dosage and times per day)? Take 1 tablet (500 mg total) by mouth 2 (two) times daily.  3. Are you having a reaction (difficulty breathing--STAT)? no  4. What is your medication issue? Pt's wife is calling, states that the pt was diagnosed with Kidney disease and states this medication is not good for Kidneys. Please call

## 2018-08-25 NOTE — Telephone Encounter (Signed)
Spoke with pt wife, she reports his medical doctor said there was protein and blood in his urine and they placed him on losartan potassium 25 mg once daily for that reason. She is concerned about the ranexa causing more problems. Will forward to hao meng pa to review and advise.

## 2018-08-25 NOTE — Telephone Encounter (Signed)
Spoke to Brandon Vasquez she wanted to make Dr. Ellyn Hack aware that he stopped Ranexa due to concerns with side effects.     Per chart review-message has been sent to [facility] in regards.

## 2018-08-28 NOTE — Telephone Encounter (Signed)
ranexa has been stopped

## 2018-08-28 NOTE — Telephone Encounter (Signed)
Based on another phone note, it appears patient has stopped Ranexa. Personally, I think the probability that Ranexa is causing the protein and blood in the urine is very low.

## 2018-08-29 ENCOUNTER — Ambulatory Visit (HOSPITAL_COMMUNITY): Payer: Medicare Other

## 2018-08-29 ENCOUNTER — Encounter (HOSPITAL_COMMUNITY): Payer: Medicare Other

## 2018-09-01 ENCOUNTER — Ambulatory Visit (HOSPITAL_COMMUNITY): Payer: Medicare Other

## 2018-09-01 ENCOUNTER — Encounter (HOSPITAL_COMMUNITY)
Admission: RE | Admit: 2018-09-01 | Discharge: 2018-09-01 | Disposition: A | Discharge status: Home / Self Care | Payer: Medicare Other | Source: Ambulatory Visit | Attending: Cardiology | Admitting: Cardiology

## 2018-09-01 DIAGNOSIS — E119 Type 2 diabetes mellitus without complications: Secondary | ICD-10-CM | POA: Diagnosis not present

## 2018-09-01 DIAGNOSIS — I25119 Atherosclerotic heart disease of native coronary artery with unspecified angina pectoris: Secondary | ICD-10-CM | POA: Diagnosis not present

## 2018-09-01 DIAGNOSIS — Z955 Presence of coronary angioplasty implant and graft: Secondary | ICD-10-CM | POA: Diagnosis not present

## 2018-09-01 DIAGNOSIS — I10 Essential (primary) hypertension: Secondary | ICD-10-CM | POA: Diagnosis not present

## 2018-09-01 DIAGNOSIS — I214 Non-ST elevation (NSTEMI) myocardial infarction: Secondary | ICD-10-CM | POA: Diagnosis not present

## 2018-09-01 DIAGNOSIS — E785 Hyperlipidemia, unspecified: Secondary | ICD-10-CM | POA: Diagnosis not present

## 2018-09-01 LAB — GLUCOSE, CAPILLARY
GLUCOSE-CAPILLARY: 94 mg/dL (ref 70–99)
Glucose-Capillary: 100 mg/dL — ABNORMAL HIGH (ref 70–99)

## 2018-09-05 ENCOUNTER — Encounter (HOSPITAL_COMMUNITY)
Admission: RE | Admit: 2018-09-05 | Discharge: 2018-09-05 | Disposition: A | Discharge status: Home / Self Care | Payer: Medicare Other | Source: Ambulatory Visit | Attending: Cardiology | Admitting: Cardiology

## 2018-09-05 ENCOUNTER — Ambulatory Visit (HOSPITAL_COMMUNITY): Payer: Medicare Other

## 2018-09-05 ENCOUNTER — Encounter (HOSPITAL_COMMUNITY): Payer: Medicare Other

## 2018-09-05 DIAGNOSIS — Z87891 Personal history of nicotine dependence: Secondary | ICD-10-CM | POA: Diagnosis not present

## 2018-09-05 DIAGNOSIS — I25119 Atherosclerotic heart disease of native coronary artery with unspecified angina pectoris: Secondary | ICD-10-CM | POA: Diagnosis not present

## 2018-09-05 DIAGNOSIS — Z955 Presence of coronary angioplasty implant and graft: Secondary | ICD-10-CM | POA: Insufficient documentation

## 2018-09-05 DIAGNOSIS — E119 Type 2 diabetes mellitus without complications: Secondary | ICD-10-CM | POA: Insufficient documentation

## 2018-09-05 DIAGNOSIS — I10 Essential (primary) hypertension: Secondary | ICD-10-CM | POA: Insufficient documentation

## 2018-09-05 DIAGNOSIS — E785 Hyperlipidemia, unspecified: Secondary | ICD-10-CM | POA: Diagnosis not present

## 2018-09-05 DIAGNOSIS — I214 Non-ST elevation (NSTEMI) myocardial infarction: Secondary | ICD-10-CM | POA: Insufficient documentation

## 2018-09-08 ENCOUNTER — Ambulatory Visit (HOSPITAL_COMMUNITY): Payer: Medicare Other

## 2018-09-08 ENCOUNTER — Encounter (HOSPITAL_COMMUNITY)
Admission: RE | Admit: 2018-09-08 | Discharge: 2018-09-08 | Disposition: A | Discharge status: Home / Self Care | Payer: Medicare Other | Source: Ambulatory Visit | Attending: Cardiology | Admitting: Cardiology

## 2018-09-08 DIAGNOSIS — Z955 Presence of coronary angioplasty implant and graft: Secondary | ICD-10-CM

## 2018-09-08 DIAGNOSIS — I10 Essential (primary) hypertension: Secondary | ICD-10-CM | POA: Diagnosis not present

## 2018-09-08 DIAGNOSIS — E119 Type 2 diabetes mellitus without complications: Secondary | ICD-10-CM | POA: Diagnosis not present

## 2018-09-08 DIAGNOSIS — I214 Non-ST elevation (NSTEMI) myocardial infarction: Secondary | ICD-10-CM | POA: Diagnosis not present

## 2018-09-08 DIAGNOSIS — I25119 Atherosclerotic heart disease of native coronary artery with unspecified angina pectoris: Secondary | ICD-10-CM | POA: Diagnosis not present

## 2018-09-08 DIAGNOSIS — E785 Hyperlipidemia, unspecified: Secondary | ICD-10-CM | POA: Diagnosis not present

## 2018-09-10 ENCOUNTER — Ambulatory Visit (HOSPITAL_COMMUNITY): Payer: Medicare Other

## 2018-09-10 ENCOUNTER — Encounter (HOSPITAL_COMMUNITY)
Admission: RE | Admit: 2018-09-10 | Discharge: 2018-09-10 | Disposition: A | Discharge status: Home / Self Care | Payer: Medicare Other | Source: Ambulatory Visit | Attending: Cardiology | Admitting: Cardiology

## 2018-09-10 DIAGNOSIS — Z955 Presence of coronary angioplasty implant and graft: Secondary | ICD-10-CM

## 2018-09-10 DIAGNOSIS — E119 Type 2 diabetes mellitus without complications: Secondary | ICD-10-CM | POA: Diagnosis not present

## 2018-09-10 DIAGNOSIS — I214 Non-ST elevation (NSTEMI) myocardial infarction: Secondary | ICD-10-CM | POA: Diagnosis not present

## 2018-09-10 DIAGNOSIS — I25119 Atherosclerotic heart disease of native coronary artery with unspecified angina pectoris: Secondary | ICD-10-CM | POA: Diagnosis not present

## 2018-09-10 DIAGNOSIS — I10 Essential (primary) hypertension: Secondary | ICD-10-CM | POA: Diagnosis not present

## 2018-09-10 DIAGNOSIS — E785 Hyperlipidemia, unspecified: Secondary | ICD-10-CM | POA: Diagnosis not present

## 2018-09-11 NOTE — Progress Notes (Signed)
Cardiac Individual Treatment Plan  Patient Details  Name: MCGUIRE GASPARYAN MRN: [id number] Date of Birth: May 24, 1946 Referring Provider:     [place] from 08/07/2018 in [place]  Referring Provider  Dr. Ellyn Hack      Initial Encounter Date:    CARDIAC REHAB PHASE II ORIENTATION from 08/07/2018 in [place]  Date  08/07/18      Visit Diagnosis: 05/09/2018 Stented coronary artery S/P DES RPDA Graft  Patient's Home Medications on Admission:  Current Outpatient Medications:  .  amLODipine (NORVASC) 10 MG tablet, Take 1 tablet (10 mg total) by mouth daily., Disp: 90 tablet, Rfl: 4 .  aspirin 81 MG EC tablet, Take 81 mg by mouth daily. Swallow whole., Disp: , Rfl:  .  ezetimibe (ZETIA) 10 MG tablet, Take 1 tablet (10 mg total) by mouth daily., Disp: 30 tablet, Rfl: 6 .  HYDROcodone-acetaminophen (NORCO) 5-325 MG tablet, Take 1 tablet by mouth every 6 (six) hours as needed for moderate pain. (Patient not taking: Reported on 08/04/2018), Disp: 10 tablet, Rfl: 0 .  Hypromellose (ARTIFICIAL TEARS OP), Apply 1-2 drops to eye daily as needed (dry eyes)., Disp: , Rfl:  .  isosorbide mononitrate (IMDUR) 60 MG 24 hr tablet, Take 1.5 tablets (90 mg total) by mouth daily., Disp: 135 tablet, Rfl: 1 .  metFORMIN (GLUCOPHAGE-XR) 500 MG 24 hr tablet, Take 500 mg by mouth daily. , Disp: , Rfl:  .  metoprolol succinate (TOPROL XL) 25 MG 24 hr tablet, Take 1 tablet (25 mg total) by mouth daily., Disp: 60 tablet, Rfl: 6 .  nitroGLYCERIN (NITROSTAT) 0.4 MG SL tablet, Place 1 tablet (0.4 mg total) under the tongue every 5 (five) minutes x 3 doses as needed for chest pain., Disp: 25 tablet, Rfl: 6 .  ranolazine (RANEXA) 500 MG 12 hr tablet, Take 1 tablet (500 mg total) by mouth 2 (two) times daily., Disp: 60 tablet, Rfl: 5 .  rosuvastatin (CRESTOR) 40 MG tablet, Take 1 tablet (40 mg total) by mouth daily., Disp: 90 tablet,  Rfl: 3 .  ticagrelor (BRILINTA) 90 MG TABS tablet, Take 1 tablet (90 mg total) by mouth 2 (two) times daily., Disp: 60 tablet, Rfl: 5  Past Medical History: Past Medical History:  Diagnosis Date  . Coronary artery disease involving native heart with angina pectoris [facility] 2005   a. 2005: CABG x5V in [place]  b. 04/2016: PCI/DES to SVG--> RCA. Severe Ost LCx (with CTO of grafted OMs - no retrograde filling) - distal Cx unprotected; RCA & LAD essentially CTO.;; 05/2018 - SVG-RCA ostial ISR with post-stent 80% - Overlapping DES & high Atm post-dilation of ISR. Progression of Native LM-ostLCx disease  . Essential hypertension   . HCV (hepatitis C virus)    "treated" (05/08/2018)  . HLD (hyperlipidemia)   . Hx of CABG 2001   [place] - CABG 4: LIMA-LAD, SeqSVG-OM1-OM2,SVG-rPDA  . MI (myocardial infarction) ([place]) 2001  . Non-ST elevation MI (NSTEMI) ([place]) 04/2016   Culprit lesion was 90% SVG-RPDA -> DES PCI. Also noted 80% ostial native circumflex with distal circumflex potentially in jeopardy -> MEDICAL Management.  . Type 2 diabetes mellitus with complication (HCC)    CAD    Tobacco Use: Social History   Tobacco Use  Smoking Status Former Smoker  . Packs/day: 1.00  . Years: 30.00  . Pack years: 30.00  . Types: Cigarettes  . Last attempt to quit: 07/10/1996  . Years  since quitting: 22.1  Smokeless Tobacco Never Used    Labs: Recent Review Flowsheet Data    Labs for ITP Cardiac and Pulmonary Rehab Latest Ref Rng & Units 04/22/2016 12/12/2016 03/28/2017 11/22/2017 04/29/2018   Cholestrol 100 - 199 mg/dL 162 147 - 193 153   LDLCALC 0 - 99 mg/dL 95 73 - 115(H) 78   HDL >39 mg/dL 53 58 - 54 61   Trlycerides 0 - 149 mg/dL 69 82 - 126 70   TCO2 0 - 100 mmol/L - - 24 - -      Capillary Blood Glucose: Lab Results  Component Value Date   GLUCAP 100 (H) 09/01/2018   GLUCAP 94 09/01/2018   GLUCAP 123 (H) 08/20/2018   GLUCAP 133 (H) 08/13/2018   GLUCAP 113 (H) 08/13/2018     Exercise  Target Goals: Exercise Program Goal: Individual exercise prescription set using results from initial 6 min walk test and THRR while considering  patient's activity barriers and safety.   Exercise Prescription Goal: Initial exercise prescription builds to 30-45 minutes a day of aerobic activity, 2-3 days per week.  Home exercise guidelines will be given to patient during program as part of exercise prescription that the participant will acknowledge.  Activity Barriers & Risk Stratification: Activity Barriers & Cardiac Risk Stratification - 08/07/18 1119      Activity Barriers & Cardiac Risk Stratification   Activity Barriers  None    Cardiac Risk Stratification  Moderate       6 Minute Walk: 6 Minute Walk    Row Name 08/07/18 1119         6 Minute Walk   Phase  Initial     Distance  1200 feet     Walk Time  6 minutes     # of Rest Breaks  0     MPH  2.27     METS  2.54     RPE  9     VO2 Peak  8.89     Symptoms  No     Resting HR  66 bpm     Resting BP  118/78     Resting Oxygen Saturation   99 %     Exercise Oxygen Saturation  during 6 min walk  98 %     Max Ex. HR  81 bpm     Max Ex. BP  124/82     2 Minute Post BP  130/74        Oxygen Initial Assessment:   Oxygen Re-Evaluation:   Oxygen Discharge (Final Oxygen Re-Evaluation):   Initial Exercise Prescription: Initial Exercise Prescription - 08/07/18 1100      Date of Initial Exercise RX and Referring Provider   Date  08/07/18    Referring Provider  Dr. Ellyn Hack    Expected Discharge Date  11/12/18      Recumbant Bike   Level  0.7    Minutes  10    METs  2.47      NuStep   Level  2    SPM  75    Minutes  10    METs  2.3      Track   Laps  9    Minutes  10    METs  2.53      Prescription Details   Frequency (times per week)  3    Duration  Progress to 30 minutes of continuous aerobic without signs/symptoms of physical distress  Intensity   THRR 40-80% of Max Heartrate  60-119     Ratings of Perceived Exertion  11-13      Progression   Progression  Continue to progress workloads to maintain intensity without signs/symptoms of physical distress.      Resistance Training   Training Prescription  Yes    Weight  3 lbs.     Reps  10-15       Perform Capillary Blood Glucose checks as needed.  Exercise Prescription Changes: Exercise Prescription Changes    Row Name 08/13/18 1515 08/20/18 1511 09/01/18 1451         Response to Exercise   Blood Pressure (Admit)  122/70  108/72  122/68     Blood Pressure (Exercise)  132/80  140/72  138/80     Blood Pressure (Exit)  118/70  108/74  118/76     Heart Rate (Admit)  66 bpm  83 bpm  95 bpm     Heart Rate (Exercise)  99 bpm  96 bpm  103 bpm     Heart Rate (Exit)  71 bpm  83 bpm  74 bpm     Rating of Perceived Exertion (Exercise)  '13  11  13     ' Symptoms  none  none  none     Duration  Progress to 30 minutes of  aerobic without signs/symptoms of physical distress  Progress to 30 minutes of  aerobic without signs/symptoms of physical distress  Progress to 30 minutes of  aerobic without signs/symptoms of physical distress     Intensity  THRR unchanged  THRR unchanged  THRR unchanged       Progression   Progression  Continue to progress workloads to maintain intensity without signs/symptoms of physical distress.  Continue to progress workloads to maintain intensity without signs/symptoms of physical distress.  Continue to progress workloads to maintain intensity without signs/symptoms of physical distress.     Average METs  2.2  2.7  2.8       Resistance Training   Training Prescription  No Relaxation day, no weights.  No Relaxation day, no weights.  Yes     Weight  -  -  5lbs     Reps  -  -  10-15     Time  -  -  10 Minutes       Interval Training   Interval Training  No  No  No       Bike   Level  0.7  0.9  0.9     Minutes  '10  10  10     ' METs  2.47  2.9  2.91       Recumbant Bike   Level  -  -  -     Minutes   -  -  -     METs  -  -  -       NuStep   Level  '2  2  4     ' SPM  75  75  75     Minutes  '10  10  10     ' METs  2.2  2.5  2.7       Track   Laps  '6  9  3     ' Minutes  10  10  -     METs  2  2.57  -       Home Exercise Plan   Plans to continue exercise at  -  Home (comment) Walking  Home (comment) Walking     Frequency  -  Add 1 additional day to program exercise sessions.  Add 1 additional day to program exercise sessions.     Initial Home Exercises Provided  -  08/20/18  08/20/18        Exercise Comments: Exercise Comments    Row Name 08/13/18 1630 08/20/18 1534 09/01/18 1545       Exercise Comments  Patient tolerated low intensity exercise without c/o. Will progress workloads as tolerated.  Reviewed home exercise guidelines, METs, and goals with patient.  Reviewed METs with patient.        Exercise Goals and Review: Exercise Goals    Row Name 08/07/18 1120             Exercise Goals   Increase Physical Activity  Yes       Intervention  Provide advice, education, support and counseling about physical activity/exercise needs.;Develop an individualized exercise prescription for aerobic and resistive training based on initial evaluation findings, risk stratification, comorbidities and participant's personal goals.       Expected Outcomes  Short Term: Attend rehab on a regular basis to increase amount of physical activity.       Increase Strength and Stamina  Yes       Intervention  Provide advice, education, support and counseling about physical activity/exercise needs.;Develop an individualized exercise prescription for aerobic and resistive training based on initial evaluation findings, risk stratification, comorbidities and participant's personal goals.       Expected Outcomes  Short Term: Increase workloads from initial exercise prescription for resistance, speed, and METs.       Able to understand and use rate of perceived exertion (RPE) scale  Yes       Intervention   Provide education and explanation on how to use RPE scale       Expected Outcomes  Short Term: Able to use RPE daily in rehab to express subjective intensity level;Long Term:  Able to use RPE to guide intensity level when exercising independently       Knowledge and understanding of Target Heart Rate Range (THRR)  Yes       Intervention  Provide education and explanation of THRR including how the numbers were predicted and where they are located for reference       Expected Outcomes  Short Term: Able to state/look up THRR;Long Term: Able to use THRR to govern intensity when exercising independently;Short Term: Able to use daily as guideline for intensity in rehab       Able to check pulse independently  Yes       Intervention  Provide education and demonstration on how to check pulse in carotid and radial arteries.;Review the importance of being able to check your own pulse for safety during independent exercise       Expected Outcomes  Short Term: Able to explain why pulse checking is important during independent exercise;Long Term: Able to check pulse independently and accurately       Understanding of Exercise Prescription  Yes       Intervention  Provide education, explanation, and written materials on patient's individual exercise prescription       Expected Outcomes  Short Term: Able to explain program exercise prescription;Long Term: Able to explain home exercise prescription to exercise independently          Exercise Goals Re-Evaluation : Exercise Goals Re-Evaluation    Row Name 08/13/18 1603 08/20/18 1534  Exercise Goal Re-Evaluation   Exercise Goals Review  Increase Physical Activity;Able to understand and use rate of perceived exertion (RPE) scale  Increase Physical Activity;Able to understand and use rate of perceived exertion (RPE) scale;Knowledge and understanding of Target Heart Rate Range (THRR);Understanding of Exercise Prescription;Increase Strength and Stamina       Comments  Patient able to understand and use RPE scale appropriately.   Reviewed home exercise guidelines with patient including THRR, RPE scale, and endpoints for exercise. Pt has treadmill, bike, and weights at home, but is not currenlty exercising. Pt states he needs the motivation to exercise.      Expected Outcomes  Increase workloads as tolerated to help achieve personal health and fitness goals.  Pt will walk at least one day at home in addition to exercise at cardiac rehab. Increase workloads at CR to help build confidence and motivation for exercise.         Discharge Exercise Prescription (Final Exercise Prescription Changes): Exercise Prescription Changes - 09/01/18 1451      Response to Exercise   Blood Pressure (Admit)  122/68    Blood Pressure (Exercise)  138/80    Blood Pressure (Exit)  118/76    Heart Rate (Admit)  95 bpm    Heart Rate (Exercise)  103 bpm    Heart Rate (Exit)  74 bpm    Rating of Perceived Exertion (Exercise)  13    Symptoms  none    Duration  Progress to 30 minutes of  aerobic without signs/symptoms of physical distress    Intensity  THRR unchanged      Progression   Progression  Continue to progress workloads to maintain intensity without signs/symptoms of physical distress.    Average METs  2.8      Resistance Training   Training Prescription  Yes    Weight  5lbs    Reps  10-15    Time  10 Minutes      Interval Training   Interval Training  No      Bike   Level  0.9    Minutes  10    METs  2.91      NuStep   Level  4    SPM  75    Minutes  10    METs  2.7      Track   Laps  3    Minutes  --    METs  --      Home Exercise Plan   Plans to continue exercise at  Home (comment)   Walking   Frequency  Add 1 additional day to program exercise sessions.    Initial Home Exercises Provided  08/20/18       Nutrition:  Target Goals: Understanding of nutrition guidelines, daily intake of sodium <1518m, cholesterol <2059m calories 30%  from fat and 7% or less from saturated fats, daily to have 5 or more servings of fruits and vegetables.  Biometrics: Pre Biometrics - 08/07/18 1120      Pre Biometrics   Height  5' 11.5" (1.816 m)    Weight  89.6 kg    Waist Circumference  38.5 inches    Hip Circumference  41.4 inches    Waist to Hip Ratio  0.93 %    BMI (Calculated)  27.17    Triceps Skinfold  20 mm    % Body Fat  27.6 %    Grip Strength  35 kg    Flexibility  7  in    Single Leg Stand  5.31 seconds        Nutrition Therapy Plan and Nutrition Goals: Nutrition Therapy & Goals - 08/07/18 1103      Nutrition Therapy   Diet  heart healthy, diabetic      Personal Nutrition Goals   Nutrition Goal  Pt to identify and limit food sources of saturated fat, trans fat, refined carbohydrates and sodium    Personal Goal #2  Pt to describe the benefit of including fruits, vegetables, whole grains, and low-fat dairy products in a heart healthy meal plan.    Personal Goal #3  Pt able to name foods that affect blood glucose.      Intervention Plan   Intervention  Prescribe, educate and counsel regarding individualized specific dietary modifications aiming towards targeted core components such as weight, hypertension, lipid management, diabetes, heart failure and other comorbidities.    Expected Outcomes  Short Term Goal: Understand basic principles of dietary content, such as calories, fat, sodium, cholesterol and nutrients.;Long Term Goal: Adherence to prescribed nutrition plan.       Nutrition Assessments: Nutrition Assessments - 08/07/18 1104      MEDFICTS Scores   Pre Score  30       Nutrition Goals Re-Evaluation:   Nutrition Goals Re-Evaluation:   Nutrition Goals Discharge (Final Nutrition Goals Re-Evaluation):   Psychosocial: Target Goals: Acknowledge presence or absence of significant depression and/or stress, maximize coping skills, provide positive support system. Participant is able to verbalize types  and ability to use techniques and skills needed for reducing stress and depression.  Initial Review & Psychosocial Screening: Initial Psych Review & Screening - 08/07/18 1216      Initial Review   Current issues with  None Identified      Family Dynamics   Good Support System?  Yes   [name] has his wife for support   Comments  [name] said he is having some marital problems      Barriers   Psychosocial barriers to participate in program  The patient should benefit from training in stress management and relaxation.      Screening Interventions   Interventions  Encouraged to exercise;To provide support and resources with identified psychosocial needs    Expected Outcomes  Long Term Goal: Stressors or current issues are controlled or eliminated.       Quality of Life Scores: Quality of Life - 08/07/18 1109      Quality of Life   Select  Quality of Life      Quality of Life Scores   Health/Function Pre  22.6 %    Socioeconomic Pre  21.86 %    Psych/Spiritual Pre  24.21 %    Family Pre  20.4 %    GLOBAL Pre  22.46 %      Scores of 19 and below usually indicate a poorer quality of life in these areas.  A difference of  2-3 points is a clinically meaningful difference.  A difference of 2-3 points in the total score of the Quality of Life Index has been associated with significant improvement in overall quality of life, self-image, physical symptoms, and general health in studies assessing change in quality of life.  PHQ-9: Recent Review Flowsheet Data    Depression screen [facility] 2/9 08/13/2018   Decreased Interest 0   Down, Depressed, Hopeless 0   PHQ - 2 Score 0     Interpretation of Total Score  Total Score Depression Severity:  1-4 = Minimal depression, 5-9 = Mild depression, 10-14 = Moderate depression, 15-19 = Moderately severe depression, 20-27 = Severe depression   Psychosocial Evaluation and Intervention:   Psychosocial Re-Evaluation: Psychosocial Re-Evaluation     [place] Name 09/11/18 1357             Psychosocial Re-Evaluation   Current issues with  Current Stress Concerns       Comments  [name] has not voiced any further increased stressors will continue to monitor the patient.       Interventions  Stress management education       Continue Psychosocial Services   No Follow up required         Initial Review   Source of Stress Concerns  Chronic Illness          Psychosocial Discharge (Final Psychosocial Re-Evaluation): Psychosocial Re-Evaluation - 09/11/18 1357      Psychosocial Re-Evaluation   Current issues with  Current Stress Concerns    Comments  [name] has not voiced any further increased stressors will continue to monitor the patient.    Interventions  Stress management education    Continue Psychosocial Services   No Follow up required      Initial Review   Source of Stress Concerns  Chronic Illness       Vocational Rehabilitation: Provide vocational rehab assistance to qualifying candidates.   Vocational Rehab Evaluation & Intervention: Vocational Rehab - 08/07/18 1218      Initial Vocational Rehab Evaluation & Intervention   Assessment shows need for Vocational Rehabilitation  No   [name] is retired and does not need voational rehab at this time      Education: Education Goals: Education classes will be provided on a weekly basis, covering required topics. Participant will state understanding/return demonstration of topics presented.  Learning Barriers/Preferences: Learning Barriers/Preferences - 08/07/18 1122      Learning Barriers/Preferences   Learning Barriers  Sight    Learning Preferences  Individual Instruction;Video;Skilled Demonstration;Pictoral;Group Instruction       Education Topics: Count Your Pulse:  -Group instruction provided by verbal instruction, demonstration, patient participation and written materials to support subject.  Instructors address importance of being able to find your pulse and how  to count your pulse when at home without a heart monitor.  Patients get hands on experience counting their pulse with staff help and individually.   Heart Attack, Angina, and Risk Factor Modification:  -Group instruction provided by verbal instruction, video, and written materials to support subject.  Instructors address signs and symptoms of angina and heart attacks.    Also discuss risk factors for heart disease and how to make changes to improve heart health risk factors.   CARDIAC REHAB PHASE II EXERCISE from 09/10/2018 in [place]  Date  08/13/18  Instruction Review Code  2- Demonstrated Understanding      Functional Fitness:  -Group instruction provided by verbal instruction, demonstration, patient participation, and written materials to support subject.  Instructors address safety measures for doing things around the house.  Discuss how to get up and down off the floor, how to pick things up properly, how to safely get out of a chair without assistance, and balance training.   Meditation and Mindfulness:  -Group instruction provided by verbal instruction, patient participation, and written materials to support subject.  Instructor addresses importance of mindfulness and meditation practice to help reduce stress and improve awareness.  Instructor also leads participants through a meditation exercise.  Stretching for Flexibility and Mobility:  -Group instruction provided by verbal instruction, patient participation, and written materials to support subject.  Instructors lead participants through series of stretches that are designed to increase flexibility thus improving mobility.  These stretches are additional exercise for major muscle groups that are typically performed during regular warm up and cool down.   Hands Only CPR:  -Group verbal, video, and participation provides a basic overview of AHA guidelines for community CPR. Role-play of emergencies  allow participants the opportunity to practice calling for help and chest compression technique with discussion of AED use.   Hypertension: -Group verbal and written instruction that provides a basic overview of hypertension including the most recent diagnostic guidelines, risk factor reduction with self-care instructions and medication management.    Nutrition I class: Heart Healthy Eating:  -Group instruction provided by PowerPoint slides, verbal discussion, and written materials to support subject matter. The instructor gives an explanation and review of the Therapeutic Lifestyle Changes diet recommendations, which includes a discussion on lipid goals, dietary fat, sodium, fiber, plant stanol/sterol esters, sugar, and the components of a well-balanced, healthy diet.   Nutrition II class: Lifestyle Skills:  -Group instruction provided by PowerPoint slides, verbal discussion, and written materials to support subject matter. The instructor gives an explanation and review of label reading, grocery shopping for heart health, heart healthy recipe modifications, and ways to make healthier choices when eating out.   Diabetes Question & Answer:  -Group instruction provided by PowerPoint slides, verbal discussion, and written materials to support subject matter. The instructor gives an explanation and review of diabetes co-morbidities, pre- and post-prandial blood glucose goals, pre-exercise blood glucose goals, signs, symptoms, and treatment of hypoglycemia and hyperglycemia, and foot care basics.   Diabetes Blitz:  -Group instruction provided by PowerPoint slides, verbal discussion, and written materials to support subject matter. The instructor gives an explanation and review of the physiology behind type 1 and type 2 diabetes, diabetes medications and rational behind using different medications, pre- and post-prandial blood glucose recommendations and Hemoglobin A1c goals, diabetes diet, and  exercise including blood glucose guidelines for exercising safely.    Portion Distortion:  -Group instruction provided by PowerPoint slides, verbal discussion, written materials, and food models to support subject matter. The instructor gives an explanation of serving size versus portion size, changes in portions sizes over the last 20 years, and what consists of a serving from each food group.   Stress Management:  -Group instruction provided by verbal instruction, video, and written materials to support subject matter.  Instructors review role of stress in heart disease and how to cope with stress positively.     Exercising on Your Own:  -Group instruction provided by verbal instruction, power point, and written materials to support subject.  Instructors discuss benefits of exercise, components of exercise, frequency and intensity of exercise, and end points for exercise.  Also discuss use of nitroglycerin and activating EMS.  Review options of places to exercise outside of rehab.  Review guidelines for sex with heart disease.   CARDIAC REHAB PHASE II EXERCISE from 09/10/2018 in [place]  Date  08/20/18  Educator  EP  Instruction Review Code  2- Demonstrated Understanding      Cardiac Drugs I:  -Group instruction provided by verbal instruction and written materials to support subject.  Instructor reviews cardiac drug classes: antiplatelets, anticoagulants, beta blockers, and statins.  Instructor discusses reasons, side effects, and lifestyle considerations for each drug class.  CARDIAC REHAB PHASE II EXERCISE from 09/10/2018 in [place]  Date  09/10/18  Educator  Pharmacist  Instruction Review Code  2- Demonstrated Understanding      Cardiac Drugs II:  -Group instruction provided by verbal instruction and written materials to support subject.  Instructor reviews cardiac drug classes: angiotensin converting enzyme  inhibitors (ACE-I), angiotensin II receptor blockers (ARBs), nitrates, and calcium channel blockers.  Instructor discusses reasons, side effects, and lifestyle considerations for each drug class.   Anatomy and Physiology of the Circulatory System:  Group verbal and written instruction and models provide basic cardiac anatomy and physiology, with the coronary electrical and arterial systems. Review of: AMI, Angina, Valve disease, Heart Failure, Peripheral Artery Disease, Cardiac Arrhythmia, Pacemakers, and the ICD.   Other Education:  -Group or individual verbal, written, or video instructions that support the educational goals of the cardiac rehab program.   Holiday Eating Survival Tips:  -Group instruction provided by PowerPoint slides, verbal discussion, and written materials to support subject matter. The instructor gives patients tips, tricks, and techniques to help them not only survive but enjoy the holidays despite the onslaught of food that accompanies the holidays.   Knowledge Questionnaire Score: Knowledge Questionnaire Score - 08/07/18 1106      Knowledge Questionnaire Score   Pre Score  20/24       Core Components/Risk Factors/Patient Goals at Admission: Personal Goals and Risk Factors at Admission - 08/07/18 1218      Core Components/Risk Factors/Patient Goals on Admission    Weight Management  Yes;Weight Maintenance;Weight Loss    Intervention  Weight Management: Develop a combined nutrition and exercise program designed to reach desired caloric intake, while maintaining appropriate intake of nutrient and fiber, sodium and fats, and appropriate energy expenditure required for the weight goal.;Weight Management: Provide education and appropriate resources to help participant work on and attain dietary goals.;Weight Management/Obesity: Establish reasonable short term and long term weight goals.    Admit Weight  197 lb 8.5 oz (89.6 kg)    Expected Outcomes  Short Term:  Continue to assess and modify interventions until short term weight is achieved;Long Term: Adherence to nutrition and physical activity/exercise program aimed toward attainment of established weight goal;Weight Maintenance: Understanding of the daily nutrition guidelines, which includes 25-35% calories from fat, 7% or less cal from saturated fats, less than 268m cholesterol, less than 1.5gm of sodium, & 5 or more servings of fruits and vegetables daily;Weight Loss: Understanding of general recommendations for a balanced deficit meal plan, which promotes 1-2 lb weight loss per week and includes a negative energy balance of [phone] kcal/d;Understanding recommendations for meals to include 15-35% energy as protein, 25-35% energy from fat, 35-60% energy from carbohydrates, less than 2025mof dietary cholesterol, 20-35 gm of total fiber daily;Understanding of distribution of calorie intake throughout the day with the consumption of 4-5 meals/snacks    Diabetes  Yes    Intervention  Provide education about signs/symptoms and action to take for hypo/hyperglycemia.;Provide education about proper nutrition, including hydration, and aerobic/resistive exercise prescription along with prescribed medications to achieve blood glucose in normal ranges: Fasting glucose 65-99 mg/dL    Expected Outcomes  Short Term: Participant verbalizes understanding of the signs/symptoms and immediate care of hyper/hypoglycemia, proper foot care and importance of medication, aerobic/resistive exercise and nutrition plan for blood glucose control.;Long Term: Attainment of HbA1C < 7%.    Hypertension  Yes    Intervention  Provide education on lifestyle modifcations including regular  physical activity/exercise, weight management, moderate sodium restriction and increased consumption of fresh fruit, vegetables, and low fat dairy, alcohol moderation, and smoking cessation.;Monitor prescription use compliance.    Expected Outcomes  Short Term:  Continued assessment and intervention until BP is < 140/58m HG in hypertensive participants. < 130/871mHG in hypertensive participants with diabetes, heart failure or chronic kidney disease.;Long Term: Maintenance of blood pressure at goal levels.    Lipids  Yes    Intervention  Provide education and support for participant on nutrition & aerobic/resistive exercise along with prescribed medications to achieve LDL <7064mHDL >63m74m  Expected Outcomes  Short Term: Participant states understanding of desired cholesterol values and is compliant with medications prescribed. Participant is following exercise prescription and nutrition guidelines.;Long Term: Cholesterol controlled with medications as prescribed, with individualized exercise RX and with personalized nutrition plan. Value goals: LDL < 70mg8mL > 40 mg.    Stress  Yes    Intervention  Offer individual and/or small group education and counseling on adjustment to heart disease, stress management and health-related lifestyle change. Teach and support self-help strategies.;Refer participants experiencing significant psychosocial distress to appropriate mental health specialists for further evaluation and treatment. When possible, include family members and significant others in education/counseling sessions.    Expected Outcomes  Short Term: Participant demonstrates changes in health-related behavior, relaxation and other stress management skills, ability to obtain effective social support, and compliance with psychotropic medications if prescribed.;Long Term: Emotional wellbeing is indicated by absence of clinically significant psychosocial distress or social isolation.       Core Components/Risk Factors/Patient Goals Review:  Goals and Risk Factor Review    Row Name 08/13/18 1633 09/11/18 1359           Core Components/Risk Factors/Patient Goals Review   Personal Goals Review  Weight Management/Obesity;Hypertension;Diabetes  Weight  Management/Obesity;Hypertension;Diabetes      Review  DavidShanon Browted exercise at cardiac rehab today  DavidShanon Browbeen doing fair with exercise [name]'s vital signs have been stable. DavidShanon Brownot been able to increase his work loads due to his CAD      Expected Outcomes  DavidShanon Brow continue to participate in phase 2 cardiac rehab for exercise nutrtion and lifestyle modfications  DavidShanon Brow continue to participate in phase 2 cardiac rehab for exercise nutrtion and lifestyle modfications         Core Components/Risk Factors/Patient Goals at Discharge (Final Review):  Goals and Risk Factor Review - 09/11/18 1359      Core Components/Risk Factors/Patient Goals Review   Personal Goals Review  Weight Management/Obesity;Hypertension;Diabetes    Review  DavidShanon Browbeen doing fair with exercise [name]'s vital signs have been stable. DavidShanon Brownot been able to increase his work loads due to his CAD    Expected Outcomes  DavidShanon Brow continue to participate in phase 2 cardiac rehab for exercise nutrtion and lifestyle modfications       ITP Comments: ITP Comments    Row Name 08/06/18 1608 08/13/18 1631 09/11/18 1356       ITP Comments  Dr.Traci Turner, Medical Director   30 Day ITP Review. DavidShanon Browted cardiac rehab today  30 Day ITP Review. DavidShanon Browood attendance and participation in phase 2 cardiac rehab.        Comments: See ITP comments. Will fax exercise flow sheets to Dr HardiAllison Quarryce to review as DavidShanon Browa follow up appointment with Dr HardiEllyn Hackriday. MariaBarnet PallBSN 09/11/2018 2:07 PM

## 2018-09-12 ENCOUNTER — Encounter (HOSPITAL_COMMUNITY): Payer: Medicare Other

## 2018-09-12 ENCOUNTER — Ambulatory Visit (HOSPITAL_COMMUNITY): Payer: Medicare Other

## 2018-09-12 ENCOUNTER — Ambulatory Visit (INDEPENDENT_AMBULATORY_CARE_PROVIDER_SITE_OTHER): Payer: Medicare Other | Admitting: Cardiology

## 2018-09-12 ENCOUNTER — Encounter: Payer: Self-pay | Admitting: Cardiology

## 2018-09-12 VITALS — BP 140/68 | HR 69 | Ht 73.0 in | Wt 194.2 lb

## 2018-09-12 DIAGNOSIS — I209 Angina pectoris, unspecified: Secondary | ICD-10-CM | POA: Diagnosis not present

## 2018-09-12 DIAGNOSIS — Z01818 Encounter for other preprocedural examination: Secondary | ICD-10-CM

## 2018-09-12 DIAGNOSIS — I25719 Atherosclerosis of autologous vein coronary artery bypass graft(s) with unspecified angina pectoris: Secondary | ICD-10-CM

## 2018-09-12 DIAGNOSIS — I25119 Atherosclerotic heart disease of native coronary artery with unspecified angina pectoris: Secondary | ICD-10-CM

## 2018-09-12 DIAGNOSIS — E785 Hyperlipidemia, unspecified: Secondary | ICD-10-CM | POA: Diagnosis not present

## 2018-09-12 DIAGNOSIS — I10 Essential (primary) hypertension: Secondary | ICD-10-CM | POA: Diagnosis not present

## 2018-09-12 MED ORDER — METOPROLOL SUCCINATE ER 50 MG PO TB24: 50.0000 mg | ORAL_TABLET | Freq: Every day | ORAL | 6 refills | Status: DC

## 2018-09-12 NOTE — H&P (View-Only) (Signed)
PCP: Patient, No Pcp Per  Clinic Note: Chief Complaint  Patient presents with  . Follow-up    Continues to have exertional anginal symptoms.  . Coronary Artery Disease    Exertional chest burning better than before, still present with exertion.    HPI: Brandon Vasquez is a 73 y.o. male with a PMH of CAD-nSTEMI/Accelerated HTN -- MV CAD -CABG-PCI.  He returns for 1 month f/u.   He has a history of multivessel coronary artery disease status post CABG 4 (LIMA-LAD {could be to D1-L, SVG-RPDA, SVG-OM1-OM 2) in 2001 (in [place])--> after presenting with cardiac arrest and emergent catheterization  ? --> NSTEMI IN 04/2016 -> PCI to SVG-RCA, med Rx of Native Cx disease.  ? Myoview stress test November 2018: EF 44%.  Medium size moderate severity fixed defect in the basal inferolateral mid inferolateral wall consistent with prior infarct with mild peri-infarct ischemia.  LOW RISK. ? Progressive Angina - 05/08/2018: Cath - ISR SVG-RCA =-> DES PCI & PTCA; non-revascularized Native Cx - ostial 90% (mild progression)  He also has hypertension, hyperlipidemia and type 2 diabetes mellitus - Was converted from lisinopril to amlodipine plus HCTZ for hypotension/dizziness  Brandon Vasquez was last seen by Almyra Deforest, PA on 08/08/2018 - discussed results of Myoview (ordered after visit with me on 10/14/'19) --> increased Imdur & added Ranexa 500 mg BID>  Recent Hospitalizations: None  Studies Personally Reviewed - (if available, images/films reviewed: From Epic Chart or Care Everywhere)  Myoview 07/15/2018:  EF 45-50%. Small size fixed  inferolateral wall defect c/w prior MI. NO ischemia.  LOW RISK  Interval History: Brandon Vasquez returns today still noticing that he really is limited by exertional symptoms of chest burning.  He has not been able to get back into his routine cardiac rehab.  He is very limited.  He does state however that it is definitely better than before his last PCI, at that point he was not  able to do anything without noticing chest pain or pressure.  Now he is able to do routine activity, but with any type of exertion he notes exertional dyspnea and chest burning. He is not having any resting symptoms, but was not noted to have any improvement with increased Imdur.  He did not take the Ranexa because of concern for potential hepatic repercussions.  He denies any PND, orthopnea edema.  No rapid irregular heartbeats palpitations.  No syncope/near syncope or TIA/amaurosis fugax. No claudication.  ROS: A comprehensive was performed. Review of Systems  Constitutional: Negative for malaise/fatigue (Just not able to do anything because of the burning sensation in his chest) and weight loss.  HENT: Negative for congestion and nosebleeds.   Respiratory: Negative for cough and shortness of breath.   Gastrointestinal: Negative for blood in stool and melena.  Genitourinary: Negative for hematuria.  Musculoskeletal: Negative for joint pain.  Neurological: Negative for dizziness, focal weakness and weakness.  Endo/Heme/Allergies: Does not bruise/bleed easily.  Psychiatric/Behavioral: Negative.   All other systems reviewed and are negative.   I have reviewed and (if needed) personally updated the patient's problem list, medications, allergies, past medical and surgical history, social and family history.   Past Medical History:  Diagnosis Date  . Coronary artery disease involving native heart with angina pectoris [facility]) 2005   a. 2005: CABG x5V in [place]  b. 04/2016: PCI/DES to SVG--> RCA. Severe Ost LCx (with CTO of grafted OMs - no retrograde filling) - distal Cx unprotected; RCA & LAD essentially  CTO.;; 05/2018 - SVG-RCA ostial ISR with post-stent 80% - Overlapping DES & high Atm post-dilation of ISR. Progression of Native LM-ostLCx disease  . Essential hypertension   . HCV (hepatitis C virus)    "treated" (05/08/2018)  . HLD (hyperlipidemia)   . Hx of CABG 2001   [place] - CABG 4:  LIMA-LAD, SeqSVG-OM1-OM2,SVG-rPDA  . MI (myocardial infarction) ([place]) 2001  . Non-ST elevation MI (NSTEMI) ([place]) 04/2016   Culprit lesion was 90% SVG-RPDA -> DES PCI. Also noted 80% ostial native circumflex with distal circumflex potentially in jeopardy -> MEDICAL Management.  . Type 2 diabetes mellitus with complication (HCC)    CAD    Past Surgical History:  Procedure Laterality Date  . CARDIAC CATHETERIZATION N/A 04/23/2016   Procedure: Left Heart Cath and Cors/Grafts Angiography;  Surgeon: Jettie Booze, MD;  Location: [place] [facility];  Service: Cardiovascular: ostLAD 75%->pLAD 100%, ostOM1 & OM2 100%, pRCA 80% -> dRCA 50%; LIMA-LAD patent, SVG-OM1-OM2 ~20% prox otw patentt, ost-pSVG-rPDA 90% --> PCI  . CARDIAC CATHETERIZATION N/A 04/23/2016   Procedure: Coronary Stent Intervention;  Surgeon: Jettie Booze, MD;  Location: [place] [facility];  Service: Cardiovascular;  Laterality: N/A;  DES PCI - 90% pSVG- PDA (Synergy DES 3.5 x 20)  . CORONARY ARTERY BYPASS GRAFT  2001   LIMA-LAD, SeqSVG-OM1-OM2,SVG-rPDA  . CORONARY STENT INTERVENTION N/A 05/08/2018   Procedure: CORONARY STENT INTERVENTION;  Surgeon: Leonie Man, MD;  Location: [place] [facility];  Service: Cardiovascular;; Ost SVG-RPDA-PL 70% ISR & 75-80% just beyond stent --> Synergy DES 3.5 x 12 (overlapping) with 4.0 mm post-dilation of overlap & entire old stent (especially the [place])  . LAPAROSCOPIC CHOLECYSTECTOMY    . LEFT HEART CATH AND CORS/GRAFTS ANGIOGRAPHY N/A 05/08/2018   Procedure: LEFT HEART CATH AND CORS/GRAFTS ANGIOGRAPHY;  Surgeon: Leonie Man, MD;  Location: [place] [facility];  Service: Cardiovascular;; SVG-RPDA-PL 70% ostial ISR & 75% aftter stent; Progression of dLM-OstLCx to ~90-95%.-> PCI of SVG-RPDA-PL. RCA & LAD CTO.  Patent SeqSVG-OM1-OM2 (native OMs ostial CTO - no retrograde flow), Patent LIMA-mLAD  . NM MYOVIEW LTD  07/2017   EF 44%.  Medium-sized, moderate severity fixed defect in basal  inferolateral and mid inferolateral wall consistent with prior infarct and mild peri-infarct ischemia.  LOW RISK.  Marland Kitchen TRANSTHORACIC ECHOCARDIOGRAM  04/2016   EF 60-65%. GR 2 DD. Inferior hypokinesis. No significant valvular lesions    Cath-PC 05/2018:   In-stent restenosis followed by post-stent severe stenosis in the SVG-RPDA-RPL -> successful PTCA of in-stent restenosis with a second Synergy DES (3.5 mm x 12 mm) placed to cover the distal edge lesion.  Progression of proximal RCA, distal Left Main and ostial Circumflex disease.  Otherwise patent LIMA-LAD and SVG-OM1-OM 2.    Current Meds  Medication Sig  . amLODipine (NORVASC) 10 MG tablet Take 1 tablet (10 mg total) by mouth daily.  Marland Kitchen aspirin 81 MG EC tablet Take 81 mg by mouth daily. Swallow whole.  . ezetimibe (ZETIA) 10 MG tablet Take 1 tablet (10 mg total) by mouth daily.  . Hypromellose (ARTIFICIAL TEARS OP) Apply 1-2 drops to eye daily as needed (dry eyes).  . isosorbide mononitrate (IMDUR) 60 MG 24 hr tablet Take 1.5 tablets (90 mg total) by mouth daily.  . metFORMIN (GLUCOPHAGE-XR) 500 MG 24 hr tablet Take 500 mg by mouth daily.   . nitroGLYCERIN (NITROSTAT) 0.4 MG SL tablet Place 1 tablet (0.4 mg total) under the tongue every 5 (five) minutes  x 3 doses as needed for chest pain.  . ranolazine (RANEXA) 500 MG 12 hr tablet Take 1 tablet (500 mg total) by mouth 2 (two) times daily.  . ticagrelor (BRILINTA) 90 MG TABS tablet Take 1 tablet (90 mg total) by mouth 2 (two) times daily.  . [DISCONTINUED] metoprolol succinate (TOPROL XL) 25 MG 24 hr tablet Take 1 tablet (25 mg total) by mouth daily.    No Known Allergies  Social History   Tobacco Use  . Smoking status: Former Smoker    Packs/day: 1.00    Years: 30.00    Pack years: 30.00    Types: Cigarettes    Last attempt to quit: 07/10/1996    Years since quitting: 22.1  . Smokeless tobacco: Never Used  Substance Use Topics  . Alcohol use: Yes    Comment: 05/08/2018  "couple drinks/yyear"  . Drug use: Not Currently    Types: Marijuana    Comment: 05/08/2018 "nothing in 2019"   Social History   Social History Narrative  . Not on file    family history includes Alzheimer's disease in his father; Cerebral aneurysm in his mother; Hypertension in his mother.  Wt Readings from Last 3 Encounters:  09/12/18 194 lb 3.2 oz (88.1 kg)  08/07/18 197 lb 8.5 oz (89.6 kg)  07/15/18 190 lb (86.2 kg)    PHYSICAL EXAM BP 140/68   Pulse 69   Ht '6\' 1"'  (1.854 m)   Wt 194 lb 3.2 oz (88.1 kg)   SpO2 98%   BMI 25.62 kg/m  Physical Exam  Constitutional: He is oriented to person, place, and time. He appears well-developed and well-nourished. No distress.  Well-groomed.  Healthy-appearing.  HENT:  Head: Normocephalic and atraumatic.  Mouth/Throat: Oropharynx is clear and moist.  Eyes: Pupils are equal, round, and reactive to light. Conjunctivae and EOM are normal. No scleral icterus.  Neck: Normal range of motion. Neck supple. No hepatojugular reflux and no JVD present. Carotid bruit is not present. No tracheal deviation present. No thyromegaly present.  Cardiovascular: Normal rate, regular rhythm, normal heart sounds, intact distal pulses and normal pulses.  No extrasystoles are present. PMI is not displaced. Exam reveals no gallop and no friction rub.  No murmur heard. Pulmonary/Chest: Effort normal and breath sounds normal. No respiratory distress. He has no wheezes.  Abdominal: Soft. Bowel sounds are normal. He exhibits no distension. There is no abdominal tenderness. There is no rebound.  Musculoskeletal: Normal range of motion.        General: No edema.  Neurological: He is alert and oriented to person, place, and time. No cranial nerve deficit.  Psychiatric: He has a normal mood and affect. His behavior is normal. Judgment and thought content normal.  Vitals reviewed.    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  Improved, but not fully treated by last PCI.  The only real remaining vessel is the ostium of the circumflex which would be a left main into circumflex protected PCI.  I would also like to get a better look at the LIMA-LAD.  His stress test appeared to have some difference in location of the defect.  I clearly see the defect, but it does appear to have some reversibility in the inferolateral wall.  At this point I think we will proceed with PCI of the left main into circumflex.  We will review images with interventional colleagues to determine if I need to do atherectomy or not.  It does not appear to be that calcified.      Relevant Medications   metoprolol succinate (TOPROL-XL) 50 MG 24 hr tablet   Coronary artery disease involving autologous vein coronary bypass graft with angina pectoris (HCC) (Chronic)    Most recent cath reviewed.  Does have persistent anginal symptoms. Plan: Continue with 90 mg Imdur, restart Ranexa (I reassured him that he should be okay with concerns of liver. Continue with Crestor and Zetia. On aspirin and Brilinta.  Increase Toprol to 50 mg daily.      Relevant Medications   metoprolol succinate (TOPROL-XL) 50 MG 24 hr tablet   Other Relevant Orders   Basic metabolic panel (Completed)   CBC (Completed)   LEFT HEART CATHETERIZATION WITH CORONARY ANGIOGRAM   Coronary artery disease involving native heart with angina pectoris  (HCC) (Chronic)   Relevant Medications   metoprolol succinate (TOPROL-XL) 50 MG 24 hr tablet   Other Relevant Orders   Basic metabolic panel (Completed)   CBC (Completed)   LEFT HEART CATHETERIZATION WITH CORONARY ANGIOGRAM   Dyslipidemia, goal LDL below 70 (Chronic)    Most recent lipid showed LDL of 78.  Is on Crestor plus Zetia.  Would be due for follow-up after his cath.      Relevant Medications   metoprolol succinate (TOPROL-XL) 50 MG 24 hr tablet   Essential hypertension (Chronic)    Has plenty of blood pressure room.  Will increase Toprol to 50 mg daily. Depending on what his blood pressure looks like we may need to add ACE inhibitor or ARB.  He is only on beta-blocker and Norvasc.      Relevant Medications   metoprolol succinate (TOPROL-XL) 50 MG 24 hr tablet    Other Visit Diagnoses    Pre-op testing       Relevant Orders   Basic metabolic panel (Completed)   CBC (Completed)      I spent a total of 35-40 minutes with the patient and chart review. >  50% of the time was spent in direct patient consultation.   Current medicines are reviewed at length with the patient today.  (+/- concerns) n/a -did not take Ranexa because of concern about hepatic insufficiency. The following changes have been made:  Restart Ranexa; increased Toprol  Patient Instructions  Medication Instructions:  --- START TAKING RANEXA 500 MG TWICE A DAY- AS PRESCRIBED EARLIER  --- INCREASE TOPROL  XL ( METOPROLOL SUCCINATE )  50 MG ONE TABLET DAILY  If you need a refill on your cardiac medications before your next appointment, please call your pharmacy.   Lab work: Clinical research associate labs CBC BMP today  Testing/Procedures: [place] [address] ,2020 Your physician has requested that you have a cardiac catheterization. Cardiac catheterization is used to diagnose and/or treat various heart conditions.  Follow-Up:  . Your physician recommends that you schedule a follow-up  appointment in [place]   Studies Ordered:   Orders Placed This Encounter  Procedures  . Basic metabolic panel  . CBC  . LEFT HEART CATHETERIZATION WITH CORONARY Illene Silver, M.D., M.S. Interventional Cardiologist   Pager # [phone] Phone # [phone] [address] [place], [place] [address]   Thank you for choosing Heartcare at [facility]!!

## 2018-09-12 NOTE — Patient Instructions (Addendum)
Medication Instructions:  --- START TAKING RANEXA 500 MG TWICE A DAY- AS PRESCRIBED EARLIER   --- INCREASE TOPROL  XL ( METOPROLOL SUCCINATE )  50 MG ONE TABLET DAILY   If you need a refill on your cardiac medications before your next appointment, please call your pharmacy.   Lab work: CBC BMP today If you have labs (blood work) drawn today and your tests are completely normal, you will receive your results only by: Marland Kitchen MyChart Message (if you have MyChart) OR . A paper copy in the mail If you have any lab test that is abnormal or we need to change your treatment, we will call you to review the results.  Testing/Procedures: SCHEDULE AT [place] 16 ,2020 Your physician has requested that you have a cardiac catheterization. Cardiac catheterization is used to diagnose and/or treat various heart conditions. Doctors may recommend this procedure for a number of different reasons. The most common reason is to evaluate chest pain. Chest pain can be a symptom of coronary artery disease (CAD), and cardiac catheterization can show whether plaque is narrowing or blocking your heart's arteries. This procedure is also used to evaluate the valves, as well as measure the blood flow and oxygen levels in different parts of your heart. For further information please visit [email or web address][facility][email or web address]. Please follow instruction sheet, as given.    Follow-Up: At [facility], you and your health needs are our priority.  As part of our continuing mission to provide you with exceptional heart care, we have created designated Provider Care Teams.  These Care Teams include your primary Cardiologist (physician) and Advanced Practice Providers (APPs -  Physician Assistants and Nurse Practitioners) who all work together to provide you with the care you need, when you need it. . Your physician recommends that you schedule a follow-up appointment in [place] .   Any Other Special Instructions  Will Be Listed Below (If Applicable).      [place] [address] [place] [address] Dept: [phone] Loc: [place]  09/12/2018  You are scheduled for a Cardiac Catheterization on Thursday, January 16 with Dr. [name].  1. Please arrive at the [facility] (Main Entrance A) at [facility]: [address] [place], [place] [address] at 7:00 AM (This time is two hours before your procedure to ensure your preparation). Free valet parking service is available.   Special note: Every effort is made to have your procedure done on time. Please understand that emergencies sometimes delay scheduled procedures.  2. Diet: Do not eat solid foods after midnight.  The patient may have clear liquids until 5am upon the day of the procedure.  3. Labs: You will need to have blood drawn  TODAY. You do not need to be fasting.  4. Medication instructions in preparation for your procedure:  DO NOT TAKE METFORIMN 500 MG  THE DAY OF PROCEDURE AND 48 HOURS AFTERWARDS  On the morning of your procedure, take your Aspirin 81 MG and Brilinta/Ticagrelor 90 MG  and any morning medicines NOT listed above.  You may use sips of water.  5. Plan for one night stay--bring personal belongings. 6. Bring a current list of your medications and current insurance cards. 7. You MUST have a responsible person to drive you home. 8. Someone MUST be with you the first 24 hours after you arrive home or your discharge will  be delayed. 9. Please wear clothes that are easy to get on and off and wear slip-on shoes.  Thank you for allowing [place] to care for you!   -- [place] [facility]

## 2018-09-12 NOTE — Progress Notes (Signed)
PCP: Patient, No Pcp Per  Clinic Note: Chief Complaint  Patient presents with  . Follow-up    Continues to have exertional anginal symptoms.  . Coronary Artery Disease    Exertional chest burning better than before, still present with exertion.    HPI: Brandon Vasquez is a 73 y.o. male with a PMH of CAD-nSTEMI/Accelerated HTN -- MV CAD -CABG-PCI.  He returns for 1 month f/u.   He has a history of multivessel coronary artery disease status post CABG 4 (LIMA-LAD {could be to D1-L, SVG-RPDA, SVG-OM1-OM 2) in 2001 (in [place])--> after presenting with cardiac arrest and emergent catheterization  ? --> NSTEMI IN 04/2016 -> PCI to SVG-RCA, med Rx of Native Cx disease.  ? Myoview stress test November 2018: EF 44%.  Medium size moderate severity fixed defect in the basal inferolateral mid inferolateral wall consistent with prior infarct with mild peri-infarct ischemia.  LOW RISK. ? Progressive Angina - 05/08/2018: Cath - ISR SVG-RCA =-> DES PCI & PTCA; non-revascularized Native Cx - ostial 90% (mild progression)  He also has hypertension, hyperlipidemia and type 2 diabetes mellitus - Was converted from lisinopril to amlodipine plus HCTZ for hypotension/dizziness  [name] was last seen by Almyra Deforest, PA on 08/08/2018 - discussed results of Myoview (ordered after visit with me on 10/14/'19) --> increased Imdur & added Ranexa 500 mg BID>  Recent Hospitalizations: None  Studies Personally Reviewed - (if available, images/films reviewed: From Epic Chart or Care Everywhere)  Myoview 07/15/2018:  EF 45-50%. Small size fixed  inferolateral wall defect c/w prior MI. NO ischemia.  LOW RISK  Interval History: Brandon Vasquez returns today still noticing that he really is limited by exertional symptoms of chest burning.  He has not been able to get back into his routine cardiac rehab.  He is very limited.  He does state however that it is definitely better than before his last PCI, at that point he was not  able to do anything without noticing chest pain or pressure.  Now he is able to do routine activity, but with any type of exertion he notes exertional dyspnea and chest burning. He is not having any resting symptoms, but was not noted to have any improvement with increased Imdur.  He did not take the Ranexa because of concern for potential hepatic repercussions.  He denies any PND, orthopnea edema.  No rapid irregular heartbeats palpitations.  No syncope/near syncope or TIA/amaurosis fugax. No claudication.  ROS: A comprehensive was performed. Review of Systems  Constitutional: Negative for malaise/fatigue (Just not able to do anything because of the burning sensation in his chest) and weight loss.  HENT: Negative for congestion and nosebleeds.   Respiratory: Negative for cough and shortness of breath.   Gastrointestinal: Negative for blood in stool and melena.  Genitourinary: Negative for hematuria.  Musculoskeletal: Negative for joint pain.  Neurological: Negative for dizziness, focal weakness and weakness.  Endo/Heme/Allergies: Does not bruise/bleed easily.  Psychiatric/Behavioral: Negative.   All other systems reviewed and are negative.   I have reviewed and (if needed) personally updated the patient's problem list, medications, allergies, past medical and surgical history, social and family history.   Past Medical History:  Diagnosis Date  . Coronary artery disease involving native heart with angina pectoris [facility]) 2005   a. 2005: CABG x5V in [place]  b. 04/2016: PCI/DES to SVG--> RCA. Severe Ost LCx (with CTO of grafted OMs - no retrograde filling) - distal Cx unprotected; RCA & LAD essentially  CTO.;; 05/2018 - SVG-RCA ostial ISR with post-stent 80% - Overlapping DES & high Atm post-dilation of ISR. Progression of Native LM-ostLCx disease  . Essential hypertension   . HCV (hepatitis C virus)    "treated" (05/08/2018)  . HLD (hyperlipidemia)   . Hx of CABG 2001   [place] - CABG 4:  LIMA-LAD, SeqSVG-OM1-OM2,SVG-rPDA  . MI (myocardial infarction) ([place]) 2001  . Non-ST elevation MI (NSTEMI) ([place]) 04/2016   Culprit lesion was 90% SVG-RPDA -> DES PCI. Also noted 80% ostial native circumflex with distal circumflex potentially in jeopardy -> MEDICAL Management.  . Type 2 diabetes mellitus with complication (HCC)    CAD    Past Surgical History:  Procedure Laterality Date  . CARDIAC CATHETERIZATION N/A 04/23/2016   Procedure: Left Heart Cath and Cors/Grafts Angiography;  Surgeon: Jettie Booze, MD;  Location: [place] [facility];  Service: Cardiovascular: ostLAD 75%->pLAD 100%, ostOM1 & OM2 100%, pRCA 80% -> dRCA 50%; LIMA-LAD patent, SVG-OM1-OM2 ~20% prox otw patentt, ost-pSVG-rPDA 90% --> PCI  . CARDIAC CATHETERIZATION N/A 04/23/2016   Procedure: Coronary Stent Intervention;  Surgeon: Jettie Booze, MD;  Location: [place] [facility];  Service: Cardiovascular;  Laterality: N/A;  DES PCI - 90% pSVG- PDA (Synergy DES 3.5 x 20)  . CORONARY ARTERY BYPASS GRAFT  2001   LIMA-LAD, SeqSVG-OM1-OM2,SVG-rPDA  . CORONARY STENT INTERVENTION N/A 05/08/2018   Procedure: CORONARY STENT INTERVENTION;  Surgeon: Leonie Man, MD;  Location: [place] [facility];  Service: Cardiovascular;; Ost SVG-RPDA-PL 70% ISR & 75-80% just beyond stent --> Synergy DES 3.5 x 12 (overlapping) with 4.0 mm post-dilation of overlap & entire old stent (especially the [place])  . LAPAROSCOPIC CHOLECYSTECTOMY    . LEFT HEART CATH AND CORS/GRAFTS ANGIOGRAPHY N/A 05/08/2018   Procedure: LEFT HEART CATH AND CORS/GRAFTS ANGIOGRAPHY;  Surgeon: Leonie Man, MD;  Location: [place] [facility];  Service: Cardiovascular;; SVG-RPDA-PL 70% ostial ISR & 75% aftter stent; Progression of dLM-OstLCx to ~90-95%.-> PCI of SVG-RPDA-PL. RCA & LAD CTO.  Patent SeqSVG-OM1-OM2 (native OMs ostial CTO - no retrograde flow), Patent LIMA-mLAD  . NM MYOVIEW LTD  07/2017   EF 44%.  Medium-sized, moderate severity fixed defect in basal  inferolateral and mid inferolateral wall consistent with prior infarct and mild peri-infarct ischemia.  LOW RISK.  Marland Kitchen TRANSTHORACIC ECHOCARDIOGRAM  04/2016   EF 60-65%. GR 2 DD. Inferior hypokinesis. No significant valvular lesions    Cath-PC 05/2018:   In-stent restenosis followed by post-stent severe stenosis in the SVG-RPDA-RPL -> successful PTCA of in-stent restenosis with a second Synergy DES (3.5 mm x 12 mm) placed to cover the distal edge lesion.  Progression of proximal RCA, distal Left Main and ostial Circumflex disease.  Otherwise patent LIMA-LAD and SVG-OM1-OM 2.    Current Meds  Medication Sig  . amLODipine (NORVASC) 10 MG tablet Take 1 tablet (10 mg total) by mouth daily.  Marland Kitchen aspirin 81 MG EC tablet Take 81 mg by mouth daily. Swallow whole.  . ezetimibe (ZETIA) 10 MG tablet Take 1 tablet (10 mg total) by mouth daily.  . Hypromellose (ARTIFICIAL TEARS OP) Apply 1-2 drops to eye daily as needed (dry eyes).  . isosorbide mononitrate (IMDUR) 60 MG 24 hr tablet Take 1.5 tablets (90 mg total) by mouth daily.  . metFORMIN (GLUCOPHAGE-XR) 500 MG 24 hr tablet Take 500 mg by mouth daily.   . nitroGLYCERIN (NITROSTAT) 0.4 MG SL tablet Place 1 tablet (0.4 mg total) under the tongue every 5 (five) minutes  x 3 doses as needed for chest pain.  . ranolazine (RANEXA) 500 MG 12 hr tablet Take 1 tablet (500 mg total) by mouth 2 (two) times daily.  . ticagrelor (BRILINTA) 90 MG TABS tablet Take 1 tablet (90 mg total) by mouth 2 (two) times daily.  . [DISCONTINUED] metoprolol succinate (TOPROL XL) 25 MG 24 hr tablet Take 1 tablet (25 mg total) by mouth daily.    No Known Allergies  Social History   Tobacco Use  . Smoking status: Former Smoker    Packs/day: 1.00    Years: 30.00    Pack years: 30.00    Types: Cigarettes    Last attempt to quit: 07/10/1996    Years since quitting: 22.1  . Smokeless tobacco: Never Used  Substance Use Topics  . Alcohol use: Yes    Comment: 05/08/2018  "couple drinks/yyear"  . Drug use: Not Currently    Types: Marijuana    Comment: 05/08/2018 "nothing in 2019"   Social History   Social History Narrative  . Not on file    family history includes Alzheimer's disease in his father; Cerebral aneurysm in his mother; Hypertension in his mother.  Wt Readings from Last 3 Encounters:  09/12/18 194 lb 3.2 oz (88.1 kg)  08/07/18 197 lb 8.5 oz (89.6 kg)  07/15/18 190 lb (86.2 kg)    PHYSICAL EXAM BP 140/68   Pulse 69   Ht '6\' 1"'  (1.854 m)   Wt 194 lb 3.2 oz (88.1 kg)   SpO2 98%   BMI 25.62 kg/m  Physical Exam  Constitutional: He is oriented to person, place, and time. He appears well-developed and well-nourished. No distress.  Well-groomed.  Healthy-appearing.  HENT:  Head: Normocephalic and atraumatic.  Mouth/Throat: Oropharynx is clear and moist.  Eyes: Pupils are equal, round, and reactive to light. Conjunctivae and EOM are normal. No scleral icterus.  Neck: Normal range of motion. Neck supple. No hepatojugular reflux and no JVD present. Carotid bruit is not present. No tracheal deviation present. No thyromegaly present.  Cardiovascular: Normal rate, regular rhythm, normal heart sounds, intact distal pulses and normal pulses.  No extrasystoles are present. PMI is not displaced. Exam reveals no gallop and no friction rub.  No murmur heard. Pulmonary/Chest: Effort normal and breath sounds normal. No respiratory distress. He has no wheezes.  Abdominal: Soft. Bowel sounds are normal. He exhibits no distension. There is no abdominal tenderness. There is no rebound.  Musculoskeletal: Normal range of motion.        General: No edema.  Neurological: He is alert and oriented to person, place, and time. No cranial nerve deficit.  Psychiatric: He has a normal mood and affect. His behavior is normal. Judgment and thought content normal.  Vitals reviewed.    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  Improved, but not fully treated by last PCI.  The only real remaining vessel is the ostium of the circumflex which would be a left main into circumflex protected PCI.  I would also like to get a better look at the LIMA-LAD.  His stress test appeared to have some difference in location of the defect.  I clearly see the defect, but it does appear to have some reversibility in the inferolateral wall.  At this point I think we will proceed with PCI of the left main into circumflex.  We will review images with interventional colleagues to determine if I need to do atherectomy or not.  It does not appear to be that calcified.      Relevant Medications   metoprolol succinate (TOPROL-XL) 50 MG 24 hr tablet   Coronary artery disease involving autologous vein coronary bypass graft with angina pectoris (HCC) (Chronic)    Most recent cath reviewed.  Does have persistent anginal symptoms. Plan: Continue with 90 mg Imdur, restart Ranexa (I reassured him that he should be okay with concerns of liver. Continue with Crestor and Zetia. On aspirin and Brilinta.  Increase Toprol to 50 mg daily.      Relevant Medications   metoprolol succinate (TOPROL-XL) 50 MG 24 hr tablet   Other Relevant Orders   Basic metabolic panel (Completed)   CBC (Completed)   LEFT HEART CATHETERIZATION WITH CORONARY ANGIOGRAM   Coronary artery disease involving native heart with angina pectoris  (HCC) (Chronic)   Relevant Medications   metoprolol succinate (TOPROL-XL) 50 MG 24 hr tablet   Other Relevant Orders   Basic metabolic panel (Completed)   CBC (Completed)   LEFT HEART CATHETERIZATION WITH CORONARY ANGIOGRAM   Dyslipidemia, goal LDL below 70 (Chronic)    Most recent lipid showed LDL of 78.  Is on Crestor plus Zetia.  Would be due for follow-up after his cath.      Relevant Medications   metoprolol succinate (TOPROL-XL) 50 MG 24 hr tablet   Essential hypertension (Chronic)    Has plenty of blood pressure room.  Will increase Toprol to 50 mg daily. Depending on what his blood pressure looks like we may need to add ACE inhibitor or ARB.  He is only on beta-blocker and Norvasc.      Relevant Medications   metoprolol succinate (TOPROL-XL) 50 MG 24 hr tablet    Other Visit Diagnoses    Pre-op testing       Relevant Orders   Basic metabolic panel (Completed)   CBC (Completed)      I spent a total of 35-40 minutes with the patient and chart review. >  50% of the time was spent in direct patient consultation.   Current medicines are reviewed at length with the patient today.  (+/- concerns) n/a -did not take Ranexa because of concern about hepatic insufficiency. The following changes have been made:  Restart Ranexa; increased Toprol  Patient Instructions  Medication Instructions:  --- START TAKING RANEXA 500 MG TWICE A DAY- AS PRESCRIBED EARLIER  --- INCREASE TOPROL  XL ( METOPROLOL SUCCINATE )  50 MG ONE TABLET DAILY  If you need a refill on your cardiac medications before your next appointment, please call your pharmacy.   Lab work: Clinical research associate labs CBC BMP today  Testing/Procedures: [place] 16 ,2020 Your physician has requested that you have a cardiac catheterization. Cardiac catheterization is used to diagnose and/or treat various heart conditions.  Follow-Up:  . Your physician recommends that you schedule a follow-up  appointment in [place]   Studies Ordered:   Orders Placed This Encounter  Procedures  . Basic metabolic panel  . CBC  . LEFT HEART CATHETERIZATION WITH CORONARY Illene Silver, M.D., M.S. Interventional Cardiologist   Pager # [phone] Phone # [phone] [address] [place], [place] [address]   Thank you for choosing Heartcare at [facility]!!

## 2018-09-13 LAB — CBC
HEMOGLOBIN: 10.5 g/dL — AB (ref 13.0–17.7)
Hematocrit: 35.8 % — ABNORMAL LOW (ref 37.5–51.0)
MCH: 20.9 pg — AB (ref 26.6–33.0)
MCHC: 29.3 g/dL — AB (ref 31.5–35.7)
MCV: 71 fL — AB (ref 79–97)
Platelets: 290 10*3/uL (ref 150–450)
RBC: 5.03 x10E6/uL (ref 4.14–5.80)
RDW: 17.7 % — ABNORMAL HIGH (ref 11.6–15.4)
WBC: 4.5 10*3/uL (ref 3.4–10.8)

## 2018-09-13 LAB — BASIC METABOLIC PANEL
BUN/Creatinine Ratio: 15 (ref 10–24)
BUN: 19 mg/dL (ref 8–27)
CALCIUM: 9.8 mg/dL (ref 8.6–10.2)
CO2: 22 mmol/L (ref 20–29)
CREATININE: 1.24 mg/dL (ref 0.76–1.27)
Chloride: 104 mmol/L (ref 96–106)
GFR, EST AFRICAN AMERICAN: 67 mL/min/{1.73_m2} (ref >59)
GFR, EST NON AFRICAN AMERICAN: 58 mL/min/{1.73_m2} — AB (ref >59)
Glucose: 89 mg/dL (ref 65–99)
POTASSIUM: 4.7 mmol/L (ref 3.5–5.2)
SODIUM: 141 mmol/L (ref 134–144)

## 2018-09-14 ENCOUNTER — Encounter: Payer: Self-pay | Admitting: Cardiology

## 2018-09-14 NOTE — Assessment & Plan Note (Signed)
Has plenty of blood pressure room.  Will increase Toprol to 50 mg daily. Depending on what his blood pressure looks like we may need to add ACE inhibitor or ARB.  He is only on beta-blocker and Norvasc.

## 2018-09-14 NOTE — Assessment & Plan Note (Signed)
Most recent cath reviewed.  Does have persistent anginal symptoms. Plan: Continue with 90 mg Imdur, restart Ranexa (I reassured him that he should be okay with concerns of liver. Continue with Crestor and Zetia. On aspirin and Brilinta.  Increase Toprol to 50 mg daily.

## 2018-09-14 NOTE — Assessment & Plan Note (Signed)
Most recent lipid showed LDL of 78.  Is on Crestor plus Zetia.  Would be due for follow-up after his cath.

## 2018-09-14 NOTE — Assessment & Plan Note (Signed)
Pretty much now having class II if not 3 anginal symptoms.  Improved, but not fully treated by last PCI.  The only real remaining vessel is the ostium of the circumflex which would be a left main into circumflex protected PCI.  I would also like to get a better look at the LIMA-LAD.  His stress test appeared to have some difference in location of the defect.  I clearly see the defect, but it does appear to have some reversibility in the inferolateral wall.  At this point I think we will proceed with PCI of the left main into circumflex.  We will review images with interventional colleagues to determine if I need to do atherectomy or not.  It does not appear to be that calcified.

## 2018-09-15 ENCOUNTER — Ambulatory Visit (HOSPITAL_COMMUNITY): Payer: Medicare Other

## 2018-09-15 ENCOUNTER — Encounter (HOSPITAL_COMMUNITY): Payer: Medicare Other

## 2018-09-16 ENCOUNTER — Telehealth (HOSPITAL_COMMUNITY): Payer: Self-pay | Admitting: *Deleted

## 2018-09-16 ENCOUNTER — Telehealth: Payer: Self-pay | Admitting: *Deleted

## 2018-09-16 NOTE — Telephone Encounter (Signed)
No answer

## 2018-09-16 NOTE — Telephone Encounter (Signed)
Pt contacted pre-coronary stent intervention scheduled at [facility] for: Thursday September 18, 2018 9 AM Verified arrival time and place: [place] Entrance A at: 7 AM  No solid food after midnight prior to cath, clear liquids until 5 AM day of procedure. Contrast allergy: no  Hold: Metformin-day of procedure and 48 hours post procedure.  Except hold medication AM meds can be  taken pre-cath with sip of water including: ASA 81 mg Brilinta 90 mg  Confirm patient has responsible person to drive home post procedure and for 24 hours after you arrive home.  LMTCB to review instructions

## 2018-09-17 ENCOUNTER — Encounter (HOSPITAL_COMMUNITY): Payer: Medicare Other

## 2018-09-17 ENCOUNTER — Ambulatory Visit (HOSPITAL_COMMUNITY): Payer: Medicare Other

## 2018-09-17 NOTE — Telephone Encounter (Signed)
I reviewed instructions with patient, he verbalized understanding,thanked me for call. 

## 2018-09-18 ENCOUNTER — Ambulatory Visit (HOSPITAL_COMMUNITY)
Admission: RE | Admit: 2018-09-18 | Discharge: 2018-09-19 | Disposition: A | Discharge status: Home / Self Care | Payer: Medicare Other | Attending: Cardiology | Admitting: Cardiology

## 2018-09-18 ENCOUNTER — Other Ambulatory Visit: Payer: Self-pay

## 2018-09-18 ENCOUNTER — Encounter (HOSPITAL_COMMUNITY)
Admission: RE | Disposition: A | Discharge status: Home / Self Care | Payer: Self-pay | Source: Home / Self Care | Attending: Cardiology

## 2018-09-18 ENCOUNTER — Encounter (HOSPITAL_COMMUNITY): Payer: Self-pay | Admitting: *Deleted

## 2018-09-18 DIAGNOSIS — Z7982 Long term (current) use of aspirin: Secondary | ICD-10-CM | POA: Diagnosis not present

## 2018-09-18 DIAGNOSIS — Z8249 Family history of ischemic heart disease and other diseases of the circulatory system: Secondary | ICD-10-CM | POA: Insufficient documentation

## 2018-09-18 DIAGNOSIS — I25118 Atherosclerotic heart disease of native coronary artery with other forms of angina pectoris: Secondary | ICD-10-CM | POA: Diagnosis not present

## 2018-09-18 DIAGNOSIS — E119 Type 2 diabetes mellitus without complications: Secondary | ICD-10-CM | POA: Diagnosis not present

## 2018-09-18 DIAGNOSIS — Z01818 Encounter for other preprocedural examination: Secondary | ICD-10-CM

## 2018-09-18 DIAGNOSIS — Z794 Long term (current) use of insulin: Secondary | ICD-10-CM | POA: Diagnosis not present

## 2018-09-18 DIAGNOSIS — Z951 Presence of aortocoronary bypass graft: Secondary | ICD-10-CM | POA: Diagnosis not present

## 2018-09-18 DIAGNOSIS — Z8674 Personal history of sudden cardiac arrest: Secondary | ICD-10-CM | POA: Insufficient documentation

## 2018-09-18 DIAGNOSIS — Z79899 Other long term (current) drug therapy: Secondary | ICD-10-CM | POA: Diagnosis not present

## 2018-09-18 DIAGNOSIS — I252 Old myocardial infarction: Secondary | ICD-10-CM | POA: Insufficient documentation

## 2018-09-18 DIAGNOSIS — E1169 Type 2 diabetes mellitus with other specified complication: Secondary | ICD-10-CM | POA: Diagnosis present

## 2018-09-18 DIAGNOSIS — Z7984 Long term (current) use of oral hypoglycemic drugs: Secondary | ICD-10-CM | POA: Diagnosis not present

## 2018-09-18 DIAGNOSIS — I10 Essential (primary) hypertension: Secondary | ICD-10-CM | POA: Diagnosis present

## 2018-09-18 DIAGNOSIS — Z95820 Peripheral vascular angioplasty status with implants and grafts: Secondary | ICD-10-CM

## 2018-09-18 DIAGNOSIS — I25810 Atherosclerosis of coronary artery bypass graft(s) without angina pectoris: Secondary | ICD-10-CM | POA: Diagnosis present

## 2018-09-18 DIAGNOSIS — I209 Angina pectoris, unspecified: Secondary | ICD-10-CM | POA: Diagnosis present

## 2018-09-18 DIAGNOSIS — I25719 Atherosclerosis of autologous vein coronary artery bypass graft(s) with unspecified angina pectoris: Secondary | ICD-10-CM

## 2018-09-18 DIAGNOSIS — E785 Hyperlipidemia, unspecified: Secondary | ICD-10-CM | POA: Diagnosis present

## 2018-09-18 DIAGNOSIS — I2510 Atherosclerotic heart disease of native coronary artery without angina pectoris: Secondary | ICD-10-CM | POA: Diagnosis present

## 2018-09-18 DIAGNOSIS — Z955 Presence of coronary angioplasty implant and graft: Secondary | ICD-10-CM | POA: Diagnosis not present

## 2018-09-18 DIAGNOSIS — Z87891 Personal history of nicotine dependence: Secondary | ICD-10-CM | POA: Diagnosis not present

## 2018-09-18 DIAGNOSIS — I25119 Atherosclerotic heart disease of native coronary artery with unspecified angina pectoris: Secondary | ICD-10-CM | POA: Diagnosis present

## 2018-09-18 HISTORY — PX: CORONARY ATHERECTOMY: CATH118238

## 2018-09-18 HISTORY — PX: CORONARY STENT INTERVENTION: CATH118234

## 2018-09-18 HISTORY — PX: INTRAVASCULAR ULTRASOUND/IVUS: CATH118244

## 2018-09-18 LAB — POCT ACTIVATED CLOTTING TIME
Activated Clotting Time: 307 seconds
Activated Clotting Time: 340 seconds
Activated Clotting Time: 219 seconds
Activated Clotting Time: 329 seconds

## 2018-09-18 LAB — GLUCOSE, CAPILLARY
GLUCOSE-CAPILLARY: 93 mg/dL (ref 70–99)
Glucose-Capillary: 104 mg/dL — ABNORMAL HIGH (ref 70–99)
Glucose-Capillary: 109 mg/dL — ABNORMAL HIGH (ref 70–99)
Glucose-Capillary: 88 mg/dL (ref 70–99)

## 2018-09-18 LAB — MRSA PCR SCREENING: MRSA by PCR: POSITIVE — AB

## 2018-09-18 SURGERY — CORONARY STENT INTERVENTION
Anesthesia: LOCAL

## 2018-09-18 MED ORDER — SODIUM CHLORIDE 0.9% FLUSH: 3.0000 mL | INTRAVENOUS | Status: DC | PRN

## 2018-09-18 MED ORDER — VERAPAMIL HCL 2.5 MG/ML IV SOLN
INTRAVENOUS | Status: AC
  Filled 2018-09-18: qty 2

## 2018-09-18 MED ORDER — HYDRALAZINE HCL 20 MG/ML IJ SOLN: 5.0000 mg | INTRAMUSCULAR | Status: AC | PRN

## 2018-09-18 MED ORDER — LIDOCAINE HCL (PF) 1 % IJ SOLN
INTRAMUSCULAR | Status: DC | PRN
  Administered 2018-09-18: 15 mL via INTRADERMAL

## 2018-09-18 MED ORDER — ONDANSETRON HCL 4 MG/2ML IJ SOLN: 4.0000 mg | Freq: Four times a day (QID) | INTRAMUSCULAR | Status: DC | PRN

## 2018-09-18 MED ORDER — HEPARIN (PORCINE) IN NACL 1000-0.9 UT/500ML-% IV SOLN
INTRAVENOUS | Status: AC
  Filled 2018-09-18: qty 1000

## 2018-09-18 MED ORDER — LOSARTAN POTASSIUM 25 MG PO TABS
25.0000 mg | ORAL_TABLET | Freq: Every day | ORAL | Status: DC
  Administered 2018-09-18: 25 mg via ORAL
  Filled 2018-09-18 (×2): qty 1

## 2018-09-18 MED ORDER — VIPERSLIDE LUBRICANT OPTIME
TOPICAL | Status: DC | PRN
  Administered 2018-09-18: 11:00:00 via SURGICAL_CAVITY

## 2018-09-18 MED ORDER — LIDOCAINE HCL (PF) 1 % IJ SOLN
INTRAMUSCULAR | Status: AC
  Filled 2018-09-18: qty 30

## 2018-09-18 MED ORDER — HEPARIN (PORCINE) 25000 UT/250ML-% IV SOLN
1050.0000 [IU]/h | INTRAVENOUS | Status: AC
  Administered 2018-09-18: 1050 [IU]/h via INTRAVENOUS
  Filled 2018-09-18: qty 250

## 2018-09-18 MED ORDER — ASPIRIN EC 81 MG PO TBEC
81.0000 mg | DELAYED_RELEASE_TABLET | Freq: Every day | ORAL | Status: DC
  Administered 2018-09-19: 12:00:00 81 mg via ORAL
  Filled 2018-09-18: qty 1

## 2018-09-18 MED ORDER — SODIUM CHLORIDE 0.9% FLUSH
3.0000 mL | Freq: Two times a day (BID) | INTRAVENOUS | Status: DC
  Administered 2018-09-18 (×2): 3 mL via INTRAVENOUS

## 2018-09-18 MED ORDER — FENTANYL CITRATE (PF) 100 MCG/2ML IJ SOLN
INTRAMUSCULAR | Status: DC | PRN
  Administered 2018-09-18 (×4): 25 ug via INTRAVENOUS

## 2018-09-18 MED ORDER — RANOLAZINE ER 500 MG PO TB12
500.0000 mg | ORAL_TABLET | Freq: Two times a day (BID) | ORAL | Status: DC
  Administered 2018-09-18 – 2018-09-19 (×3): 500 mg via ORAL
  Filled 2018-09-18 (×3): qty 1

## 2018-09-18 MED ORDER — SODIUM CHLORIDE 0.9 % IV SOLN
INTRAVENOUS | Status: AC
  Administered 2018-09-18: 13:00:00 via INTRAVENOUS

## 2018-09-18 MED ORDER — MUPIROCIN 2 % EX OINT
1.0000 "application " | TOPICAL_OINTMENT | Freq: Two times a day (BID) | CUTANEOUS | Status: DC
  Administered 2018-09-18 – 2018-09-19 (×2): 1 via NASAL
  Filled 2018-09-18: qty 22

## 2018-09-18 MED ORDER — NITROGLYCERIN 0.4 MG SL SUBL: 0.4000 mg | SUBLINGUAL_TABLET | SUBLINGUAL | Status: DC | PRN

## 2018-09-18 MED ORDER — ISOSORBIDE MONONITRATE ER 60 MG PO TB24
60.0000 mg | ORAL_TABLET | Freq: Every day | ORAL | Status: DC
  Administered 2018-09-18 – 2018-09-19 (×2): 60 mg via ORAL
  Filled 2018-09-18 (×2): qty 1

## 2018-09-18 MED ORDER — IOHEXOL 350 MG/ML SOLN
INTRAVENOUS | Status: DC | PRN
  Administered 2018-09-18: 165 mL via INTRA_ARTERIAL

## 2018-09-18 MED ORDER — HEPARIN SODIUM (PORCINE) 1000 UNIT/ML IJ SOLN
INTRAMUSCULAR | Status: AC
  Filled 2018-09-18: qty 1

## 2018-09-18 MED ORDER — ROSUVASTATIN CALCIUM 20 MG PO TABS
40.0000 mg | ORAL_TABLET | Freq: Every day | ORAL | Status: DC
  Administered 2018-09-18 – 2018-09-19 (×2): 40 mg via ORAL
  Filled 2018-09-18 (×2): qty 2

## 2018-09-18 MED ORDER — NITROGLYCERIN 1 MG/10 ML FOR IR/CATH LAB
INTRA_ARTERIAL | Status: DC | PRN
  Administered 2018-09-18 (×2): 200 ug via INTRACORONARY

## 2018-09-18 MED ORDER — ACETAMINOPHEN 325 MG PO TABS: 650.0000 mg | ORAL_TABLET | ORAL | Status: DC | PRN

## 2018-09-18 MED ORDER — TICAGRELOR 90 MG PO TABS
90.0000 mg | ORAL_TABLET | Freq: Two times a day (BID) | ORAL | Status: DC
  Administered 2018-09-18 – 2018-09-19 (×2): 90 mg via ORAL
  Filled 2018-09-18 (×2): qty 1

## 2018-09-18 MED ORDER — HEPARIN (PORCINE) IN NACL 1000-0.9 UT/500ML-% IV SOLN
INTRAVENOUS | Status: DC | PRN
  Administered 2018-09-18 (×2): 500 mL

## 2018-09-18 MED ORDER — LABETALOL HCL 5 MG/ML IV SOLN: 10.0000 mg | INTRAVENOUS | Status: AC | PRN

## 2018-09-18 MED ORDER — SODIUM CHLORIDE 0.9 % IV SOLN
INTRAVENOUS | Status: DC
  Administered 2018-09-18: 09:00:00 via INTRAVENOUS

## 2018-09-18 MED ORDER — HEPARIN SODIUM (PORCINE) 1000 UNIT/ML IJ SOLN
INTRAMUSCULAR | Status: DC | PRN
  Administered 2018-09-18: 3000 [IU] via INTRAVENOUS
  Administered 2018-09-18: 9000 [IU] via INTRAVENOUS

## 2018-09-18 MED ORDER — EZETIMIBE 10 MG PO TABS
10.0000 mg | ORAL_TABLET | Freq: Every day | ORAL | Status: DC
  Filled 2018-09-18 (×2): qty 1

## 2018-09-18 MED ORDER — SODIUM CHLORIDE 0.9 % IV SOLN: 250.0000 mL | INTRAVENOUS | Status: DC | PRN

## 2018-09-18 MED ORDER — POLYVINYL ALCOHOL 1.4 % OP SOLN
2.0000 [drp] | Freq: Every day | OPHTHALMIC | Status: DC | PRN
  Filled 2018-09-18: qty 15

## 2018-09-18 MED ORDER — MIDAZOLAM HCL 2 MG/2ML IJ SOLN
INTRAMUSCULAR | Status: AC
  Filled 2018-09-18: qty 2

## 2018-09-18 MED ORDER — MIDAZOLAM HCL 2 MG/2ML IJ SOLN
INTRAMUSCULAR | Status: DC | PRN
  Administered 2018-09-18: 2 mg via INTRAVENOUS

## 2018-09-18 MED ORDER — FENTANYL CITRATE (PF) 100 MCG/2ML IJ SOLN
INTRAMUSCULAR | Status: AC
  Filled 2018-09-18: qty 2

## 2018-09-18 MED ORDER — NITROGLYCERIN 1 MG/10 ML FOR IR/CATH LAB
INTRA_ARTERIAL | Status: AC
  Filled 2018-09-18: qty 10

## 2018-09-18 MED ORDER — ANGIOPLASTY BOOK
Freq: Once | Status: AC
  Administered 2018-09-18: 14:00:00
  Filled 2018-09-18: qty 1

## 2018-09-18 MED ORDER — CHLORHEXIDINE GLUCONATE CLOTH 2 % EX PADS
6.0000 | MEDICATED_PAD | Freq: Every day | CUTANEOUS | Status: DC
  Administered 2018-09-19: 07:00:00 6 via TOPICAL

## 2018-09-18 MED ORDER — AMLODIPINE BESYLATE 10 MG PO TABS
10.0000 mg | ORAL_TABLET | Freq: Every day | ORAL | Status: DC
  Administered 2018-09-19: 10 mg via ORAL
  Filled 2018-09-18: qty 1

## 2018-09-18 MED ORDER — METOPROLOL SUCCINATE ER 50 MG PO TB24
50.0000 mg | ORAL_TABLET | Freq: Every day | ORAL | Status: DC
  Administered 2018-09-18 – 2018-09-19 (×2): 50 mg via ORAL
  Filled 2018-09-18 (×2): qty 1

## 2018-09-18 SURGICAL SUPPLY — 23 items
BALLN SAPPHIRE 3.0X20 (BALLOONS) ×2
BALLN SAPPHIRE ~~LOC~~ 3.25X12 (BALLOONS) ×1 IMPLANT
BALLN SAPPHIRE ~~LOC~~ 3.5X18 (BALLOONS) ×1 IMPLANT
BALLN SAPPHIRE ~~LOC~~ 4.0X12 (BALLOONS) ×1 IMPLANT
BALLOON SAPPHIRE 3.0X20 (BALLOONS) IMPLANT
CATH DRAGONFLY OPTIS 2.7FR (CATHETERS) ×1 IMPLANT
CATH VISTA GUIDE 7FR XB 3.5 (CATHETERS) ×1 IMPLANT
CLOSURE MYNX CONTROL 6F/7F (Vascular Products) ×1 IMPLANT
CROWN DIAMONDBACK CLASSIC 1.25 (BURR) ×1 IMPLANT
ELECT DEFIB PAD ADLT CADENCE (PAD) ×1 IMPLANT
KIT ENCORE 26 ADVANTAGE (KITS) ×1 IMPLANT
KIT HEART LEFT (KITS) ×2 IMPLANT
LUBRICANT VIPERSLIDE CORONARY (MISCELLANEOUS) ×1 IMPLANT
PACK CARDIAC CATHETERIZATION (CUSTOM PROCEDURE TRAY) ×2 IMPLANT
SHEATH PINNACLE 7F 10CM (SHEATH) ×1 IMPLANT
SHEATH PROBE COVER 6X72 (BAG) ×1 IMPLANT
STENT ORSIRO 3.0X30 (Permanent Stent) ×1 IMPLANT
STENT ORSIRO 3.5X22 (Permanent Stent) ×1 IMPLANT
TRANSDUCER W/STOPCOCK (MISCELLANEOUS) ×2 IMPLANT
TUBING CIL FLEX 10 FLL-RA (TUBING) ×2 IMPLANT
WIRE EMERALD 3MM-J .035X150CM (WIRE) ×1 IMPLANT
WIRE MINAMO 190 (WIRE) ×1 IMPLANT
WIRE VIPERWIRE COR FLEX .012 (WIRE) ×2 IMPLANT

## 2018-09-18 NOTE — Progress Notes (Addendum)
ANTICOAGULATION CONSULT NOTE - Initial Consult  Pharmacy Consult for heparin Indication: chest pain/ACS  No Known Allergies  Patient Measurements: Height: _0  (185.4 cm) Weight: 190 lb (86.2 kg) IBW/kg (Calculated) : 79.9 Heparin Dosing Weight: 86.2 kg  Vital Signs: Temp: 98.7 F (37.1 C) (01/16 1220) Temp Source: Oral (01/16 1220) BP: 118/75 (01/16 1220) Pulse Rate: 53 (01/16 1220)  Labs: No results for input(s): HGB, HCT, PLT, APTT, LABPROT, INR, HEPARINUNFRC, HEPRLOWMOCWT, CREATININE, CKTOTAL, CKMB, TROPONINI in the last 72 hours.  Estimated Creatinine Clearance: 61.8 mL/min (by C-G formula based on SCr of 1.24 mg/dL).   Medical History: Past Medical History:  Diagnosis Date  . Coronary artery disease involving native heart with angina pectoris [facility]) 2005   a. 2005: CABG x5V in [place]  b. 04/2016: PCI/DES to SVG--> RCA. Severe Ost LCx (with CTO of grafted OMs - no retrograde filling) - distal Cx unprotected; RCA & LAD essentially CTO.;; 05/2018 - SVG-RCA ostial ISR with post-stent 80% - Overlapping DES & high Atm post-dilation of ISR. Progression of Native LM-ostLCx disease  . Essential hypertension   . HCV (hepatitis C virus)    "treated" (05/08/2018)  . HLD (hyperlipidemia)   . Hx of CABG 2001   [place] - CABG 4: LIMA-LAD, SeqSVG-OM1-OM2,SVG-rPDA  . MI (myocardial infarction) ([place]) 2001  . Non-ST elevation MI (NSTEMI) ([place]) 04/2016   Culprit lesion was 90% SVG-RPDA -> DES PCI. Also noted 80% ostial native circumflex with distal circumflex potentially in jeopardy -> MEDICAL Management.  . Type 2 diabetes mellitus with complication (HCC)    CAD    Medications:  Scheduled:  . [START ON 09/19/2018] amLODipine  10 mg Oral Daily  . angioplasty book   Does not apply Once  . [START ON 09/19/2018] aspirin EC  81 mg Oral Daily  . ezetimibe  10 mg Oral Daily  . isosorbide mononitrate  60 mg Oral Daily  . losartan  25 mg Oral Daily  . metoprolol succinate  50 mg Oral  Daily  . ranolazine  500 mg Oral BID  . rosuvastatin  40 mg Oral Daily  . sodium chloride flush  3 mL Intravenous Q12H  . ticagrelor  90 mg Oral BID    Assessment: 33 yom following cardiac cath with DES to LAD and Cx, with plan for heparin due to thrombus at distal stent. Plan to resume heparin infusion 8 hours after sheath removal for 12 hours (documented on 1/16_1 ).  No s/sx of bleeding. No prior anticoagulation- both PTA or before cath.   Goal of Therapy:  Heparin level 0.3-0.7 units/ml Monitor platelets by anticoagulation protocol: Yes   Plan:  Start heparin infusion at 1050 units/hr on 1/16_2  for 12 hours Check anti-Xa level in 8 hours and daily while on heparin Continue to monitor H&H and platelets  Antonietta Jewel, PharmD, [place] Clinical Pharmacist  Pager: [phone] Phone: [phone] 09/18/2018,2:05 PM

## 2018-09-18 NOTE — Progress Notes (Signed)
Patient and wife stated that patient doesn't take Zetia anymore.  [place] stopped it a couple of months ago per patient.

## 2018-09-18 NOTE — Interval H&P Note (Signed)
History and Physical Interval Note:  09/18/2018 8:42 AM  Brandon Vasquez  has presented today for surgery, with the diagnosis of UA -with known CAD.   The various methods of treatment have been discussed with the patient and family. After consideration of risks, benefits and other options for treatment, the patient has consented to  Procedure(s): CORONARY STENT INTERVENTION (N/A) as a surgical intervention .  The patient's history has been reviewed, patient examined, no change in status, stable for surgery.  I have reviewed the patient's chart and labs.  Questions were answered to the patient's satisfaction.    Cath Lab Visit (complete for each Cath Lab visit)  Clinical Evaluation Leading to the Procedure:   ACS: No.  Non-ACS:    Anginal Classification: CCS II  Anti-ischemic medical therapy: Maximal Therapy (2 or more classes of medications)  Non-Invasive Test Results: Low-risk stress test findings: cardiac mortality <1%/year -however appears to be equivocal on my personal review.  There is definitely a difference compared to previous stress test, and with ongoing symptoms, PCI of potential culprit lesion is warranted.  Prior CABG: Previous CABG    Brandon Vasquez

## 2018-09-19 ENCOUNTER — Encounter (HOSPITAL_COMMUNITY): Payer: Medicare Other

## 2018-09-19 ENCOUNTER — Other Ambulatory Visit: Payer: Self-pay | Admitting: Cardiology

## 2018-09-19 ENCOUNTER — Ambulatory Visit (HOSPITAL_COMMUNITY): Payer: Medicare Other

## 2018-09-19 DIAGNOSIS — I10 Essential (primary) hypertension: Secondary | ICD-10-CM | POA: Diagnosis not present

## 2018-09-19 DIAGNOSIS — N183 Chronic kidney disease, stage 3 unspecified: Secondary | ICD-10-CM

## 2018-09-19 DIAGNOSIS — I252 Old myocardial infarction: Secondary | ICD-10-CM | POA: Diagnosis not present

## 2018-09-19 DIAGNOSIS — Z7982 Long term (current) use of aspirin: Secondary | ICD-10-CM | POA: Diagnosis not present

## 2018-09-19 DIAGNOSIS — I209 Angina pectoris, unspecified: Secondary | ICD-10-CM

## 2018-09-19 DIAGNOSIS — E785 Hyperlipidemia, unspecified: Secondary | ICD-10-CM | POA: Diagnosis not present

## 2018-09-19 DIAGNOSIS — Z95820 Peripheral vascular angioplasty status with implants and grafts: Secondary | ICD-10-CM | POA: Diagnosis not present

## 2018-09-19 DIAGNOSIS — T50905D Adverse effect of unspecified drugs, medicaments and biological substances, subsequent encounter: Secondary | ICD-10-CM

## 2018-09-19 DIAGNOSIS — Z79899 Other long term (current) drug therapy: Secondary | ICD-10-CM | POA: Diagnosis not present

## 2018-09-19 DIAGNOSIS — Z8674 Personal history of sudden cardiac arrest: Secondary | ICD-10-CM | POA: Diagnosis not present

## 2018-09-19 DIAGNOSIS — Z794 Long term (current) use of insulin: Secondary | ICD-10-CM | POA: Diagnosis not present

## 2018-09-19 DIAGNOSIS — I25118 Atherosclerotic heart disease of native coronary artery with other forms of angina pectoris: Secondary | ICD-10-CM | POA: Diagnosis not present

## 2018-09-19 DIAGNOSIS — D649 Anemia, unspecified: Secondary | ICD-10-CM

## 2018-09-19 DIAGNOSIS — E119 Type 2 diabetes mellitus without complications: Secondary | ICD-10-CM | POA: Diagnosis not present

## 2018-09-19 DIAGNOSIS — Z87891 Personal history of nicotine dependence: Secondary | ICD-10-CM | POA: Diagnosis not present

## 2018-09-19 DIAGNOSIS — Z7984 Long term (current) use of oral hypoglycemic drugs: Secondary | ICD-10-CM | POA: Diagnosis not present

## 2018-09-19 DIAGNOSIS — Z951 Presence of aortocoronary bypass graft: Secondary | ICD-10-CM | POA: Diagnosis not present

## 2018-09-19 LAB — GLUCOSE, CAPILLARY: GLUCOSE-CAPILLARY: 132 mg/dL — AB (ref 70–99)

## 2018-09-19 LAB — BASIC METABOLIC PANEL
Anion gap: 8 (ref 5–15)
BUN: 19 mg/dL (ref 8–23)
CALCIUM: 8.8 mg/dL — AB (ref 8.9–10.3)
CO2: 22 mmol/L (ref 22–32)
Chloride: 108 mmol/L (ref 98–111)
Creatinine, Ser: 1.54 mg/dL — ABNORMAL HIGH (ref 0.61–1.24)
GFR calc non Af Amer: 45 mL/min — ABNORMAL LOW (ref >60)
GFR, EST AFRICAN AMERICAN: 52 mL/min — AB (ref >60)
Glucose, Bld: 104 mg/dL — ABNORMAL HIGH (ref 70–99)
Potassium: 3.8 mmol/L (ref 3.5–5.1)
SODIUM: 138 mmol/L (ref 135–145)

## 2018-09-19 LAB — CBC
HCT: 31.9 % — ABNORMAL LOW (ref 39.0–52.0)
Hemoglobin: 9.7 g/dL — ABNORMAL LOW (ref 13.0–17.0)
MCH: 20 pg — ABNORMAL LOW (ref 26.0–34.0)
MCHC: 30.4 g/dL (ref 30.0–36.0)
MCV: 65.9 fL — ABNORMAL LOW (ref 80.0–100.0)
NRBC: 0 % (ref 0.0–0.2)
Platelets: 172 10*3/uL (ref 150–400)
RBC: 4.84 MIL/uL (ref 4.22–5.81)
RDW: 18.3 % — ABNORMAL HIGH (ref 11.5–15.5)
WBC: 5.7 10*3/uL (ref 4.0–10.5)

## 2018-09-19 MED ORDER — ACETAMINOPHEN 325 MG PO TABS: 650.0000 mg | ORAL_TABLET | ORAL | Status: DC | PRN

## 2018-09-19 MED ORDER — METFORMIN HCL ER 500 MG PO TB24: 500.0000 mg | ORAL_TABLET | Freq: Every day | ORAL | Status: AC

## 2018-09-19 MED ORDER — ISOSORBIDE MONONITRATE ER 60 MG PO TB24: 60.0000 mg | ORAL_TABLET | Freq: Every day | ORAL | Status: DC

## 2018-09-19 MED ORDER — LOSARTAN POTASSIUM 25 MG PO TABS: 25.0000 mg | ORAL_TABLET | Freq: Every day | ORAL | Status: DC

## 2018-09-19 NOTE — Discharge Summary (Addendum)
Discharge Summary    Patient ID: OSKER AYOUB MRN: [id number]; DOB: July 24, 1946  Admit date: 09/18/2018 Discharge date: 09/19/2018  Primary Care Provider: Rikki Spearing, NP  Primary Cardiologist: [name], MD  Primary Electrophysiologist:  None   Discharge Diagnoses    Principal Problem:   Angina, class III [facility]) Active Problems:   S/P angioplasty with stent distal LM and LCX. 09/18/18   Essential hypertension   Diabetes mellitus type II, non insulin dependent (HCC)   Dyslipidemia, goal LDL below 70   Coronary artery disease involving native heart with angina pectoris [facility])   Coronary artery disease involving autologous vein coronary bypass graft with angina pectoris ([place])   Allergies No Known Allergies  Diagnostic Studies/Procedures    09/19/18  Cath and PCI  ______Mid LM to Ost LAD lesion is 95% & Ost Cx lesion is 90% stenosed.  Ost LAD to Prox LAD lesion is 99% stenosed. Prox LAD lesion is 100% stenosed.  Following orbital atherectomy, A drug-eluting stent was successfully placed using a STENT ORSIRO 3.5X22 -overlapping the distal stent. Postdilated to 4.1 mm proximal to 3.6 at the overlap.  Prox Cx lesion is 80% stenosed. Prox Cx to Mid Cx lesion is 75% stenosed. -Seen with postatherectomy OCT -these distal lesions were stented first followed by an upstream stent  After visualization with OCT, a drug-eluting stent was successfully placed using a STENT ORSIRO 3.0X30 coverning both lesions - post dilated to ~2.9-3.25 mm  Post intervention, there is a 0% residual stenosis.  ---------  Colon [place] 1st Mrg lesion is 100% stenosed.  Ost 2nd Mrg to 2nd Mrg lesion is 100% stenosed.  Low normal LVEDP  Post intervention, there is a 0% residual stenosis.  Ost Cx lesion is 90% stenosed.  Prox Cx to Mid Cx lesion is 75% stenosed.   SUMMARY  Severe distal left main-ostial circumflex and proximal circumflex stenoses (80% in 2 sites)  Successful Atherectomy based OCT  guided PCI with 2 overlapping DES stent placement (Osiro 3.0 x 32 mm & 3.5 x 23 mm -postdilated and tapered fashion from 4.1 to 2.8 mm)  Normal to low LVEDP treated with IV fluid bolus  Failed Mynx closure with immediate manual pressure held - no hematoma  RECOMMENDATION  Overnight monitoring in the post procedure unit.  OCT indicated a focal area of what appears to be thrombus at the distal stent.  Will run 12 hours of IV heparin beginning 8 hours after sheath removal.  Continue current medications, but can reduce Imdur down to 60 mg.  Anticipate discharge on January 17.  Follow-up scheduled.   Intervention      _______   History of Present Illness     73 yo Male with hx of CAD and NSTEMI , CAD with CABG X 4 in 2001 after cardiac arrest.  Has done well until 2018 with angina but stable nuc.  And in sept 2019 progressive angina.  Cath in August with stent to VG to PDA with synergy DES and LIMA to LAD is patent, but noted to have Lt. Main into Ostial LCX 80% not bypassed.  He had nuc in November that was low risk but he continued with angina. With continued angina pt brought back for distal L Main stenting into LCX.  Pt did well with procedure and admitted overnight with an additional 12 hours of heparin for distal stent.    Hospital Course     Consultants: none   Today Cr is up to 1.54 from 1.24 prior  to cath.    Sinus brady on EKG and no acute changes.  He has had some dizziness prior to admit.  Will see how he does after discharge may need decrease toprol XL.  Pt ambulated with cardiac rehab without the chest pain and SOB he had prior to admit.  He has been seen and evaluated by Dr. Adora Fridge and found stable for discharge.   HgB is down slightly.  Will check BMP and CBC post op.      _____________  Discharge Vitals Blood pressure 130/79, pulse (!) 52, temperature 97.8 F (36.6 C), temperature source Oral, resp. rate 16, height 6\' 1"  (1.854 m), weight 85.4 kg, SpO2 99 %.    Filed Weights   09/18/18 0742 09/19/18 0246  Weight: 86.2 kg 85.4 kg   General:Pleasant affect, NAD Skin:Warm and dry, brisk capillary refill HEENT:normocephalic, sclera clear, mucus membranes moist Neck:supple, no JVD, no bruits  Heart:S1S2 RRR without murmur, gallup, rub or click Lungs:clear without rales, rhonchi, or wheezes FBP:ZWCH, non tender, + BS, do not palpate liver spleen or masses Ext:no lower ext edema, 2+ pedal pulses, 2+ radial pulses, Rt groin cath site with marble size hematoma, no bleeding.  Neuro:alert and oriented X 3, MAE, follows commands, + facial symmetry  Labs & Radiologic Studies    CBC Recent Labs    09/19/18 0704  WBC 5.7  HGB 9.7*  HCT 31.9*  MCV 65.9*  PLT 852   Basic Metabolic Panel Recent Labs    09/19/18 0704  NA 138  K 3.8  CL 108  CO2 22  GLUCOSE 104*  BUN 19  CREATININE 1.54*  CALCIUM 8.8*   Liver Function Tests No results for input(s): AST, ALT, ALKPHOS, BILITOT, PROT, ALBUMIN in the last 72 hours. No results for input(s): LIPASE, AMYLASE in the last 72 hours. Cardiac Enzymes No results for input(s): CKTOTAL, CKMB, CKMBINDEX, TROPONINI in the last 72 hours. BNP Invalid input(s): POCBNP D-Dimer No results for input(s): DDIMER in the last 72 hours. Hemoglobin A1C No results for input(s): HGBA1C in the last 72 hours. Fasting Lipid Panel No results for input(s): CHOL, HDL, LDLCALC, TRIG, CHOLHDL, LDLDIRECT in the last 72 hours. Thyroid Function Tests No results for input(s): TSH, T4TOTAL, T3FREE, THYROIDAB in the last 72 hours.  Invalid input(s): FREET3 _____________  No results found. Disposition   Pt is being discharged home today in good condition.  Follow-up Plans & Appointments   Heart Healthy Diet Call [facility] at [phone] if any bleeding, swelling or drainage at cath site.  May shower, no tub baths for 48 hours for groin sticks. No lifting over 5 pounds for 3 days.  No Driving for  3 days  May resume cardiac rehab Friday the 24th of Jan.  Call if any lightheadedness or chest pain. Do not stop asprin or brilinta, stopping could cause a heart attack.  No metformin until the 19th may interact with cath dye.  Have lab work drawn on 10/01/17 at Dr. Allison Quarry office   [place] for 2 days then resume. On the 19th.    Follow-up Information    Leonie Man, MD Follow up on 10/09/2018.   Specialty:  Cardiology Why:  at 9:40 AM  Contact information: [address] [place] [address] [place] [facility] Follow up on 10/01/2018.   Specialty:  Cardiology Why:  have lab work done at Dr. Darcus Pester office.  Contact information: [place] [phone]         Discharge Instructions    AMB Referral to Cardiac Rehabilitation - Phase II   Complete by:  As directed    Diagnosis:   Stable Angina Coronary Stents        Discharge Medications   Allergies as of 09/19/2018   No Known Allergies     Medication List    TAKE these medications   acetaminophen 325 MG tablet Commonly known as:  TYLENOL Take 2 tablets (650 mg total) by mouth every 4 (four) hours as needed for headache or mild pain.   amLODipine 10 MG tablet Commonly known as:  NORVASC Take 1 tablet (10 mg total) by mouth daily.   ARTIFICIAL TEARS OP Apply 1-2 drops to eye daily as needed (dry eyes).   aspirin 81 MG EC tablet Take 81 mg by mouth daily. Swallow whole.   ezetimibe 10 MG tablet Commonly known as:  ZETIA Take 1 tablet (10 mg total) by mouth daily.   isosorbide mononitrate 60 MG 24 hr tablet Commonly known as:  IMDUR Take 1 tablet (60 mg total) by mouth daily.   losartan 25 MG tablet Commonly known as:  COZAAR Take 1 tablet (25 mg total) by mouth daily. Start taking on:  September 22, 2018 What changed:  These instructions start on September 22, 2018. If you are unsure what to do  until then, ask your doctor or other care provider.   metFORMIN 500 MG 24 hr tablet Commonly known as:  GLUCOPHAGE-XR Take 1 tablet (500 mg total) by mouth daily. Start taking on:  September 21, 2018 What changed:  These instructions start on September 21, 2018. If you are unsure what to do until then, ask your doctor or other care provider.   metoprolol succinate 50 MG 24 hr tablet Commonly known as:  TOPROL-XL Take 1 tablet (50 mg total) by mouth daily. Take with or immediately following a meal.   nitroGLYCERIN 0.4 MG SL tablet Commonly known as:  NITROSTAT Place 1 tablet (0.4 mg total) under the tongue every 5 (five) minutes x 3 doses as needed for chest pain.   ranolazine 500 MG 12 hr tablet Commonly known as:  RANEXA Take 1 tablet (500 mg total) by mouth 2 (two) times daily.   rosuvastatin 40 MG tablet Commonly known as:  CRESTOR Take 1 tablet (40 mg total) by mouth daily.   ticagrelor 90 MG Tabs tablet Commonly known as:  BRILINTA Take 1 tablet (90 mg total) by mouth 2 (two) times daily.        Acute coronary syndrome (MI, NSTEMI, STEMI, etc) this admission?: No.    Outstanding Labs/Studies   Needs BMET and CBC 10/01/17   Duration of Discharge Encounter   Greater than 30 minutes including physician time.  Signed, Cecilie Kicks, NP 09/19/2018, 10:25 AM   Agree with note written by Cecilie Kicks RNP  Mr. Vavra had a complex left main/circumflex atherectomy, OCT and stent procedure by Dr. Ellyn Hack yesterday.  He feels clinically improved today.  He is stable for discharge.  We will arrange early follow-up.  Because his serum creatinine increased we will hold his ARB and arrange outpatient assessment of his renal function within 7 days.  Quay Burow 09/19/2018 10:52 AM

## 2018-09-19 NOTE — Progress Notes (Signed)
CARDIAC REHAB PHASE I   PRE:  Rate/Rhythm: 57 SB    BP: sitting 130/79    SaO2:   MODE:  Ambulation: 400 ft   POST:  Rate/Rhythm: 84 SR    BP: sitting 143/79     SaO2:   Tolerated well, no burning in chest like he had been having PTA with any exertion. Reviewed ed with pt, esp NTG. He understands importance of Brilinta/ASA. Will facilitate pt resuming CRPII. [place], ACSM 09/19/2018 9:09 AM

## 2018-09-19 NOTE — Discharge Instructions (Addendum)
Heart Healthy Diet Call [facility] at [phone] if any bleeding, swelling or drainage at cath site.  May shower, no tub baths for 48 hours for groin sticks. No lifting over 5 pounds for 3 days.  No Driving for 3 days  May resume cardiac rehab Friday the 24th of Jan.  Call if any lightheadedness or chest pain. Do not stop asprin or brilinta, stopping could cause a heart attack.   No metformin until the 19th may interact with cath dye.  Have lab work drawn on 10/01/17 at Dr. Allison Quarry office   [place] for 2 days then resume. On the 19th.        Information about your medication: Brilinta (anti-platelet agent)  Generic Name (Brand): ticagrelor (Brilinta), twice daily medication  PURPOSE: You are taking this medication along with aspirin to lower your chance of having a heart attack, stroke, or blood clots in your heart stent. These can be fatal. Brilinta and aspirin help prevent platelets from sticking together and forming a clot that can block an artery or your stent.   Common SIDE EFFECTS you may experience include: bruising or bleeding more easily, shortness of breath  Do not stop taking BRILINTA without talking to the doctor who prescribes it for you. People who are treated with a stent and stop taking Brilinta too soon, have a higher risk of getting a blood clot in the stent, having a heart attack, or dying. If you stop Brilinta because of bleeding, or for other reasons, your risk of a heart attack or stroke may increase.   Tell all of your doctors and dentists that you are taking Brilinta. They should talk to the doctor who prescribed Brilinta for you before you have any surgery or invasive procedure.   Contact your health care provider if you experience: severe or uncontrollable bleeding, pink/red/brown urine, vomiting blood or vomit that looks like "coffee grounds", red or black stools (looks like tar), coughing up blood or blood  clots ----------------------------------------------------------------------------------------------------------------------

## 2018-09-22 ENCOUNTER — Ambulatory Visit (HOSPITAL_COMMUNITY): Payer: Medicare Other

## 2018-09-22 ENCOUNTER — Encounter (HOSPITAL_COMMUNITY): Payer: Medicare Other

## 2018-09-24 ENCOUNTER — Ambulatory Visit (HOSPITAL_COMMUNITY): Payer: Medicare Other

## 2018-09-24 ENCOUNTER — Encounter (HOSPITAL_COMMUNITY): Payer: Medicare Other

## 2018-09-26 ENCOUNTER — Encounter (HOSPITAL_COMMUNITY)
Admission: RE | Admit: 2018-09-26 | Discharge: 2018-09-26 | Disposition: A | Discharge status: Home / Self Care | Payer: Medicare Other | Source: Ambulatory Visit | Attending: Cardiology | Admitting: Cardiology

## 2018-09-26 ENCOUNTER — Ambulatory Visit (HOSPITAL_COMMUNITY): Payer: Medicare Other

## 2018-09-26 DIAGNOSIS — I10 Essential (primary) hypertension: Secondary | ICD-10-CM | POA: Diagnosis not present

## 2018-09-26 DIAGNOSIS — I214 Non-ST elevation (NSTEMI) myocardial infarction: Secondary | ICD-10-CM | POA: Diagnosis not present

## 2018-09-26 DIAGNOSIS — I25119 Atherosclerotic heart disease of native coronary artery with unspecified angina pectoris: Secondary | ICD-10-CM | POA: Diagnosis not present

## 2018-09-26 DIAGNOSIS — E119 Type 2 diabetes mellitus without complications: Secondary | ICD-10-CM | POA: Diagnosis not present

## 2018-09-26 DIAGNOSIS — E785 Hyperlipidemia, unspecified: Secondary | ICD-10-CM | POA: Diagnosis not present

## 2018-09-26 DIAGNOSIS — Z955 Presence of coronary angioplasty implant and graft: Secondary | ICD-10-CM | POA: Diagnosis not present

## 2018-09-26 LAB — GLUCOSE, CAPILLARY
Glucose-Capillary: 108 mg/dL — ABNORMAL HIGH (ref 70–99)
Glucose-Capillary: 90 mg/dL (ref 70–99)

## 2018-09-27 ENCOUNTER — Encounter: Payer: Self-pay | Admitting: Cardiology

## 2018-09-29 ENCOUNTER — Ambulatory Visit (HOSPITAL_COMMUNITY): Payer: Medicare Other

## 2018-09-29 ENCOUNTER — Encounter (HOSPITAL_COMMUNITY)
Admission: RE | Admit: 2018-09-29 | Discharge: 2018-09-29 | Disposition: A | Discharge status: Home / Self Care | Payer: Medicare Other | Source: Ambulatory Visit | Attending: Cardiology | Admitting: Cardiology

## 2018-09-29 DIAGNOSIS — I25119 Atherosclerotic heart disease of native coronary artery with unspecified angina pectoris: Secondary | ICD-10-CM | POA: Diagnosis not present

## 2018-09-29 DIAGNOSIS — Z955 Presence of coronary angioplasty implant and graft: Secondary | ICD-10-CM

## 2018-09-29 DIAGNOSIS — I214 Non-ST elevation (NSTEMI) myocardial infarction: Secondary | ICD-10-CM | POA: Diagnosis not present

## 2018-09-29 DIAGNOSIS — E119 Type 2 diabetes mellitus without complications: Secondary | ICD-10-CM | POA: Diagnosis not present

## 2018-09-29 DIAGNOSIS — E785 Hyperlipidemia, unspecified: Secondary | ICD-10-CM | POA: Diagnosis not present

## 2018-09-29 DIAGNOSIS — I10 Essential (primary) hypertension: Secondary | ICD-10-CM | POA: Diagnosis not present

## 2018-10-01 ENCOUNTER — Ambulatory Visit (HOSPITAL_COMMUNITY): Payer: Medicare Other

## 2018-10-01 ENCOUNTER — Encounter (HOSPITAL_COMMUNITY)
Admission: RE | Admit: 2018-10-01 | Discharge: 2018-10-01 | Disposition: A | Discharge status: Home / Self Care | Payer: Medicare Other | Source: Ambulatory Visit | Attending: Cardiology | Admitting: Cardiology

## 2018-10-01 DIAGNOSIS — Z955 Presence of coronary angioplasty implant and graft: Secondary | ICD-10-CM | POA: Diagnosis not present

## 2018-10-01 DIAGNOSIS — I214 Non-ST elevation (NSTEMI) myocardial infarction: Secondary | ICD-10-CM | POA: Diagnosis not present

## 2018-10-01 DIAGNOSIS — E119 Type 2 diabetes mellitus without complications: Secondary | ICD-10-CM | POA: Diagnosis not present

## 2018-10-01 DIAGNOSIS — I25119 Atherosclerotic heart disease of native coronary artery with unspecified angina pectoris: Secondary | ICD-10-CM | POA: Diagnosis not present

## 2018-10-01 DIAGNOSIS — I10 Essential (primary) hypertension: Secondary | ICD-10-CM | POA: Diagnosis not present

## 2018-10-01 DIAGNOSIS — E785 Hyperlipidemia, unspecified: Secondary | ICD-10-CM | POA: Diagnosis not present

## 2018-10-03 ENCOUNTER — Ambulatory Visit (HOSPITAL_COMMUNITY): Payer: Medicare Other

## 2018-10-03 ENCOUNTER — Encounter (HOSPITAL_COMMUNITY)
Admission: RE | Admit: 2018-10-03 | Discharge: 2018-10-03 | Disposition: A | Discharge status: Home / Self Care | Payer: Medicare Other | Source: Ambulatory Visit | Attending: Cardiology | Admitting: Cardiology

## 2018-10-03 DIAGNOSIS — E119 Type 2 diabetes mellitus without complications: Secondary | ICD-10-CM | POA: Diagnosis not present

## 2018-10-03 DIAGNOSIS — E785 Hyperlipidemia, unspecified: Secondary | ICD-10-CM | POA: Diagnosis not present

## 2018-10-03 DIAGNOSIS — Z955 Presence of coronary angioplasty implant and graft: Secondary | ICD-10-CM

## 2018-10-03 DIAGNOSIS — I214 Non-ST elevation (NSTEMI) myocardial infarction: Secondary | ICD-10-CM | POA: Diagnosis not present

## 2018-10-03 DIAGNOSIS — I25119 Atherosclerotic heart disease of native coronary artery with unspecified angina pectoris: Secondary | ICD-10-CM | POA: Diagnosis not present

## 2018-10-03 DIAGNOSIS — I10 Essential (primary) hypertension: Secondary | ICD-10-CM | POA: Diagnosis not present

## 2018-10-03 NOTE — Progress Notes (Signed)
Brandon Vasquez 73 y.o. male DOB: June 02, 1946 MRN: [id number]      Nutrition Note  1. 05/09/2018 Stented coronary artery    Past Medical History:  Diagnosis Date  . Coronary artery disease involving native heart with angina pectoris [facility]) 2005   a. 2005: CABG x5V in [place]  b. 04/2016: PCI/DES to SVG--> RCA. Severe Ost LCx (with CTO of grafted OMs - no retrograde filling) - distal Cx unprotected; RCA & LAD essentially CTO.;; 05/2018 - SVG-RCA ostial ISR with post-stent 80% - Overlapping DES & high Atm post-dilation of ISR. Progression of Native LM-ostLCx disease  . Essential hypertension   . HCV (hepatitis C virus)    "treated" (05/08/2018)  . HLD (hyperlipidemia)   . Hx of CABG 2001   [place] - CABG 4: LIMA-LAD, SeqSVG-OM1-OM2,SVG-rPDA  . MI (myocardial infarction) ([place]) 2001  . Non-ST elevation MI (NSTEMI) ([place]) 04/2016   Culprit lesion was 90% SVG-RPDA -> DES PCI. Also noted 80% ostial native circumflex with distal circumflex potentially in jeopardy -> MEDICAL Management.  . Type 2 diabetes mellitus with complication ([place])    CAD   Meds reviewed.   Current Outpatient Medications (Endocrine & Metabolic):  .  metFORMIN (GLUCOPHAGE-XR) 500 MG 24 hr tablet, Take 500 mg by mouth daily.   Current Outpatient Medications (Cardiovascular):  .  amLODipine (NORVASC) 10 MG tablet, Take 1 tablet (10 mg total) by mouth daily. Marland Kitchen  ezetimibe (ZETIA) 10 MG tablet, Take 1 tablet (10 mg total) by mouth daily. .  isosorbide mononitrate (IMDUR) 60 MG 24 hr tablet, Take 1 tablet (60 mg total) by mouth daily. .  metoprolol succinate (TOPROL XL) 25 MG 24 hr tablet, Take 1 tablet (25 mg total) by mouth daily. .  nitroGLYCERIN (NITROSTAT) 0.4 MG SL tablet, Place 1 tablet (0.4 mg total) under the tongue every 5 (five) minutes x 3 doses as needed for chest pain. (Patient not taking: Reported on 08/04/2018) .  rosuvastatin (CRESTOR) 40 MG tablet, Take 1 tablet (40 mg total) by mouth daily.   Current Outpatient  Medications (Analgesics):  .  aspirin 81 MG EC tablet, Take 81 mg by mouth daily. Swallow whole. Marland Kitchen  HYDROcodone-acetaminophen (NORCO) 5-325 MG tablet, Take 1 tablet by mouth every 6 (six) hours as needed for moderate pain. (Patient not taking: Reported on 08/04/2018)  Current Outpatient Medications (Hematological):  .  ticagrelor (BRILINTA) 90 MG TABS tablet, Take 1 tablet (90 mg total) by mouth 2 (two) times daily.  Current Outpatient Medications (Other):  Marland Kitchen  Hypromellose (ARTIFICIAL TEARS OP), Apply 1-2 drops to eye daily as needed (dry eyes).   HT: Ht Readings from Last 1 Encounters:  08/07/18 5' 11.5" (1.816 m)    WT: Wt Readings from Last 5 Encounters:  08/07/18 197 lb 8.5 oz (89.6 kg)  07/15/18 190 lb (86.2 kg)  06/16/18 190 lb 9.6 oz (86.5 kg)  05/27/18 188 lb (85.3 kg)  05/09/18 189 lb 2.5 oz (85.8 kg)     Body mass index is 27.17 kg/m.    Current tobacco use? No  Labs:  Lipid Panel     Component Value Date/Time   CHOL 153 04/29/2018 1513   TRIG 70 04/29/2018 1513   HDL 61 04/29/2018 1513   CHOLHDL 2.5 04/29/2018 1513   CHOLHDL 3.6 11/22/2017 1030   VLDL 16 12/12/2016 1000   LDLCALC 78 04/29/2018 1513   LDLCALC 115 (H) 11/22/2017 1030    No results found for: HGBA1C CBG (last 3)  No results for  input(s): GLUCAP in the last 72 hours.  Nutrition Note Spoke with pt. Nutrition plan and goals reviewed with pt. Pt is following heart healthy diet. Pt shared he is happy with his weight currently, does not wish to make any changes at this time. Heart healthy diabetic eating tips from orientation reviewed (label reading, how to build a healthy plate, portion sizes, eating frequently across the day, carb counting/ how to determine carbohydrate serving size). Pt has Type 2 Diabetes, continues to not check his CBG's regularly, he is unsure of his average fasting CBG's. Reviewed the importance and benefits of regularly checking CBG's with patient. Recommended pt check CBG's  before and after meals and keep a log to share with medical providers. Pt expressed understanding of the information reviewed. Pt aware of nutrition education classes offered and would like to attend nutrition classes.  Nutrition Diagnosis ? Food-and nutrition-related knowledge deficit related to lack of exposure to information as related to diagnosis of: ? CVD ? Type 2 Diabetes  Nutrition Intervention ? Pt's individual nutrition plan and goals reviewed with pt  Nutrition Goal(s):   ? Pt to identify and limit food sources of saturated fat, trans fat, refined carbohydrates and sodium ? Pt to describe the benefit of including fruits, vegetables, whole grains, and low-fat dairy products in a heart healthy meal plan. ? Pt able to name foods that affect blood glucose.   Plan:  ? Pt to attend nutrition classes ? Nutrition I ? Nutrition II ? Portion Distortion  ? Diabetes Blitz ? Diabetes Q & Ae determined ? Will provide client-centered nutrition education as part of interdisciplinary care ? Monitor and evaluate progress toward nutrition goal with team.   Laurina Bustle, MS, RD, LDN 1?31/2020 10:01 AM

## 2018-10-06 ENCOUNTER — Ambulatory Visit (HOSPITAL_COMMUNITY): Payer: Medicare Other

## 2018-10-06 ENCOUNTER — Encounter (HOSPITAL_COMMUNITY)
Admission: RE | Admit: 2018-10-06 | Discharge: 2018-10-06 | Disposition: A | Discharge status: Home / Self Care | Payer: Medicare Other | Source: Ambulatory Visit | Attending: Cardiology | Admitting: Cardiology

## 2018-10-06 DIAGNOSIS — Z955 Presence of coronary angioplasty implant and graft: Secondary | ICD-10-CM | POA: Diagnosis not present

## 2018-10-06 DIAGNOSIS — I25119 Atherosclerotic heart disease of native coronary artery with unspecified angina pectoris: Secondary | ICD-10-CM | POA: Diagnosis not present

## 2018-10-06 DIAGNOSIS — E119 Type 2 diabetes mellitus without complications: Secondary | ICD-10-CM | POA: Diagnosis not present

## 2018-10-06 DIAGNOSIS — E785 Hyperlipidemia, unspecified: Secondary | ICD-10-CM | POA: Insufficient documentation

## 2018-10-06 DIAGNOSIS — I10 Essential (primary) hypertension: Secondary | ICD-10-CM | POA: Diagnosis not present

## 2018-10-06 DIAGNOSIS — I214 Non-ST elevation (NSTEMI) myocardial infarction: Secondary | ICD-10-CM | POA: Insufficient documentation

## 2018-10-06 DIAGNOSIS — Z87891 Personal history of nicotine dependence: Secondary | ICD-10-CM | POA: Diagnosis not present

## 2018-10-08 ENCOUNTER — Ambulatory Visit (HOSPITAL_COMMUNITY): Payer: Medicare Other

## 2018-10-08 ENCOUNTER — Encounter (HOSPITAL_COMMUNITY)
Admission: RE | Admit: 2018-10-08 | Discharge: 2018-10-08 | Disposition: A | Discharge status: Home / Self Care | Payer: Medicare Other | Source: Ambulatory Visit

## 2018-10-09 ENCOUNTER — Encounter: Payer: Self-pay | Admitting: Cardiology

## 2018-10-09 ENCOUNTER — Ambulatory Visit (INDEPENDENT_AMBULATORY_CARE_PROVIDER_SITE_OTHER): Payer: Medicare Other | Admitting: Cardiology

## 2018-10-09 VITALS — BP 124/64 | HR 60 | Ht 73.0 in | Wt 193.0 lb

## 2018-10-09 DIAGNOSIS — I25719 Atherosclerosis of autologous vein coronary artery bypass graft(s) with unspecified angina pectoris: Secondary | ICD-10-CM

## 2018-10-09 DIAGNOSIS — I25119 Atherosclerotic heart disease of native coronary artery with unspecified angina pectoris: Secondary | ICD-10-CM | POA: Diagnosis not present

## 2018-10-09 DIAGNOSIS — I200 Unstable angina: Secondary | ICD-10-CM | POA: Diagnosis not present

## 2018-10-09 DIAGNOSIS — E785 Hyperlipidemia, unspecified: Secondary | ICD-10-CM | POA: Diagnosis not present

## 2018-10-09 DIAGNOSIS — I10 Essential (primary) hypertension: Secondary | ICD-10-CM

## 2018-10-09 DIAGNOSIS — Z95820 Peripheral vascular angioplasty status with implants and grafts: Secondary | ICD-10-CM | POA: Diagnosis not present

## 2018-10-09 DIAGNOSIS — D500 Iron deficiency anemia secondary to blood loss (chronic): Secondary | ICD-10-CM | POA: Diagnosis not present

## 2018-10-09 DIAGNOSIS — D509 Iron deficiency anemia, unspecified: Secondary | ICD-10-CM | POA: Insufficient documentation

## 2018-10-09 DIAGNOSIS — I209 Angina pectoris, unspecified: Secondary | ICD-10-CM | POA: Diagnosis not present

## 2018-10-09 NOTE — Patient Instructions (Addendum)
Medication Instructions:   continue  With current medications and after April 15,2020 you may stop taking aspirin 81 mg but continue the Brilinta 90 mg twice a day  If you need a refill on your cardiac medications before your next appointment, please call your pharmacy.   Lab work: Not needed If you have labs (blood work) drawn today and your tests are completely normal, you will receive your results only by: Marland Kitchen MyChart Message (if you have MyChart) OR . A paper copy in the mail If you have any lab test that is abnormal or we need to change your treatment, we will call you to review the results.  Testing/Procedures: NOT NEEDED  Follow-Up: At [facility], you and your health needs are our priority.  As part of our continuing mission to provide you with exceptional heart care, we have created designated Provider Care Teams.  These Care Teams include your primary Cardiologist (physician) and Advanced Practice Providers (APPs -  Physician Assistants and Nurse Practitioners) who all work together to provide you with the care you need, when you need it. Your physician recommends that you schedule a follow-up appointment in 4 months with Dr Ellyn Hack  Any Other Special Instructions Will Be Listed Below (If Applicable).   Colonoscopy can be done 3 months from 09/18/18 ,but Preferably would like to wait at least 6 months.

## 2018-10-09 NOTE — Progress Notes (Signed)
Cardiac Individual Treatment Plan  Patient Details  Name: Brandon Vasquez MRN: [id number] Date of Birth: Oct 27, 1945 Referring Provider:     [facility] from 08/07/2018 in [place]  Referring Provider  Dr. Ellyn Hack      Initial Encounter Date:    CARDIAC REHAB PHASE II ORIENTATION from 08/07/2018 in [place]  Date  08/07/18      Visit Diagnosis: 05/09/2018 Stented coronary artery S/P DES RPDA Graft  Patient's Home Medications on Admission:  Current Outpatient Medications:  .  amLODipine (NORVASC) 10 MG tablet, Take 1 tablet (10 mg total) by mouth daily., Disp: 90 tablet, Rfl: 4 .  aspirin 81 MG EC tablet, Take 81 mg by mouth daily. Swallow whole., Disp: , Rfl:  .  ezetimibe (ZETIA) 10 MG tablet, Take 1 tablet (10 mg total) by mouth daily., Disp: 30 tablet, Rfl: 6 .  Hypromellose (ARTIFICIAL TEARS OP), Apply 1-2 drops to eye daily as needed (dry eyes)., Disp: , Rfl:  .  isosorbide mononitrate (IMDUR) 60 MG 24 hr tablet, Take 1 tablet (60 mg total) by mouth daily., Disp: , Rfl:  .  losartan (COZAAR) 25 MG tablet, Take 1 tablet (25 mg total) by mouth daily., Disp: , Rfl:  .  metFORMIN (GLUCOPHAGE-XR) 500 MG 24 hr tablet, Take 1 tablet (500 mg total) by mouth daily., Disp: , Rfl:  .  metoprolol succinate (TOPROL-XL) 50 MG 24 hr tablet, Take 1 tablet (50 mg total) by mouth daily. Take with or immediately following a meal., Disp: 30 tablet, Rfl: 6 .  nitroGLYCERIN (NITROSTAT) 0.4 MG SL tablet, Place 1 tablet (0.4 mg total) under the tongue every 5 (five) minutes x 3 doses as needed for chest pain., Disp: 25 tablet, Rfl: 6 .  ranolazine (RANEXA) 500 MG 12 hr tablet, Take 1 tablet (500 mg total) by mouth 2 (two) times daily., Disp: 60 tablet, Rfl: 5 .  rosuvastatin (CRESTOR) 40 MG tablet, Take 1 tablet (40 mg total) by mouth daily., Disp: 90 tablet, Rfl: 3 .  ticagrelor (BRILINTA) 90 MG TABS tablet, Take 1  tablet (90 mg total) by mouth 2 (two) times daily., Disp: 60 tablet, Rfl: 5  Past Medical History: Past Medical History:  Diagnosis Date  . Coronary artery disease involving native heart with angina pectoris [facility]) 2005   a. 2005: CABG x5V in [place]  b. 04/2016: PCI/DES to SVG--> RCA. Severe Ost LCx (with CTO of grafted OMs - no retrograde filling) - distal Cx unprotected; RCA & LAD essentially CTO.;; 05/2018 - SVG-RCA ostial ISR with post-stent 80% - Overlapping DES & high Atm post-dilation of ISR. Progression of Native LM-ostLCx disease; Sep 19, 2018 - Orbital Atherectomy - DES PCI dLM-ProxCx  2 overlapping DES  . Essential hypertension   . HCV (hepatitis C virus)    "treated" (05/08/2018)  . HLD (hyperlipidemia)   . Hx of CABG 2001   [place] - CABG 4: LIMA-LAD, SeqSVG-OM1-OM2,SVG-rPDA  . MI (myocardial infarction) ([place]) 2001  . Non-ST elevation MI (NSTEMI) ([place]) 04/2016   Culprit lesion was 90% SVG-RPDA -> DES PCI. Also noted 80% ostial native circumflex with distal circumflex potentially in jeopardy -> MEDICAL Management.  . Type 2 diabetes mellitus with complication (HCC)    CAD    Tobacco Use: Social History   Tobacco Use  Smoking Status Former Smoker  . Packs/day: 1.00  . Years: 30.00  . Pack years: 30.00  . Types: Cigarettes  .  Last attempt to quit: 07/10/1996  . Years since quitting: 22.2  Smokeless Tobacco Never Used    Labs: Recent Review Flowsheet Data    Labs for ITP Cardiac and Pulmonary Rehab Latest Ref Rng & Units 04/22/2016 12/12/2016 03/28/2017 11/22/2017 04/29/2018   Cholestrol 100 - 199 mg/dL 162 147 - 193 153   LDLCALC 0 - 99 mg/dL 95 73 - 115(H) 78   HDL >39 mg/dL 53 58 - 54 61   Trlycerides 0 - 149 mg/dL 69 82 - 126 70   TCO2 0 - 100 mmol/L - - 24 - -      Capillary Blood Glucose: Lab Results  Component Value Date   GLUCAP 108 (H) 09/26/2018   GLUCAP 90 09/26/2018   GLUCAP 132 (H) 09/19/2018   GLUCAP 88 09/18/2018   GLUCAP 109 (H) 09/18/2018      Exercise Target Goals: Exercise Program Goal: Individual exercise prescription set using results from initial 6 min walk test and THRR while considering  patient's activity barriers and safety.   Exercise Prescription Goal: Initial exercise prescription builds to 30-45 minutes a day of aerobic activity, 2-3 days per week.  Home exercise guidelines will be given to patient during program as part of exercise prescription that the participant will acknowledge.  Activity Barriers & Risk Stratification: Activity Barriers & Cardiac Risk Stratification - 08/07/18 1119      Activity Barriers & Cardiac Risk Stratification   Activity Barriers  None    Cardiac Risk Stratification  Moderate       6 Minute Walk: 6 Minute Walk    Row Name 08/07/18 1119         6 Minute Walk   Phase  Initial     Distance  1200 feet     Walk Time  6 minutes     # of Rest Breaks  0     MPH  2.27     METS  2.54     RPE  9     VO2 Peak  8.89     Symptoms  No     Resting HR  66 bpm     Resting BP  118/78     Resting Oxygen Saturation   99 %     Exercise Oxygen Saturation  during 6 min walk  98 %     Max Ex. HR  81 bpm     Max Ex. BP  124/82     2 Minute Post BP  130/74        Oxygen Initial Assessment:   Oxygen Re-Evaluation:   Oxygen Discharge (Final Oxygen Re-Evaluation):   Initial Exercise Prescription: Initial Exercise Prescription - 08/07/18 1100      Date of Initial Exercise RX and Referring Provider   Date  08/07/18    Referring Provider  Dr. Ellyn Hack    Expected Discharge Date  11/12/18      Recumbant Bike   Level  0.7    Minutes  10    METs  2.47      NuStep   Level  2    SPM  75    Minutes  10    METs  2.3      Track   Laps  9    Minutes  10    METs  2.53      Prescription Details   Frequency (times per week)  3    Duration  Progress to 30 minutes of continuous aerobic without signs/symptoms  of physical distress      Intensity   THRR 40-80% of Max Heartrate   60-119    Ratings of Perceived Exertion  11-13      Progression   Progression  Continue to progress workloads to maintain intensity without signs/symptoms of physical distress.      Resistance Training   Training Prescription  Yes    Weight  3 lbs.     Reps  10-15       Perform Capillary Blood Glucose checks as needed.  Exercise Prescription Changes: Exercise Prescription Changes    Row Name 08/13/18 1515 08/20/18 1511 09/01/18 1451 09/29/18 1448       Response to Exercise   Blood Pressure (Admit)  122/70  108/72  122/68  122/70    Blood Pressure (Exercise)  132/80  140/72  138/80  140/70    Blood Pressure (Exit)  118/70  108/74  118/76  110/80    Heart Rate (Admit)  66 bpm  83 bpm  95 bpm  80 bpm    Heart Rate (Exercise)  99 bpm  96 bpm  103 bpm  108 bpm    Heart Rate (Exit)  71 bpm  83 bpm  74 bpm  72 bpm    Rating of Perceived Exertion (Exercise)  _0 Symptoms  none  none  none  none    Duration  Progress to 30 minutes of  aerobic without signs/symptoms of physical distress  Progress to 30 minutes of  aerobic without signs/symptoms of physical distress  Progress to 30 minutes of  aerobic without signs/symptoms of physical distress  Progress to 30 minutes of  aerobic without signs/symptoms of physical distress    Intensity  THRR unchanged  THRR unchanged  THRR unchanged  THRR unchanged      Progression   Progression  Continue to progress workloads to maintain intensity without signs/symptoms of physical distress.  Continue to progress workloads to maintain intensity without signs/symptoms of physical distress.  Continue to progress workloads to maintain intensity without signs/symptoms of physical distress.  Continue to progress workloads to maintain intensity without signs/symptoms of physical distress.    Average METs  2.2  2.7  2.8  2.7      Resistance Training   Training Prescription  No Relaxation day, no weights.  No Relaxation day, no weights.  Yes  Yes     Weight  -  -  5lbs  5lbs    Reps  -  -  10-15  10-15    Time  -  -  10 Minutes  10 Minutes      Interval Training   Interval Training  No  No  No  No      Bike   Level  0.7  0.9  0.9  0.9    Minutes  _1 METs  2.47  2.9  2.91  2.93      Recumbant Bike   Level  -  -  -  -    Minutes  -  -  -  -    METs  -  -  -  -      NuStep   Level  _2 SPM  75  75  75  85    Minutes  _3 10  METs  2.2  2.5  2.7  2.7      Track   Laps  _0 Minutes  10  10  -  7    METs  2  2.57  -  2.49      Home Exercise Plan   Plans to continue exercise at  -  Home (comment) Walking  Home (comment) Walking  Home (comment) Walking    Frequency  -  Add 1 additional day to program exercise sessions.  Add 1 additional day to program exercise sessions.  Add 1 additional day to program exercise sessions.    Initial Home Exercises Provided  -  08/20/18  08/20/18  08/20/18       Exercise Comments: Exercise Comments    Row Name 08/13/18 1630 08/20/18 1534 09/01/18 1545 09/29/18 1509     Exercise Comments  Patient tolerated low intensity exercise without c/o. Will progress workloads as tolerated.  Reviewed home exercise guidelines, METs, and goals with patient.  Reviewed METs with patient.  Reviewed METs and goals with patient. Patient states that he is tolerating exercise better since having stents placed on 09/18/2018.       Exercise Goals and Review: Exercise Goals    Row Name 08/07/18 1120             Exercise Goals   Increase Physical Activity  Yes       Intervention  Provide advice, education, support and counseling about physical activity/exercise needs.;Develop an individualized exercise prescription for aerobic and resistive training based on initial evaluation findings, risk stratification, comorbidities and participant's personal goals.       Expected Outcomes  Short Term: Attend rehab on a regular basis to increase amount of physical activity.        Increase Strength and Stamina  Yes       Intervention  Provide advice, education, support and counseling about physical activity/exercise needs.;Develop an individualized exercise prescription for aerobic and resistive training based on initial evaluation findings, risk stratification, comorbidities and participant's personal goals.       Expected Outcomes  Short Term: Increase workloads from initial exercise prescription for resistance, speed, and METs.       Able to understand and use rate of perceived exertion (RPE) scale  Yes       Intervention  Provide education and explanation on how to use RPE scale       Expected Outcomes  Short Term: Able to use RPE daily in rehab to express subjective intensity level;Long Term:  Able to use RPE to guide intensity level when exercising independently       Knowledge and understanding of Target Heart Rate Range (THRR)  Yes       Intervention  Provide education and explanation of THRR including how the numbers were predicted and where they are located for reference       Expected Outcomes  Short Term: Able to state/look up THRR;Long Term: Able to use THRR to govern intensity when exercising independently;Short Term: Able to use daily as guideline for intensity in rehab       Able to check pulse independently  Yes       Intervention  Provide education and demonstration on how to check pulse in carotid and radial arteries.;Review the importance of being able to check your own pulse for safety during independent exercise       Expected Outcomes  Short Term: Able to explain  why pulse checking is important during independent exercise;Long Term: Able to check pulse independently and accurately       Understanding of Exercise Prescription  Yes       Intervention  Provide education, explanation, and written materials on patient's individual exercise prescription       Expected Outcomes  Short Term: Able to explain program exercise prescription;Long Term: Able to  explain home exercise prescription to exercise independently          Exercise Goals Re-Evaluation : Exercise Goals Re-Evaluation    Row Name 08/13/18 1603 08/20/18 1534 09/29/18 1509         Exercise Goal Re-Evaluation   Exercise Goals Review  Increase Physical Activity;Able to understand and use rate of perceived exertion (RPE) scale  Increase Physical Activity;Able to understand and use rate of perceived exertion (RPE) scale;Knowledge and understanding of Target Heart Rate Range (THRR);Understanding of Exercise Prescription;Increase Strength and Stamina  Increase Physical Activity;Able to understand and use rate of perceived exertion (RPE) scale;Knowledge and understanding of Target Heart Rate Range (THRR);Understanding of Exercise Prescription;Increase Strength and Stamina     Comments  Patient able to understand and use RPE scale appropriately.   Reviewed home exercise guidelines with patient including THRR, RPE scale, and endpoints for exercise. Pt has treadmill, bike, and weights at home, but is not currenlty exercising. Pt states he needs the motivation to exercise.  Patient states that he is tolerating exercise better since having stents placed on 09/18/2018. Pt states he's able to exercise 10 minutes on NuStep without taking rest breaks like he previously had to. Pt still needing to rest on stationary bike.      Expected Outcomes  Increase workloads as tolerated to help achieve personal health and fitness goals.  Pt will walk at least one day at home in addition to exercise at cardiac rehab. Increase workloads at CR to help build confidence and motivation for exercise.  Increase workloads as tolerated to help build strength and stamina.        Discharge Exercise Prescription (Final Exercise Prescription Changes): Exercise Prescription Changes - 09/29/18 1448      Response to Exercise   Blood Pressure (Admit)  122/70    Blood Pressure (Exercise)  140/70    Blood Pressure (Exit)  110/80     Heart Rate (Admit)  80 bpm    Heart Rate (Exercise)  108 bpm    Heart Rate (Exit)  72 bpm    Rating of Perceived Exertion (Exercise)  13    Symptoms  none    Duration  Progress to 30 minutes of  aerobic without signs/symptoms of physical distress    Intensity  THRR unchanged      Progression   Progression  Continue to progress workloads to maintain intensity without signs/symptoms of physical distress.    Average METs  2.7      Resistance Training   Training Prescription  Yes    Weight  5lbs    Reps  10-15    Time  10 Minutes      Interval Training   Interval Training  No      Bike   Level  0.9    Minutes  10    METs  2.93      NuStep   Level  3    SPM  85    Minutes  10    METs  2.7      Track   Laps  6  Minutes  7    METs  2.49      Home Exercise Plan   Plans to continue exercise at  Home (comment)   Walking   Frequency  Add 1 additional day to program exercise sessions.    Initial Home Exercises Provided  08/20/18       Nutrition:  Target Goals: Understanding of nutrition guidelines, daily intake of sodium <1552m, cholesterol <2079m calories 30% from fat and 7% or less from saturated fats, daily to have 5 or more servings of fruits and vegetables.  Biometrics: Pre Biometrics - 08/07/18 1120      Pre Biometrics   Height  5' 11.5" (1.816 m)    Weight  89.6 kg    Waist Circumference  38.5 inches    Hip Circumference  41.4 inches    Waist to Hip Ratio  0.93 %    BMI (Calculated)  27.17    Triceps Skinfold  20 mm    % Body Fat  27.6 %    Grip Strength  35 kg    Flexibility  7 in    Single Leg Stand  5.31 seconds        Nutrition Therapy Plan and Nutrition Goals: Nutrition Therapy & Goals - 08/07/18 1103      Nutrition Therapy   Diet  heart healthy, diabetic      Personal Nutrition Goals   Nutrition Goal  Pt to identify and limit food sources of saturated fat, trans fat, refined carbohydrates and sodium    Personal Goal #2  Pt to  describe the benefit of including fruits, vegetables, whole grains, and low-fat dairy products in a heart healthy meal plan.    Personal Goal #3  Pt able to name foods that affect blood glucose.      Intervention Plan   Intervention  Prescribe, educate and counsel regarding individualized specific dietary modifications aiming towards targeted core components such as weight, hypertension, lipid management, diabetes, heart failure and other comorbidities.    Expected Outcomes  Short Term Goal: Understand basic principles of dietary content, such as calories, fat, sodium, cholesterol and nutrients.;Long Term Goal: Adherence to prescribed nutrition plan.       Nutrition Assessments: Nutrition Assessments - 08/07/18 1104      MEDFICTS Scores   Pre Score  30       Nutrition Goals Re-Evaluation:   Nutrition Goals Re-Evaluation:   Nutrition Goals Discharge (Final Nutrition Goals Re-Evaluation):   Psychosocial: Target Goals: Acknowledge presence or absence of significant depression and/or stress, maximize coping skills, provide positive support system. Participant is able to verbalize types and ability to use techniques and skills needed for reducing stress and depression.  Initial Review & Psychosocial Screening: Initial Psych Review & Screening - 08/07/18 1216      Initial Review   Current issues with  None Identified      Family Dynamics   Good Support System?  Yes   Brandon Vasquez his wife for support   Comments  Brandon Browaid he is having some marital problems      Barriers   Psychosocial barriers to participate in program  The patient should benefit from training in stress management and relaxation.      Screening Interventions   Interventions  Encouraged to exercise;To provide support and resources with identified psychosocial needs    Expected Outcomes  Long Term Goal: Stressors or current issues are controlled or eliminated.       Quality of Life Scores: Quality  of Life -  08/07/18 1109      Quality of Life   Select  Quality of Life      Quality of Life Scores   Health/Function Pre  22.6 %    Socioeconomic Pre  21.86 %    Psych/Spiritual Pre  24.21 %    Family Pre  20.4 %    GLOBAL Pre  22.46 %      Scores of 19 and below usually indicate a poorer quality of life in these areas.  A difference of  2-3 points is a clinically meaningful difference.  A difference of 2-3 points in the total score of the Quality of Life Index has been associated with significant improvement in overall quality of life, self-image, physical symptoms, and general health in studies assessing change in quality of life.  PHQ-9: Recent Review Flowsheet Data    Depression screen [facility] 2/9 08/13/2018   Decreased Interest 0   Down, Depressed, Hopeless 0   PHQ - 2 Score 0     Interpretation of Total Score  Total Score Depression Severity:  1-4 = Minimal depression, 5-9 = Mild depression, 10-14 = Moderate depression, 15-19 = Moderately severe depression, 20-27 = Severe depression   Psychosocial Evaluation and Intervention:   Psychosocial Re-Evaluation: Psychosocial Re-Evaluation    [place] Name 09/11/18 1357 10/09/18 1521           Psychosocial Re-Evaluation   Current issues with  Current Stress Concerns  Current Stress Concerns      Comments  Brandon Vasquez has not voiced any further increased stressors will continue to monitor the patient.  Brandon Vasquez has not voiced any further increased stressors will continue to monitor the patient.      Interventions  Stress management education  Stress management education      Continue Psychosocial Services   No Follow up required  No Follow up required        Initial Review   Source of Stress Concerns  Chronic Illness  Chronic Illness         Psychosocial Discharge (Final Psychosocial Re-Evaluation): Psychosocial Re-Evaluation - 10/09/18 1521      Psychosocial Re-Evaluation   Current issues with  Current Stress Concerns    Comments  Brandon Vasquez has  not voiced any further increased stressors will continue to monitor the patient.    Interventions  Stress management education    Continue Psychosocial Services   No Follow up required      Initial Review   Source of Stress Concerns  Chronic Illness       Vocational Rehabilitation: Provide vocational rehab assistance to qualifying candidates.   Vocational Rehab Evaluation & Intervention: Vocational Rehab - 08/07/18 1218      Initial Vocational Rehab Evaluation & Intervention   Assessment shows need for Vocational Rehabilitation  No   Brandon Vasquez is retired and does not need voational rehab at this time      Education: Education Goals: Education classes will be provided on a weekly basis, covering required topics. Participant will state understanding/return demonstration of topics presented.  Learning Barriers/Preferences: Learning Barriers/Preferences - 08/07/18 1122      Learning Barriers/Preferences   Learning Barriers  Sight    Learning Preferences  Individual Instruction;Video;Skilled Demonstration;Pictoral;Group Instruction       Education Topics: Count Your Pulse:  -Group instruction provided by verbal instruction, demonstration, patient participation and written materials to support subject.  Instructors address importance of being able to find your pulse and how to count your pulse  when at home without a heart monitor.  Patients get hands on experience counting their pulse with staff help and individually.   Heart Attack, Angina, and Risk Factor Modification:  -Group instruction provided by verbal instruction, video, and written materials to support subject.  Instructors address signs and symptoms of angina and heart attacks.    Also discuss risk factors for heart disease and how to make changes to improve heart health risk factors.   CARDIAC REHAB PHASE II EXERCISE from 09/10/2018 in [place]  Date  08/13/18  Instruction Review Code  2-  Demonstrated Understanding      Functional Fitness:  -Group instruction provided by verbal instruction, demonstration, patient participation, and written materials to support subject.  Instructors address safety measures for doing things around the house.  Discuss how to get up and down off the floor, how to pick things up properly, how to safely get out of a chair without assistance, and balance training.   Meditation and Mindfulness:  -Group instruction provided by verbal instruction, patient participation, and written materials to support subject.  Instructor addresses importance of mindfulness and meditation practice to help reduce stress and improve awareness.  Instructor also leads participants through a meditation exercise.    Stretching for Flexibility and Mobility:  -Group instruction provided by verbal instruction, patient participation, and written materials to support subject.  Instructors lead participants through series of stretches that are designed to increase flexibility thus improving mobility.  These stretches are additional exercise for major muscle groups that are typically performed during regular warm up and cool down.   Hands Only CPR:  -Group verbal, video, and participation provides a basic overview of AHA guidelines for community CPR. Role-play of emergencies allow participants the opportunity to practice calling for help and chest compression technique with discussion of AED use.   Hypertension: -Group verbal and written instruction that provides a basic overview of hypertension including the most recent diagnostic guidelines, risk factor reduction with self-care instructions and medication management.    Nutrition I class: Heart Healthy Eating:  -Group instruction provided by PowerPoint slides, verbal discussion, and written materials to support subject matter. The instructor gives an explanation and review of the Therapeutic Lifestyle Changes diet  recommendations, which includes a discussion on lipid goals, dietary fat, sodium, fiber, plant stanol/sterol esters, sugar, and the components of a well-balanced, healthy diet.   Nutrition II class: Lifestyle Skills:  -Group instruction provided by PowerPoint slides, verbal discussion, and written materials to support subject matter. The instructor gives an explanation and review of label reading, grocery shopping for heart health, heart healthy recipe modifications, and ways to make healthier choices when eating out.   Diabetes Question & Answer:  -Group instruction provided by PowerPoint slides, verbal discussion, and written materials to support subject matter. The instructor gives an explanation and review of diabetes co-morbidities, pre- and post-prandial blood glucose goals, pre-exercise blood glucose goals, signs, symptoms, and treatment of hypoglycemia and hyperglycemia, and foot care basics.   Diabetes Blitz:  -Group instruction provided by PowerPoint slides, verbal discussion, and written materials to support subject matter. The instructor gives an explanation and review of the physiology behind type 1 and type 2 diabetes, diabetes medications and rational behind using different medications, pre- and post-prandial blood glucose recommendations and Hemoglobin A1c goals, diabetes diet, and exercise including blood glucose guidelines for exercising safely.    Portion Distortion:  -Group instruction provided by PowerPoint slides, verbal discussion, written materials, and food models  to support subject matter. The instructor gives an explanation of serving size versus portion size, changes in portions sizes over the last 20 years, and what consists of a serving from each food group.   Stress Management:  -Group instruction provided by verbal instruction, video, and written materials to support subject matter.  Instructors review role of stress in heart disease and how to cope with stress  positively.     Exercising on Your Own:  -Group instruction provided by verbal instruction, power point, and written materials to support subject.  Instructors discuss benefits of exercise, components of exercise, frequency and intensity of exercise, and end points for exercise.  Also discuss use of nitroglycerin and activating EMS.  Review options of places to exercise outside of rehab.  Review guidelines for sex with heart disease.   CARDIAC REHAB PHASE II EXERCISE from 09/10/2018 in [place]  Date  08/20/18  Educator  EP  Instruction Review Code  2- Demonstrated Understanding      Cardiac Drugs I:  -Group instruction provided by verbal instruction and written materials to support subject.  Instructor reviews cardiac drug classes: antiplatelets, anticoagulants, beta blockers, and statins.  Instructor discusses reasons, side effects, and lifestyle considerations for each drug class.   CARDIAC REHAB PHASE II EXERCISE from 09/10/2018 in [place]  Date  09/10/18  Educator  Pharmacist  Instruction Review Code  2- Demonstrated Understanding      Cardiac Drugs II:  -Group instruction provided by verbal instruction and written materials to support subject.  Instructor reviews cardiac drug classes: angiotensin converting enzyme inhibitors (ACE-I), angiotensin II receptor blockers (ARBs), nitrates, and calcium channel blockers.  Instructor discusses reasons, side effects, and lifestyle considerations for each drug class.   Anatomy and Physiology of the Circulatory System:  Group verbal and written instruction and models provide basic cardiac anatomy and physiology, with the coronary electrical and arterial systems. Review of: AMI, Angina, Valve disease, Heart Failure, Peripheral Artery Disease, Cardiac Arrhythmia, Pacemakers, and the ICD.   Other Education:  -Group or individual verbal, written, or video instructions that support  the educational goals of the cardiac rehab program.   Holiday Eating Survival Tips:  -Group instruction provided by PowerPoint slides, verbal discussion, and written materials to support subject matter. The instructor gives patients tips, tricks, and techniques to help them not only survive but enjoy the holidays despite the onslaught of food that accompanies the holidays.   Knowledge Questionnaire Score: Knowledge Questionnaire Score - 08/07/18 1106      Knowledge Questionnaire Score   Pre Score  20/24       Core Components/Risk Factors/Patient Goals at Admission: Personal Goals and Risk Factors at Admission - 08/07/18 1218      Core Components/Risk Factors/Patient Goals on Admission    Weight Management  Yes;Weight Maintenance;Weight Loss    Intervention  Weight Management: Develop a combined nutrition and exercise program designed to reach desired caloric intake, while maintaining appropriate intake of nutrient and fiber, sodium and fats, and appropriate energy expenditure required for the weight goal.;Weight Management: Provide education and appropriate resources to help participant work on and attain dietary goals.;Weight Management/Obesity: Establish reasonable short term and long term weight goals.    Admit Weight  197 lb 8.5 oz (89.6 kg)    Expected Outcomes  Short Term: Continue to assess and modify interventions until short term weight is achieved;Long Term: Adherence to nutrition and physical activity/exercise program aimed toward attainment of  established weight goal;Weight Maintenance: Understanding of the daily nutrition guidelines, which includes 25-35% calories from fat, 7% or less cal from saturated fats, less than 251m cholesterol, less than 1.5gm of sodium, & 5 or more servings of fruits and vegetables daily;Weight Loss: Understanding of general recommendations for a balanced deficit meal plan, which promotes 1-2 lb weight loss per week and includes a negative energy balance  of [id number] kcal/d;Understanding recommendations for meals to include 15-35% energy as protein, 25-35% energy from fat, 35-60% energy from carbohydrates, less than 2087mof dietary cholesterol, 20-35 gm of total fiber daily;Understanding of distribution of calorie intake throughout the day with the consumption of 4-5 meals/snacks    Diabetes  Yes    Intervention  Provide education about signs/symptoms and action to take for hypo/hyperglycemia.;Provide education about proper nutrition, including hydration, and aerobic/resistive exercise prescription along with prescribed medications to achieve blood glucose in normal ranges: Fasting glucose 65-99 mg/dL    Expected Outcomes  Short Term: Participant verbalizes understanding of the signs/symptoms and immediate care of hyper/hypoglycemia, proper foot care and importance of medication, aerobic/resistive exercise and nutrition plan for blood glucose control.;Long Term: Attainment of HbA1C < 7%.    Hypertension  Yes    Intervention  Provide education on lifestyle modifcations including regular physical activity/exercise, weight management, moderate sodium restriction and increased consumption of fresh fruit, vegetables, and low fat dairy, alcohol moderation, and smoking cessation.;Monitor prescription use compliance.    Expected Outcomes  Short Term: Continued assessment and intervention until BP is < 140/9038mG in hypertensive participants. < 130/32m9m in hypertensive participants with diabetes, heart failure or chronic kidney disease.;Long Term: Maintenance of blood pressure at goal levels.    Lipids  Yes    Intervention  Provide education and support for participant on nutrition & aerobic/resistive exercise along with prescribed medications to achieve LDL <70mg54mL >40mg.36mExpected Outcomes  Short Term: Participant states understanding of desired cholesterol values and is compliant with medications prescribed. Participant is following exercise prescription  and nutrition guidelines.;Long Term: Cholesterol controlled with medications as prescribed, with individualized exercise RX and with personalized nutrition plan. Value goals: LDL < 70mg, 66m> 40 mg.    Stress  Yes    Intervention  Offer individual and/or small group education and counseling on adjustment to heart disease, stress management and health-related lifestyle change. Teach and support self-help strategies.;Refer participants experiencing significant psychosocial distress to appropriate mental health specialists for further evaluation and treatment. When possible, include family members and significant others in education/counseling sessions.    Expected Outcomes  Short Term: Participant demonstrates changes in health-related behavior, relaxation and other stress management skills, ability to obtain effective social support, and compliance with psychotropic medications if prescribed.;Long Term: Emotional wellbeing is indicated by absence of clinically significant psychosocial distress or social isolation.       Core Components/Risk Factors/Patient Goals Review:  Goals and Risk Factor Review    Row Name 08/13/18 1633 09/11/18 1359 10/09/18 1521         Core Components/Risk Factors/Patient Goals Review   Personal Goals Review  Weight Management/Obesity;Hypertension;Diabetes  Weight Management/Obesity;Hypertension;Diabetes  Weight Management/Obesity;Hypertension;Diabetes     Review  Brandon Vasquez exercise at cardiac rehab today  Brandon Vasquez doing fair with exercise Brandon's vital signs have been stable. Brandon Vasquez been able to increase his work loads due to his CAD  Brandon Vasquez doing better with exercise Brandon's vital signs have been stable. Brandon Vasquez doing well since  his recent stenting.     Expected Outcomes  Brandon Vasquez will continue to participate in phase 2 cardiac rehab for exercise nutrtion and lifestyle modfications  Brandon Vasquez will continue to participate in phase 2 cardiac rehab for  exercise nutrtion and lifestyle modfications  Brandon Vasquez will continue to participate in phase 2 cardiac rehab for exercise nutrtion and lifestyle modfications        Core Components/Risk Factors/Patient Goals at Discharge (Final Review):  Goals and Risk Factor Review - 10/09/18 1521      Core Components/Risk Factors/Patient Goals Review   Personal Goals Review  Weight Management/Obesity;Hypertension;Diabetes    Review  Brandon Vasquez has been doing better with exercise Brandon's vital signs have been stable. Brandon Vasquez has been doing well since his recent stenting.    Expected Outcomes  Brandon Vasquez will continue to participate in phase 2 cardiac rehab for exercise nutrtion and lifestyle modfications       ITP Comments: ITP Comments    Row Name 08/06/18 1608 08/13/18 1631 09/11/18 1356 10/09/18 1521     ITP Comments  Brandon Vasquez, Medical Director   30 Day ITP Review. Brandon Vasquez started cardiac rehab today  30 Day ITP Review. Brandon Vasquez is good attendance and participation in phase 2 cardiac rehab.  30 Day ITP Review. Brandon Vasquez is good attendance and participation in phase 2 cardiac rehab.       Comments: See ITP comments. Brandon Vasquez has been doing well with exercise since his return to cardiac rehab.

## 2018-10-09 NOTE — Progress Notes (Signed)
PCP: Rikki Spearing, NP  Clinic Note: Chief Complaint  Patient presents with  . Hospitalization Follow-up    Post PCI  . Coronary Artery Disease    No further angina post PCI.    HPI: Brandon Vasquez is a 73 y.o. male with a PMH of CAD-nSTEMI/Accelerated HTN -- MV CAD -CABG-PCI.  He returns for 1 month f/u.   He has a history of MULTIVESSEL CORONARY ARTERY DISEASE status post CABG 4 (LIMA-LAD {could be to D1-L, SVG-RPDA, SVG-OM1-OM 2) in 2001 (in [place])--> after presenting with cardiac arrest and emergent catheterization  ? --> NSTEMI IN 04/2016 -> PCI to SVG-RCA, med Rx of Native Cx disease.  ? Myoview stress test November 2018: EF 44%.  Medium size moderate severity fixed defect in the basal inferolateral mid inferolateral wall consistent with prior infarct with mild peri-infarct ischemia.  LOW RISK. ? Progressive Angina - 05/08/2018: Cath - ISR SVG-RCA =-> DES PCI & PTCA; non-revascularized Native Cx - ostial 90% (mild progression) --> subsequently failed medical management. ? Myoview November 2019: EF 48%.  Small inferior-lateral wall infarct apex to base with septal thinning at base.  Thought to be consistent with prior MI but no ischemia.  ? (On my review, this appeared to have clear evidence of at least mild ischemia)  He also has hypertension, hyperlipidemia and type 2 diabetes mellitus - Was converted from lisinopril to amlodipine plus HCTZ for hypotension/dizziness  SURAFEL HILLEARY was last seen by Almyra Deforest, PA on 08/08/2018 - discussed results of Myoview (ordered after visit with me on 10/14/'19) --> increased Imdur & added Ranexa 500 mg BID -->. I saw him in return on January 10, he continued to note having at least class II if not 3 anginal symptoms with a burning sensation in his chest.  I therefore decided that the abnormal stress test with mild ischemia was probably more consistent with true ischemia than prior infarct along.  We chose to proceed peace with PCI -with  atherectomy of circumflex.  Recent Hospitalizations: None  Studies Personally Reviewed - (if available, images/films reviewed: From Epic Chart or Care Everywhere)  09/19/2018: PCI LM-Cx (CSI, OCT) 0-- 2 overlapping DES Orsiro 3.5 x 22; 3.0 x 30    Interval History: Brandon Vasquez returns today in great spirits.  He says all the chest discomfort and burning is now gone.  He is back to doing his full exercise regimen with cardiac rehab and feeling wonderful.  He says that he actually is noted feeling better at the time of PCI, and then was extremely happy with need for start of walking. He has not had any further exertional chest tightness/burning that was consistent with his angina.  No exertional dyspnea.  No resting dyspnea.  No PND, orthopnea or edema. No rapid irregular heartbeats palpitations.  No syncope/near syncope or TIA/amaurosis fugax.  His hemoglobin level has been a little low, but stable.  No melena, hematochezia or hematuria.  He is on iron supplementation. He is due for routine surveillance colonoscopy, and the hope was to be getting this done this year.  There was questions about when he would be able to stop/hold Brilinta.  No claudication.  ROS: A comprehensive was performed. Review of Systems  Constitutional: Negative for malaise/fatigue and weight loss.  HENT: Negative for congestion and nosebleeds.   Respiratory: Negative for cough and shortness of breath.   Cardiovascular: Negative for chest pain.  Gastrointestinal: Negative for blood in stool and melena.  Genitourinary: Negative for hematuria.  Musculoskeletal: Negative for joint pain.  Neurological: Negative for dizziness, focal weakness and weakness.  Endo/Heme/Allergies: Does not bruise/bleed easily.  Psychiatric/Behavioral: Negative.   All other systems reviewed and are negative.   I have reviewed and (if needed) personally updated the patient's problem list, medications, allergies, past medical and surgical history,  social and family history.   Past Medical History:  Diagnosis Date  . Coronary artery disease involving native heart with angina pectoris (HCC) 2005   a. 2005: CABG x5V in [place]  b. 04/2016: PCI/DES to SVG--> RCA. Severe Ost LCx (with CTO of grafted OMs - no retrograde filling) - distal Cx unprotected; RCA & LAD essentially CTO.;; 05/2018 - SVG-RCA ostial ISR with post-stent 80% - Overlapping DES & high Atm post-dilation of ISR. Progression of Native LM-ostLCx; 1/17/'20 - CSI - DES PCI dLM-ProxCx  2 overlapping DES  . Essential hypertension   . HCV (hepatitis C virus)    "treated" (05/08/2018)  . HLD (hyperlipidemia)   . Hx of CABG 2001   [place] - CABG 4: LIMA-LAD, SeqSVG-OM1-OM2,SVG-rPDA  . MI (myocardial infarction) (HCC) 2001  . Non-ST elevation MI (NSTEMI) (HCC) 04/2016   Culprit lesion was 90% SVG-RPDA -> DES PCI. Also noted 80% ostCx treated medically (subsequent redo PCI of SVG-RCA for ISR-sequential lesion 9/'29, follwed by Staged Atherecotmy-PCI of L-pCx in1/2020)  . Type 2 diabetes mellitus with complication (HCC)    CAD    Past Surgical History:  Procedure Laterality Date  . CARDIAC CATHETERIZATION N/A 04/23/2016   Procedure: Left Heart Cath and Cors/Grafts Angiography;  Surgeon: Jayadeep S Varanasi, MD;  Location: [facility];  Service: Cardiovascular: ostLAD 75%->pLAD 100%, ostOM1 & OM2 100%, pRCA 80% -> dRCA 50%; LIMA-LAD patent, SVG-OM1-OM2 ~20% prox otw patentt, ost-pSVG-rPDA 90% --> PCI  . CARDIAC CATHETERIZATION N/A 04/23/2016   Procedure: Coronary Stent Intervention;  Surgeon: Jayadeep S Varanasi, MD;  Location: [facility];  Service: Cardiovascular;  Laterality: N/A;  DES PCI - 90% pSVG- PDA (Synergy DES 3.5 x 20)  . CORONARY ARTERY BYPASS GRAFT  2001   LIMA-LAD, SeqSVG-OM1-OM2,SVG-rPDA  . CORONARY ATHERECTOMY N/A 09/18/2018   Procedure: CORONARY ATHERECTOMY (ORBITAL);  Surgeon: Harding, [name] W, MD;  Location: [facility];  Service: Cardiovascular:  dLM-OstCx CSI -OCT - PCI - OCT.   . CORONARY STENT INTERVENTION N/A 05/08/2018   Procedure: CORONARY STENT INTERVENTION;  Surgeon: Harding, [name] W, MD;  Location: [facility];  Service: Cardiovascular;; Ost SVG-RPDA-PL 70% ISR & 75-80% just beyond stent --> Synergy DES 3.5 x 12 (overlapping) with 4.0 mm post-dilation of overlap & entire old stent (especially the Ostum)  . CORONARY STENT INTERVENTION N/A 09/18/2018   Procedure: CORONARY STENT INTERVENTION;  Surgeon: Harding, [name] W, MD;  Location: [facility];  Service: Cardiovascular: 2 overlapping DES stent placement (Osiro 3.0 x 32 mm & 3.5 x 23 mm -postdilated and tapered fashion from 4.1 to 2.8 mm)  . INTRAVASCULAR ULTRASOUND/IVUS N/A 09/18/2018   Procedure: Intravascular OCT;  Surgeon: Harding, [name] W, MD;  Location: [facility];  Service: Cardiovascular: Post CSI Atherectomy OCT identified 2 additional downstream lesions for PCI; post PCI with overlapping DES OCT used to ensure stent apposition /expansion. -- small plaque thrombus extruding through stent @ most distal lesion -- IV Heparin overnight   . LAPAROSCOPIC CHOLECYSTECTOMY    . LEFT HEART CATH AND CORS/GRAFTS ANGIOGRAPHY N/A 05/08/2018   Procedure: LEFT HEART CATH AND CORS/GRAFTS ANGIOGRAPHY;  Surgeon: Harding, [name] W, MD;    Location: [facility];  Service: Cardiovascular;; SVG-RPDA-PL 70% ostial ISR & 75% aftter stent; Progression of dLM-OstLCx to ~90-95%.-> PCI of SVG-RPDA-PL. RCA & LAD CTO.  Patent SeqSVG-OM1-OM2 (native OMs ostial CTO - no retrograde flow), Patent LIMA-mLAD  . NM MYOVIEW LTD  07/2018   Myoview 07/15/2018:  EF 45-50%. Small size fixed  inferolateral wall defect c/w prior MI. NO ischemia.  LOW RISK  . TRANSTHORACIC ECHOCARDIOGRAM  04/2016   EF 60-65%. GR 2 DD. Inferior hypokinesis. No significant valvular lesions    Cath-PC 05/2018:   In-stent restenosis followed by post-stent severe stenosis in the SVG-RPDA-RPL -> successful PTCA of in-stent  restenosis with a second Synergy DES (3.5 mm x 12 mm) placed to cover the distal edge lesion.  Progression of proximal RCA, distal Left Main and ostial Circumflex disease.  Otherwise patent LIMA-LAD and SVG-OM1-OM 2.  09/19/2018: PCI LM-Cx (CSI, OCT) 0-- 2 overlapping DES Orsiro 3.5 x 22; 3.0 x 30    Current Meds  Medication Sig  . amLODipine (NORVASC) 10 MG tablet Take 1 tablet (10 mg total) by mouth daily.  Marland Kitchen aspirin 81 MG EC tablet Take 81 mg by mouth daily. Swallow whole.  . ezetimibe (ZETIA) 10 MG tablet Take 1 tablet (10 mg total) by mouth daily.  . Hypromellose (ARTIFICIAL TEARS OP) Apply 1-2 drops to eye daily as needed (dry eyes).  . isosorbide mononitrate (IMDUR) 60 MG 24 hr tablet Take 1 tablet (60 mg total) by mouth daily.  Marland Kitchen losartan (COZAAR) 25 MG tablet Take 1 tablet (25 mg total) by mouth daily.  . metFORMIN (GLUCOPHAGE-XR) 500 MG 24 hr tablet Take 1 tablet (500 mg total) by mouth daily.  . metoprolol succinate (TOPROL-XL) 50 MG 24 hr tablet Take 1 tablet (50 mg total) by mouth daily. Take with or immediately following a meal.  . nitroGLYCERIN (NITROSTAT) 0.4 MG SL tablet Place 1 tablet (0.4 mg total) under the tongue every 5 (five) minutes x 3 doses as needed for chest pain.  . ranolazine (RANEXA) 500 MG 12 hr tablet Take 1 tablet (500 mg total) by mouth 2 (two) times daily.  . rosuvastatin (CRESTOR) 40 MG tablet Take 1 tablet (40 mg total) by mouth daily.  . ticagrelor (BRILINTA) 90 MG TABS tablet Take 1 tablet (90 mg total) by mouth 2 (two) times daily.    No Known Allergies  Social History   Tobacco Use  . Smoking status: Former Smoker    Packs/day: 1.00    Years: 30.00    Pack years: 30.00    Types: Cigarettes    Last attempt to quit: 07/10/1996    Years since quitting: 22.2  . Smokeless tobacco: Never Used  Substance Use Topics  . Alcohol use: Yes    Comment: 05/08/2018 "couple drinks/yyear"  . Drug use: Not Currently    Types: Marijuana    Comment:  05/08/2018 "nothing in 2019"   Social History   Social History Narrative  . Not on file   Family History family history includes Alzheimer's disease in his father; Cerebral aneurysm in his mother; Hypertension in his mother.  Wt Readings from Last 3 Encounters:  10/09/18 193 lb (87.5 kg)  09/19/18 188 lb 4.4 oz (85.4 kg)  09/12/18 194 lb 3.2 oz (88.1 kg)    PHYSICAL EXAM BP 124/64   Pulse 60   Ht 6' 1" (1.854 m)   Wt 193 lb (87.5 kg)   BMI 25.46 kg/m  Physical Exam  Constitutional: He is  oriented to person, place, and time. He appears well-developed and well-nourished. No distress.  Well-groomed.  Healthy-appearing.  HENT:  Head: Normocephalic and atraumatic.  Mouth/Throat: Oropharynx is clear and moist.  Neck: Normal range of motion. Neck supple. No hepatojugular reflux and no JVD present. Carotid bruit is not present. No tracheal deviation present. No thyromegaly present.  Cardiovascular: Normal rate, regular rhythm, normal heart sounds, intact distal pulses and normal pulses.  No extrasystoles are present. PMI is not displaced. Exam reveals no gallop and no friction rub.  No murmur heard. Pulmonary/Chest: Effort normal and breath sounds normal. No respiratory distress. He has no wheezes.  Abdominal: Soft. Bowel sounds are normal. He exhibits no distension. There is no abdominal tenderness. There is no rebound.  Musculoskeletal: Normal range of motion.        General: No edema.  Neurological: He is alert and oriented to person, place, and time.  Psychiatric: He has a normal mood and affect. His behavior is normal. Judgment and thought content normal.  Vitals reviewed.    Adult ECG Report Normal sinus rhythm, rate 60 bpm.  Normal axis, intervals durations.  No ischemic ST and T wave changes.  Other studies Reviewed: Additional studies/ records that were reviewed today include:  Recent Labs:   Lab Results  Component Value Date   CREATININE 1.54 (H) 09/19/2018   BUN 19  09/19/2018   NA 138 09/19/2018   K 3.8 09/19/2018   CL 108 09/19/2018   CO2 22 09/19/2018   Labs rechecked by PCP in November 2019: TC 103, TG 95, LDL 37, HDL 65;  --> in December: Iron 39, ferritin 7.2.  ASSESSMENT / PLAN: Problem List Items Addressed This Visit    Coronary artery disease involving autologous vein coronary bypass graft with angina pectoris (HCC) (Chronic)    Status post redo PCI of SVG-RCA now with overlapping stents in angioplasty of the initial stent in the ostium.  Has patent LIMA to LAD and patent sequential graft to the occluded OM1 and OM 2 branches that do not fill the circumflex retrograde.      Coronary artery disease involving native heart with angina pectoris (HCC) (Chronic)    Essentially occluded LAD and RCA with PCI of left main into circumflex to perfuse the distal circumflex there is not perfused via retrograde flow from the graft to the OM branches.  Is on aspirin plus Brilinta for now post PCI for minimum of 3 months before being able to hold Brilinta. Is on stable dose of Zetia and Crestor as well as amlodipine, metoprolol and losartan.      Relevant Orders   EKG 12-Lead (Completed)   Dyslipidemia, goal LDL below 70 (Chronic)    Recheck lipids in December compared to October look much better.  LDL now well within goal on combination of Zetia plus Crestor.      Essential hypertension (Chronic)    Blood pressure looks great on current dose of amlodipine, Toprol and losartan.      Iron deficiency anemia    Remains on iron supplementation.  Has colonoscopy pending, but would need to have Brilinta held.  Would not want to do this until 3 months post PCI.  This will put us into mid April.  Would be acceptable to hold Brilinta as of April 16 (5 5 7 days), but would be better if he could be on aspirin 81 mg during those days.  If his hemoglobin continues to drop, would simply hold aspirin for now however.        Progressive angina (HCC)    Symptoms  essentially resolved after staged PCI to the distal LM-pCx. For now, will continue Ranexa, but considered stopping either Ranexa or Imdur if symptoms continue to be stable in follow-up. Continue beta-blocker and amlodipine at current dose.      Relevant Orders   EKG 12-Lead (Completed)   S/P angioplasty with stent distal LM and LCX. 09/18/18 - Primary    With recent stent, would need a minimum of 3 months on Brilinta, if he has drop in hemoglobin, would be okay to stop aspirin.  However at 3 months, would be reasonable to hold Brilinta for colonoscopy.    Would hold 5 to 7 days preprocedure (would like to have him take 81 mg during the roughly 1 week while off of Brilinta)         I spent a total of 35-40 minutes with the patient and chart review. >  50% of the time was spent in direct patient consultation.   Current medicines are reviewed at length with the patient today.  (+/- concerns) n/a -did not take Ranexa because of concern about hepatic insufficiency. The following changes have been made:    Patient Instructions  Medication Instructions:   continue  With current medications and after April 15,2020 you may stop taking aspirin 81 mg but continue the Brilinta 90 mg twice a day  If you need a refill on your cardiac medications before your next appointment, please call your pharmacy.   Lab work: Not needed If you have labs (blood work) drawn today and your tests are completely normal, you will receive your results only by: . MyChart Message (if you have MyChart) OR . A paper copy in the mail If you have any lab test that is abnormal or we need to change your treatment, we will call you to review the results.  Testing/Procedures: NOT NEEDED  Follow-Up: At [facility], you and your health needs are our priority.  As part of our continuing mission to provide you with exceptional heart care, we have created designated Provider Care Teams.  These Care Teams include your primary  Cardiologist (physician) and Advanced Practice Providers (APPs -  Physician Assistants and Nurse Practitioners) who all work together to provide you with the care you need, when you need it. Your physician recommends that you schedule a follow-up appointment in 4 months with Dr HARDING  Any Other Special Instructions Will Be Listed Below (If Applicable).   Colonoscopy can be done 3 months from 09/18/18 ,but Preferably would like to wait at least 6 months.   Studies Ordered:   Orders Placed This Encounter  Procedures  . EKG 12-Lead      [name] Harding, M.D., M.S. Interventional Cardiologist   Pager # [phone] Phone # [phone] [address] [place], [place] [address]   Thank you for choosing [facility]!!    

## 2018-10-10 ENCOUNTER — Ambulatory Visit (HOSPITAL_COMMUNITY): Payer: Medicare Other

## 2018-10-10 ENCOUNTER — Encounter (HOSPITAL_COMMUNITY)
Admission: RE | Admit: 2018-10-10 | Discharge: 2018-10-10 | Disposition: A | Discharge status: Home / Self Care | Payer: Medicare Other | Source: Ambulatory Visit | Attending: Cardiology | Admitting: Cardiology

## 2018-10-10 DIAGNOSIS — I214 Non-ST elevation (NSTEMI) myocardial infarction: Secondary | ICD-10-CM | POA: Diagnosis not present

## 2018-10-10 DIAGNOSIS — Z955 Presence of coronary angioplasty implant and graft: Secondary | ICD-10-CM | POA: Diagnosis not present

## 2018-10-10 DIAGNOSIS — E119 Type 2 diabetes mellitus without complications: Secondary | ICD-10-CM | POA: Diagnosis not present

## 2018-10-10 DIAGNOSIS — I10 Essential (primary) hypertension: Secondary | ICD-10-CM | POA: Diagnosis not present

## 2018-10-10 DIAGNOSIS — E785 Hyperlipidemia, unspecified: Secondary | ICD-10-CM | POA: Diagnosis not present

## 2018-10-10 DIAGNOSIS — I25119 Atherosclerotic heart disease of native coronary artery with unspecified angina pectoris: Secondary | ICD-10-CM | POA: Diagnosis not present

## 2018-10-11 ENCOUNTER — Encounter: Payer: Self-pay | Admitting: Cardiology

## 2018-10-11 NOTE — Assessment & Plan Note (Signed)
Remains on iron supplementation.  Has colonoscopy pending, but would need to have Brilinta held.  Would not want to do this until 3 months post PCI.  This will put [place] into mid April.  Would be acceptable to hold Brilinta as of April 16 (5 5 7  days), but would be better if he could be on aspirin 81 mg during those days.  If his hemoglobin continues to drop, would simply hold aspirin for now however.

## 2018-10-11 NOTE — Assessment & Plan Note (Signed)
Recheck lipids in December compared to October look much better.  LDL now well within goal on combination of Zetia plus Crestor.

## 2018-10-11 NOTE — Assessment & Plan Note (Signed)
Blood pressure looks great on current dose of amlodipine, Toprol and losartan.

## 2018-10-11 NOTE — Assessment & Plan Note (Signed)
Status post redo PCI of SVG-RCA now with overlapping stents in angioplasty of the initial stent in the ostium.  Has patent LIMA to LAD and patent sequential graft to the occluded OM1 and OM 2 branches that do not fill the circumflex retrograde.

## 2018-10-11 NOTE — Assessment & Plan Note (Signed)
Symptoms essentially resolved after staged PCI to the distal LM-pCx. For now, will continue Ranexa, but considered stopping either Ranexa or Imdur if symptoms continue to be stable in follow-up. Continue beta-blocker and amlodipine at current dose.

## 2018-10-11 NOTE — Assessment & Plan Note (Signed)
Essentially occluded LAD and RCA with PCI of left main into circumflex to perfuse the distal circumflex there is not perfused via retrograde flow from the graft to the OM branches.  Is on aspirin plus Brilinta for now post PCI for minimum of 3 months before being able to hold Brilinta. Is on stable dose of Zetia and Crestor as well as amlodipine, metoprolol and losartan.

## 2018-10-11 NOTE — Assessment & Plan Note (Signed)
With recent stent, would need a minimum of 3 months on Brilinta, if he has drop in hemoglobin, would be okay to stop aspirin.  However at 3 months, would be reasonable to hold Brilinta for colonoscopy.    Would hold 5 to 7 days preprocedure (would like to have him take 81 mg during the roughly 1 week while off of Brilinta)

## 2018-10-13 ENCOUNTER — Ambulatory Visit (HOSPITAL_COMMUNITY): Payer: Medicare Other

## 2018-10-13 ENCOUNTER — Encounter (HOSPITAL_COMMUNITY)
Admission: RE | Admit: 2018-10-13 | Discharge: 2018-10-13 | Disposition: A | Discharge status: Home / Self Care | Payer: Medicare Other | Source: Ambulatory Visit | Attending: Cardiology | Admitting: Cardiology

## 2018-10-13 ENCOUNTER — Ambulatory Visit: Payer: Medicare Other | Admitting: Cardiology

## 2018-10-13 DIAGNOSIS — I10 Essential (primary) hypertension: Secondary | ICD-10-CM | POA: Diagnosis not present

## 2018-10-13 DIAGNOSIS — Z955 Presence of coronary angioplasty implant and graft: Secondary | ICD-10-CM | POA: Diagnosis not present

## 2018-10-13 DIAGNOSIS — E785 Hyperlipidemia, unspecified: Secondary | ICD-10-CM | POA: Diagnosis not present

## 2018-10-13 DIAGNOSIS — I25119 Atherosclerotic heart disease of native coronary artery with unspecified angina pectoris: Secondary | ICD-10-CM | POA: Diagnosis not present

## 2018-10-13 DIAGNOSIS — E119 Type 2 diabetes mellitus without complications: Secondary | ICD-10-CM | POA: Diagnosis not present

## 2018-10-13 DIAGNOSIS — I214 Non-ST elevation (NSTEMI) myocardial infarction: Secondary | ICD-10-CM | POA: Diagnosis not present

## 2018-10-15 ENCOUNTER — Encounter (HOSPITAL_COMMUNITY)
Admission: RE | Admit: 2018-10-15 | Discharge: 2018-10-15 | Disposition: A | Discharge status: Home / Self Care | Payer: Medicare Other | Source: Ambulatory Visit | Attending: Cardiology | Admitting: Cardiology

## 2018-10-15 ENCOUNTER — Ambulatory Visit (HOSPITAL_COMMUNITY): Payer: Medicare Other

## 2018-10-15 DIAGNOSIS — E785 Hyperlipidemia, unspecified: Secondary | ICD-10-CM | POA: Diagnosis not present

## 2018-10-15 DIAGNOSIS — E119 Type 2 diabetes mellitus without complications: Secondary | ICD-10-CM | POA: Diagnosis not present

## 2018-10-15 DIAGNOSIS — Z955 Presence of coronary angioplasty implant and graft: Secondary | ICD-10-CM | POA: Diagnosis not present

## 2018-10-15 DIAGNOSIS — I10 Essential (primary) hypertension: Secondary | ICD-10-CM | POA: Diagnosis not present

## 2018-10-15 DIAGNOSIS — I25119 Atherosclerotic heart disease of native coronary artery with unspecified angina pectoris: Secondary | ICD-10-CM | POA: Diagnosis not present

## 2018-10-15 DIAGNOSIS — I214 Non-ST elevation (NSTEMI) myocardial infarction: Secondary | ICD-10-CM | POA: Diagnosis not present

## 2018-10-17 ENCOUNTER — Ambulatory Visit (HOSPITAL_COMMUNITY): Payer: Medicare Other

## 2018-10-17 ENCOUNTER — Encounter (HOSPITAL_COMMUNITY)
Admission: RE | Admit: 2018-10-17 | Discharge: 2018-10-17 | Disposition: A | Discharge status: Home / Self Care | Payer: Medicare Other | Source: Ambulatory Visit | Attending: Cardiology | Admitting: Cardiology

## 2018-10-17 DIAGNOSIS — E785 Hyperlipidemia, unspecified: Secondary | ICD-10-CM | POA: Diagnosis not present

## 2018-10-17 DIAGNOSIS — E119 Type 2 diabetes mellitus without complications: Secondary | ICD-10-CM | POA: Diagnosis not present

## 2018-10-17 DIAGNOSIS — I214 Non-ST elevation (NSTEMI) myocardial infarction: Secondary | ICD-10-CM | POA: Diagnosis not present

## 2018-10-17 DIAGNOSIS — I10 Essential (primary) hypertension: Secondary | ICD-10-CM | POA: Diagnosis not present

## 2018-10-17 DIAGNOSIS — I25119 Atherosclerotic heart disease of native coronary artery with unspecified angina pectoris: Secondary | ICD-10-CM | POA: Diagnosis not present

## 2018-10-17 DIAGNOSIS — Z955 Presence of coronary angioplasty implant and graft: Secondary | ICD-10-CM

## 2018-10-20 ENCOUNTER — Encounter (HOSPITAL_COMMUNITY)
Admission: RE | Admit: 2018-10-20 | Discharge: 2018-10-20 | Disposition: A | Discharge status: Home / Self Care | Payer: Medicare Other | Source: Ambulatory Visit | Attending: Cardiology | Admitting: Cardiology

## 2018-10-20 ENCOUNTER — Ambulatory Visit (HOSPITAL_COMMUNITY): Payer: Medicare Other

## 2018-10-20 DIAGNOSIS — Z955 Presence of coronary angioplasty implant and graft: Secondary | ICD-10-CM

## 2018-10-20 DIAGNOSIS — I10 Essential (primary) hypertension: Secondary | ICD-10-CM | POA: Diagnosis not present

## 2018-10-20 DIAGNOSIS — E119 Type 2 diabetes mellitus without complications: Secondary | ICD-10-CM | POA: Diagnosis not present

## 2018-10-20 DIAGNOSIS — I25119 Atherosclerotic heart disease of native coronary artery with unspecified angina pectoris: Secondary | ICD-10-CM | POA: Diagnosis not present

## 2018-10-20 DIAGNOSIS — E785 Hyperlipidemia, unspecified: Secondary | ICD-10-CM | POA: Diagnosis not present

## 2018-10-20 DIAGNOSIS — I214 Non-ST elevation (NSTEMI) myocardial infarction: Secondary | ICD-10-CM | POA: Diagnosis not present

## 2018-10-22 ENCOUNTER — Ambulatory Visit (HOSPITAL_COMMUNITY): Payer: Medicare Other

## 2018-10-22 ENCOUNTER — Encounter (HOSPITAL_COMMUNITY)
Admission: RE | Admit: 2018-10-22 | Discharge: 2018-10-22 | Disposition: A | Discharge status: Home / Self Care | Payer: Medicare Other | Source: Ambulatory Visit | Attending: Cardiology | Admitting: Cardiology

## 2018-10-22 DIAGNOSIS — E119 Type 2 diabetes mellitus without complications: Secondary | ICD-10-CM | POA: Diagnosis not present

## 2018-10-22 DIAGNOSIS — I214 Non-ST elevation (NSTEMI) myocardial infarction: Secondary | ICD-10-CM | POA: Diagnosis not present

## 2018-10-22 DIAGNOSIS — I25119 Atherosclerotic heart disease of native coronary artery with unspecified angina pectoris: Secondary | ICD-10-CM | POA: Diagnosis not present

## 2018-10-22 DIAGNOSIS — Z955 Presence of coronary angioplasty implant and graft: Secondary | ICD-10-CM | POA: Diagnosis not present

## 2018-10-22 DIAGNOSIS — E785 Hyperlipidemia, unspecified: Secondary | ICD-10-CM | POA: Diagnosis not present

## 2018-10-22 DIAGNOSIS — I10 Essential (primary) hypertension: Secondary | ICD-10-CM | POA: Diagnosis not present

## 2018-10-24 ENCOUNTER — Encounter (HOSPITAL_COMMUNITY)
Admission: RE | Admit: 2018-10-24 | Discharge: 2018-10-24 | Disposition: A | Discharge status: Home / Self Care | Payer: Medicare Other | Source: Ambulatory Visit | Attending: Cardiology | Admitting: Cardiology

## 2018-10-24 DIAGNOSIS — I214 Non-ST elevation (NSTEMI) myocardial infarction: Secondary | ICD-10-CM | POA: Diagnosis not present

## 2018-10-24 DIAGNOSIS — I10 Essential (primary) hypertension: Secondary | ICD-10-CM | POA: Diagnosis not present

## 2018-10-24 DIAGNOSIS — I25119 Atherosclerotic heart disease of native coronary artery with unspecified angina pectoris: Secondary | ICD-10-CM | POA: Diagnosis not present

## 2018-10-24 DIAGNOSIS — Z955 Presence of coronary angioplasty implant and graft: Secondary | ICD-10-CM | POA: Diagnosis not present

## 2018-10-24 DIAGNOSIS — E119 Type 2 diabetes mellitus without complications: Secondary | ICD-10-CM | POA: Diagnosis not present

## 2018-10-24 DIAGNOSIS — E785 Hyperlipidemia, unspecified: Secondary | ICD-10-CM | POA: Diagnosis not present

## 2018-10-27 ENCOUNTER — Encounter (HOSPITAL_COMMUNITY)
Admission: RE | Admit: 2018-10-27 | Discharge: 2018-10-27 | Disposition: A | Discharge status: Home / Self Care | Payer: Medicare Other | Source: Ambulatory Visit | Attending: Cardiology | Admitting: Cardiology

## 2018-10-27 DIAGNOSIS — I25119 Atherosclerotic heart disease of native coronary artery with unspecified angina pectoris: Secondary | ICD-10-CM | POA: Diagnosis not present

## 2018-10-27 DIAGNOSIS — I10 Essential (primary) hypertension: Secondary | ICD-10-CM | POA: Diagnosis not present

## 2018-10-27 DIAGNOSIS — E119 Type 2 diabetes mellitus without complications: Secondary | ICD-10-CM | POA: Diagnosis not present

## 2018-10-27 DIAGNOSIS — E785 Hyperlipidemia, unspecified: Secondary | ICD-10-CM | POA: Diagnosis not present

## 2018-10-27 DIAGNOSIS — Z955 Presence of coronary angioplasty implant and graft: Secondary | ICD-10-CM | POA: Diagnosis not present

## 2018-10-27 DIAGNOSIS — I214 Non-ST elevation (NSTEMI) myocardial infarction: Secondary | ICD-10-CM | POA: Diagnosis not present

## 2018-10-29 ENCOUNTER — Encounter (HOSPITAL_COMMUNITY)
Admission: RE | Admit: 2018-10-29 | Discharge: 2018-10-29 | Disposition: A | Discharge status: Home / Self Care | Payer: Medicare Other | Source: Ambulatory Visit | Attending: Cardiology | Admitting: Cardiology

## 2018-10-29 DIAGNOSIS — I10 Essential (primary) hypertension: Secondary | ICD-10-CM | POA: Diagnosis not present

## 2018-10-29 DIAGNOSIS — Z955 Presence of coronary angioplasty implant and graft: Secondary | ICD-10-CM | POA: Diagnosis not present

## 2018-10-29 DIAGNOSIS — I214 Non-ST elevation (NSTEMI) myocardial infarction: Secondary | ICD-10-CM | POA: Diagnosis not present

## 2018-10-29 DIAGNOSIS — E785 Hyperlipidemia, unspecified: Secondary | ICD-10-CM | POA: Diagnosis not present

## 2018-10-29 DIAGNOSIS — E119 Type 2 diabetes mellitus without complications: Secondary | ICD-10-CM | POA: Diagnosis not present

## 2018-10-29 DIAGNOSIS — I25119 Atherosclerotic heart disease of native coronary artery with unspecified angina pectoris: Secondary | ICD-10-CM | POA: Diagnosis not present
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
## 2018-10-30 NOTE — Progress Notes (Signed)
Cardiac Individual Treatment Plan  Patient Details  Name: Brandon Vasquez MRN: [id number] Date of Birth: 03-06-1946 Referring Provider:     [place] from 08/07/2018 in [place]  Referring Provider  Dr. Ellyn Hack      Initial Encounter Date:    CARDIAC REHAB PHASE II ORIENTATION from 08/07/2018 in [place]  Date  08/07/18      Visit Diagnosis: 05/09/2018 Stented coronary artery S/P DES RPDA Graft  Patient's Home Medications on Admission:  Current Outpatient Medications:  .  amLODipine (NORVASC) 10 MG tablet, Take 1 tablet (10 mg total) by mouth daily., Disp: 90 tablet, Rfl: 4 .  aspirin 81 MG EC tablet, Take 81 mg by mouth daily. Swallow whole., Disp: , Rfl:  .  ezetimibe (ZETIA) 10 MG tablet, Take 1 tablet (10 mg total) by mouth daily., Disp: 30 tablet, Rfl: 6 .  Hypromellose (ARTIFICIAL TEARS OP), Apply 1-2 drops to eye daily as needed (dry eyes)., Disp: , Rfl:  .  isosorbide mononitrate (IMDUR) 60 MG 24 hr tablet, Take 1 tablet (60 mg total) by mouth daily., Disp: , Rfl:  .  losartan (COZAAR) 25 MG tablet, Take 1 tablet (25 mg total) by mouth daily., Disp: , Rfl:  .  metFORMIN (GLUCOPHAGE-XR) 500 MG 24 hr tablet, Take 1 tablet (500 mg total) by mouth daily., Disp: , Rfl:  .  metoprolol succinate (TOPROL-XL) 50 MG 24 hr tablet, Take 1 tablet (50 mg total) by mouth daily. Take with or immediately following a meal., Disp: 30 tablet, Rfl: 6 .  nitroGLYCERIN (NITROSTAT) 0.4 MG SL tablet, Place 1 tablet (0.4 mg total) under the tongue every 5 (five) minutes x 3 doses as needed for chest pain., Disp: 25 tablet, Rfl: 6 .  ranolazine (RANEXA) 500 MG 12 hr tablet, Take 1 tablet (500 mg total) by mouth 2 (two) times daily., Disp: 60 tablet, Rfl: 5 .  rosuvastatin (CRESTOR) 40 MG tablet, Take 1 tablet (40 mg total) by mouth daily., Disp: 90 tablet, Rfl: 3 .  ticagrelor (BRILINTA) 90 MG TABS tablet, Take 1  tablet (90 mg total) by mouth 2 (two) times daily., Disp: 60 tablet, Rfl: 5  Past Medical History: Past Medical History:  Diagnosis Date  . Coronary artery disease involving native heart with angina pectoris [facility]) 2005   a. 2005: CABG x5V in [place]  b. 04/2016: PCI/DES to SVG--> RCA. Severe Ost LCx (with CTO of grafted OMs - no retrograde filling) - distal Cx unprotected; RCA & LAD essentially CTO.;; 05/2018 - SVG-RCA ostial ISR with post-stent 80% - Overlapping DES & high Atm post-dilation of ISR. Progression of Native LM-ostLCx; 1/17/'20 - CSI - DES PCI dLM-ProxCx  2 overlapping DES  . Essential hypertension   . HCV (hepatitis C virus)    "treated" (05/08/2018)  . HLD (hyperlipidemia)   . Hx of CABG 2001   [place] - CABG 4: LIMA-LAD, SeqSVG-OM1-OM2,SVG-rPDA  . MI (myocardial infarction) ([place]) 2001  . Non-ST elevation MI (NSTEMI) ([place]) 04/2016   Culprit lesion was 90% SVG-RPDA -> DES PCI. Also noted 80% ostCx treated medically (subsequent redo PCI of SVG-RCA for ISR-sequential lesion 9/'29, follwed by Staged Atherecotmy-PCI of L-pCx in1/2020)  . Type 2 diabetes mellitus with complication (HCC)    CAD    Tobacco Use: Social History   Tobacco Use  Smoking Status Former Smoker  . Packs/day: 1.00  . Years: 30.00  . Pack years: 30.00  .  Types: Cigarettes  . Last attempt to quit: 07/10/1996  . Years since quitting: 22.3  Smokeless Tobacco Never Used    Labs: Recent Review Flowsheet Data    Labs for ITP Cardiac and Pulmonary Rehab Latest Ref Rng & Units 04/22/2016 12/12/2016 03/28/2017 11/22/2017 04/29/2018   Cholestrol 100 - 199 mg/dL 162 147 - 193 153   LDLCALC 0 - 99 mg/dL 95 73 - 115(H) 78   HDL >39 mg/dL 53 58 - 54 61   Trlycerides 0 - 149 mg/dL 69 82 - 126 70   TCO2 0 - 100 mmol/L - - 24 - -      Capillary Blood Glucose: Lab Results  Component Value Date   GLUCAP 108 (H) 09/26/2018   GLUCAP 90 09/26/2018   GLUCAP 132 (H) 09/19/2018   GLUCAP 88 09/18/2018   GLUCAP 109 (H)  09/18/2018     Exercise Target Goals: Exercise Program Goal: Individual exercise prescription set using results from initial 6 min walk test and THRR while considering  patient's activity barriers and safety.   Exercise Prescription Goal: Initial exercise prescription builds to 30-45 minutes a day of aerobic activity, 2-3 days per week.  Home exercise guidelines will be given to patient during program as part of exercise prescription that the participant will acknowledge.  Activity Barriers & Risk Stratification: Activity Barriers & Cardiac Risk Stratification - 08/07/18 1119      Activity Barriers & Cardiac Risk Stratification   Activity Barriers  None    Cardiac Risk Stratification  Moderate       6 Minute Walk: 6 Minute Walk    Row Name 08/07/18 1119         6 Minute Walk   Phase  Initial     Distance  1200 feet     Walk Time  6 minutes     # of Rest Breaks  0     MPH  2.27     METS  2.54     RPE  9     VO2 Peak  8.89     Symptoms  No     Resting HR  66 bpm     Resting BP  118/78     Resting Oxygen Saturation   99 %     Exercise Oxygen Saturation  during 6 min walk  98 %     Max Ex. HR  81 bpm     Max Ex. BP  124/82     2 Minute Post BP  130/74        Oxygen Initial Assessment:   Oxygen Re-Evaluation:   Oxygen Discharge (Final Oxygen Re-Evaluation):   Initial Exercise Prescription: Initial Exercise Prescription - 08/07/18 1100      Date of Initial Exercise RX and Referring Provider   Date  08/07/18    Referring Provider  Dr. Ellyn Hack    Expected Discharge Date  11/12/18      Recumbant Bike   Level  0.7    Minutes  10    METs  2.47      NuStep   Level  2    SPM  75    Minutes  10    METs  2.3      Track   Laps  9    Minutes  10    METs  2.53      Prescription Details   Frequency (times per week)  3    Duration  Progress to 30 minutes of  continuous aerobic without signs/symptoms of physical distress      Intensity   THRR 40-80% of  Max Heartrate  60-119    Ratings of Perceived Exertion  11-13      Progression   Progression  Continue to progress workloads to maintain intensity without signs/symptoms of physical distress.      Resistance Training   Training Prescription  Yes    Weight  3 lbs.     Reps  10-15       Perform Capillary Blood Glucose checks as needed.  Exercise Prescription Changes:  Exercise Prescription Changes    Row Name 08/13/18 1515 08/20/18 1511 09/01/18 1451 09/29/18 1448 10/13/18 1450     Response to Exercise   Blood Pressure (Admit)  122/70  108/72  122/68  122/70  116/76   Blood Pressure (Exercise)  132/80  140/72  138/80  140/70  122/80   Blood Pressure (Exit)  118/70  108/74  118/76  110/80  112/70   Heart Rate (Admit)  66 bpm  83 bpm  95 bpm  80 bpm  73 bpm   Heart Rate (Exercise)  99 bpm  96 bpm  103 bpm  108 bpm  109 bpm   Heart Rate (Exit)  71 bpm  83 bpm  74 bpm  72 bpm  80 bpm   Rating of Perceived Exertion (Exercise)  '13  11  13  13  14   '$ Symptoms  none  none  none  none  none   Duration  Progress to 30 minutes of  aerobic without signs/symptoms of physical distress  Progress to 30 minutes of  aerobic without signs/symptoms of physical distress  Progress to 30 minutes of  aerobic without signs/symptoms of physical distress  Progress to 30 minutes of  aerobic without signs/symptoms of physical distress  Progress to 30 minutes of  aerobic without signs/symptoms of physical distress   Intensity  THRR unchanged  THRR unchanged  THRR unchanged  THRR unchanged  THRR unchanged     Progression   Progression  Continue to progress workloads to maintain intensity without signs/symptoms of physical distress.  Continue to progress workloads to maintain intensity without signs/symptoms of physical distress.  Continue to progress workloads to maintain intensity without signs/symptoms of physical distress.  Continue to progress workloads to maintain intensity without signs/symptoms of physical  distress.  Continue to progress workloads to maintain intensity without signs/symptoms of physical distress.   Average METs  2.2  2.7  2.8  2.7  3.2     Resistance Training   Training Prescription  No Relaxation day, no weights.  No Relaxation day, no weights.  Yes  Yes  Yes   Weight  -  -  5lbs  5lbs  5lbs   Reps  -  -  10-15  10-15  10-15   Time  -  -  10 Minutes  10 Minutes  10 Minutes     Interval Training   Interval Training  No  No  No  No  No     Bike   Level  0.7  0.9  0.9  0.9  1.2   Minutes  '10  10  10  10  10   '$ METs  2.47  2.9  2.91  2.93  3.55     Recumbant Bike   Level  -  -  -  -  -   Minutes  -  -  -  -  -  METs  -  -  -  -  -     NuStep   Level  '2  2  4  3  4   '$ SPM  75  75  75  85  85   Minutes  '10  10  10  10  10   '$ METs  2.2  2.5  2.7  2.7  3.2     Track   Laps  '6  9  3  6  11   '$ Minutes  10  10  -  7  7   METs  2  2.57  -  2.49  2.92     Home Exercise Plan   Plans to continue exercise at  -  Home (comment) Walking  Home (comment) Walking  Home (comment) Walking  Home (comment) Walking   Frequency  -  Add 1 additional day to program exercise sessions.  Add 1 additional day to program exercise sessions.  Add 1 additional day to program exercise sessions.  Add 1 additional day to program exercise sessions.   Initial Home Exercises Provided  -  08/20/18  08/20/18  08/20/18  08/20/18   Row Name 10/27/18 1450             Response to Exercise   Blood Pressure (Admit)  122/70       Blood Pressure (Exercise)  138/62       Blood Pressure (Exit)  110/72       Heart Rate (Admit)  78 bpm       Heart Rate (Exercise)  112 bpm       Heart Rate (Exit)  70 bpm       Rating of Perceived Exertion (Exercise)  13       Symptoms  none       Duration  Progress to 30 minutes of  aerobic without signs/symptoms of physical distress       Intensity  THRR unchanged         Progression   Progression  Continue to progress workloads to maintain intensity without  signs/symptoms of physical distress.       Average METs  3.2         Resistance Training   Training Prescription  Yes       Weight  5lbs       Reps  10-15       Time  10 Minutes         Interval Training   Interval Training  No         Bike   Level  1.2       Minutes  10       METs  3.56         NuStep   Level  5       SPM  85       Minutes  10       METs  2.8         Track   Laps  12       Minutes  7       METs  3.09         Home Exercise Plan   Plans to continue exercise at  Home (comment) Walking       Frequency  Add 1 additional day to program exercise sessions.       Initial Home Exercises Provided  08/20/18          Exercise  Comments:  Exercise Comments    Row Name 08/13/18 1630 08/20/18 1534 09/01/18 1545 09/29/18 1509 10/13/18 1450   Exercise Comments  Patient tolerated low intensity exercise without c/o. Will progress workloads as tolerated.  Reviewed home exercise guidelines, METs, and goals with patient.  Reviewed METs with patient.  Reviewed METs and goals with patient. Patient states that he is tolerating exercise better since having stents placed on 09/18/2018.  Reviewed METs and goals with patient.   [place] Name 10/27/18 1502           Exercise Comments  METs reviewed with patient.          Exercise Goals and Review:  Exercise Goals    Row Name 08/07/18 1120             Exercise Goals   Increase Physical Activity  Yes       Intervention  Provide advice, education, support and counseling about physical activity/exercise needs.;Develop an individualized exercise prescription for aerobic and resistive training based on initial evaluation findings, risk stratification, comorbidities and participant's personal goals.       Expected Outcomes  Short Term: Attend rehab on a regular basis to increase amount of physical activity.       Increase Strength and Stamina  Yes       Intervention  Provide advice, education, support and counseling about physical  activity/exercise needs.;Develop an individualized exercise prescription for aerobic and resistive training based on initial evaluation findings, risk stratification, comorbidities and participant's personal goals.       Expected Outcomes  Short Term: Increase workloads from initial exercise prescription for resistance, speed, and METs.       Able to understand and use rate of perceived exertion (RPE) scale  Yes       Intervention  Provide education and explanation on how to use RPE scale       Expected Outcomes  Short Term: Able to use RPE daily in rehab to express subjective intensity level;Long Term:  Able to use RPE to guide intensity level when exercising independently       Knowledge and understanding of Target Heart Rate Range (THRR)  Yes       Intervention  Provide education and explanation of THRR including how the numbers were predicted and where they are located for reference       Expected Outcomes  Short Term: Able to state/look up THRR;Long Term: Able to use THRR to govern intensity when exercising independently;Short Term: Able to use daily as guideline for intensity in rehab       Able to check pulse independently  Yes       Intervention  Provide education and demonstration on how to check pulse in carotid and radial arteries.;Review the importance of being able to check your own pulse for safety during independent exercise       Expected Outcomes  Short Term: Able to explain why pulse checking is important during independent exercise;Long Term: Able to check pulse independently and accurately       Understanding of Exercise Prescription  Yes       Intervention  Provide education, explanation, and written materials on patient's individual exercise prescription       Expected Outcomes  Short Term: Able to explain program exercise prescription;Long Term: Able to explain home exercise prescription to exercise independently          Exercise Goals Re-Evaluation : Exercise Goals  Re-Evaluation    Row Name 08/13/18 1603 08/20/18  1534 09/29/18 1509 10/13/18 1450       Exercise Goal Re-Evaluation   Exercise Goals Review  Increase Physical Activity;Able to understand and use rate of perceived exertion (RPE) scale  Increase Physical Activity;Able to understand and use rate of perceived exertion (RPE) scale;Knowledge and understanding of Target Heart Rate Range (THRR);Understanding of Exercise Prescription;Increase Strength and Stamina  Increase Physical Activity;Able to understand and use rate of perceived exertion (RPE) scale;Knowledge and understanding of Target Heart Rate Range (THRR);Understanding of Exercise Prescription;Increase Strength and Stamina  Increase Physical Activity;Able to understand and use rate of perceived exertion (RPE) scale;Knowledge and understanding of Target Heart Rate Range (THRR);Understanding of Exercise Prescription;Increase Strength and Stamina    Comments  Patient able to understand and use RPE scale appropriately.   Reviewed home exercise guidelines with patient including THRR, RPE scale, and endpoints for exercise. Pt has treadmill, bike, and weights at home, but is not currenlty exercising. Pt states he needs the motivation to exercise.  Patient states that he is tolerating exercise better since having stents placed on 09/18/2018. Pt states he's able to exercise 10 minutes on NuStep without taking rest breaks like he previously had to. Pt still needing to rest on stationary bike.   Patient is making very good progress with exercise. Pt is no longer experiencing burning in chest with exercise since recent stent placement. Pt doesn't feel stamina has improved yet, but MET level is gradually improving.    Expected Outcomes  Increase workloads as tolerated to help achieve personal health and fitness goals.  Pt will walk at least one day at home in addition to exercise at cardiac rehab. Increase workloads at CR to help build confidence and motivation for  exercise.  Increase workloads as tolerated to help build strength and stamina.  Continue to progress workloads to improve cardiorespiratory fitness.       Discharge Exercise Prescription (Final Exercise Prescription Changes): Exercise Prescription Changes - 10/27/18 1450      Response to Exercise   Blood Pressure (Admit)  122/70    Blood Pressure (Exercise)  138/62    Blood Pressure (Exit)  110/72    Heart Rate (Admit)  78 bpm    Heart Rate (Exercise)  112 bpm    Heart Rate (Exit)  70 bpm    Rating of Perceived Exertion (Exercise)  13    Symptoms  none    Duration  Progress to 30 minutes of  aerobic without signs/symptoms of physical distress    Intensity  THRR unchanged      Progression   Progression  Continue to progress workloads to maintain intensity without signs/symptoms of physical distress.    Average METs  3.2      Resistance Training   Training Prescription  Yes    Weight  5lbs    Reps  10-15    Time  10 Minutes      Interval Training   Interval Training  No      Bike   Level  1.2    Minutes  10    METs  3.56      NuStep   Level  5    SPM  85    Minutes  10    METs  2.8      Track   Laps  12    Minutes  7    METs  3.09      Home Exercise Plan   Plans to continue exercise at  Home (comment)  Walking   Frequency  Add 1 additional day to program exercise sessions.    Initial Home Exercises Provided  08/20/18       Nutrition:  Target Goals: Understanding of nutrition guidelines, daily intake of sodium '1500mg'$ , cholesterol '200mg'$ , calories 30% from fat and 7% or less from saturated fats, daily to have 5 or more servings of fruits and vegetables.  Biometrics: Pre Biometrics - 08/07/18 1120      Pre Biometrics   Height  5' 11.5" (1.816 m)    Weight  197 lb 8.5 oz (89.6 kg)    Waist Circumference  38.5 inches    Hip Circumference  41.4 inches    Waist to Hip Ratio  0.93 %    BMI (Calculated)  27.17    Triceps Skinfold  20 mm    % Body Fat  27.6  %    Grip Strength  35 kg    Flexibility  7 in    Single Leg Stand  5.31 seconds        Nutrition Therapy Plan and Nutrition Goals: Nutrition Therapy & Goals - 08/07/18 1103      Nutrition Therapy   Diet  heart healthy, diabetic      Personal Nutrition Goals   Nutrition Goal  Pt to identify and limit food sources of saturated fat, trans fat, refined carbohydrates and sodium    Personal Goal #2  Pt to describe the benefit of including fruits, vegetables, whole grains, and low-fat dairy products in a heart healthy meal plan.    Personal Goal #3  Pt able to name foods that affect blood glucose.      Intervention Plan   Intervention  Prescribe, educate and counsel regarding individualized specific dietary modifications aiming towards targeted core components such as weight, hypertension, lipid management, diabetes, heart failure and other comorbidities.    Expected Outcomes  Short Term Goal: Understand basic principles of dietary content, such as calories, fat, sodium, cholesterol and nutrients.;Long Term Goal: Adherence to prescribed nutrition plan.       Nutrition Assessments: Nutrition Assessments - 08/07/18 1104      MEDFICTS Scores   Pre Score  30       Nutrition Goals Re-Evaluation:   Nutrition Goals Re-Evaluation:   Nutrition Goals Discharge (Final Nutrition Goals Re-Evaluation):   Psychosocial: Target Goals: Acknowledge presence or absence of significant depression and/or stress, maximize coping skills, provide positive support system. Participant is able to verbalize types and ability to use techniques and skills needed for reducing stress and depression.  Initial Review & Psychosocial Screening: Initial Psych Review & Screening - 08/07/18 1216      Initial Review   Current issues with  None Identified      Family Dynamics   Good Support System?  Yes   [name] has his wife for support   Comments  [name] said he is having some marital problems      Barriers    Psychosocial barriers to participate in program  The patient should benefit from training in stress management and relaxation.      Screening Interventions   Interventions  Encouraged to exercise;To provide support and resources with identified psychosocial needs    Expected Outcomes  Long Term Goal: Stressors or current issues are controlled or eliminated.       Quality of Life Scores: Quality of Life - 08/07/18 1109      Quality of Life   Select  Quality of Life  Quality of Life Scores   Health/Function Pre  22.6 %    Socioeconomic Pre  21.86 %    Psych/Spiritual Pre  24.21 %    Family Pre  20.4 %    GLOBAL Pre  22.46 %      Scores of 19 and below usually indicate a poorer quality of life in these areas.  A difference of  2-3 points is a clinically meaningful difference.  A difference of 2-3 points in the total score of the Quality of Life Index has been associated with significant improvement in overall quality of life, self-image, physical symptoms, and general health in studies assessing change in quality of life.  PHQ-9: Recent Review Flowsheet Data    Depression screen [facility] 2/9 08/13/2018   Decreased Interest 0   Down, Depressed, Hopeless 0   PHQ - 2 Score 0     Interpretation of Total Score  Total Score Depression Severity:  1-4 = Minimal depression, 5-9 = Mild depression, 10-14 = Moderate depression, 15-19 = Moderately severe depression, 20-27 = Severe depression   Psychosocial Evaluation and Intervention:   Psychosocial Re-Evaluation: Psychosocial Re-Evaluation    Row Name 09/11/18 1357 10/09/18 1521 10/30/18 1744         Psychosocial Re-Evaluation   Current issues with  Current Stress Concerns  Current Stress Concerns  Current Stress Concerns     Comments  [name] has not voiced any further increased stressors will continue to monitor the patient.  [name] has not voiced any further increased stressors will continue to monitor the patient.  [name] has not  voiced any further increased stressors will continue to monitor the patient.     Interventions  Stress management education  Stress management education  Stress management education     Continue Psychosocial Services   No Follow up required  No Follow up required  No Follow up required       Initial Review   Source of Stress Concerns  Chronic Illness  Chronic Illness  Chronic Illness        Psychosocial Discharge (Final Psychosocial Re-Evaluation): Psychosocial Re-Evaluation - 10/30/18 1744      Psychosocial Re-Evaluation   Current issues with  Current Stress Concerns    Comments  [name] has not voiced any further increased stressors will continue to monitor the patient.    Interventions  Stress management education    Continue Psychosocial Services   No Follow up required      Initial Review   Source of Stress Concerns  Chronic Illness       Vocational Rehabilitation: Provide vocational rehab assistance to qualifying candidates.   Vocational Rehab Evaluation & Intervention: Vocational Rehab - 08/07/18 1218      Initial Vocational Rehab Evaluation & Intervention   Assessment shows need for Vocational Rehabilitation  No   [name] is retired and does not need voational rehab at this time      Education: Education Goals: Education classes will be provided on a weekly basis, covering required topics. Participant will state understanding/return demonstration of topics presented.  Learning Barriers/Preferences: Learning Barriers/Preferences - 08/07/18 1122      Learning Barriers/Preferences   Learning Barriers  Sight    Learning Preferences  Individual Instruction;Video;Skilled Demonstration;Pictoral;Group Instruction       Education Topics: Count Your Pulse:  -Group instruction provided by verbal instruction, demonstration, patient participation and written materials to support subject.  Instructors address importance of being able to find your pulse and how to count your  pulse when at home without a heart monitor.  Patients get hands on experience counting their pulse with staff help and individually.   Heart Attack, Angina, and Risk Factor Modification:  -Group instruction provided by verbal instruction, video, and written materials to support subject.  Instructors address signs and symptoms of angina and heart attacks.    Also discuss risk factors for heart disease and how to make changes to improve heart health risk factors.   CARDIAC REHAB PHASE II EXERCISE from 10/29/2018 in [place]  Date  08/13/18  Instruction Review Code  2- Demonstrated Understanding      Functional Fitness:  -Group instruction provided by verbal instruction, demonstration, patient participation, and written materials to support subject.  Instructors address safety measures for doing things around the house.  Discuss how to get up and down off the floor, how to pick things up properly, how to safely get out of a chair without assistance, and balance training.   CARDIAC REHAB PHASE II EXERCISE from 10/29/2018 in [place]  Date  10/24/18  Educator  EP  Instruction Review Code  2- Demonstrated Understanding      Meditation and Mindfulness:  -Group instruction provided by verbal instruction, patient participation, and written materials to support subject.  Instructor addresses importance of mindfulness and meditation practice to help reduce stress and improve awareness.  Instructor also leads participants through a meditation exercise.    Stretching for Flexibility and Mobility:  -Group instruction provided by verbal instruction, patient participation, and written materials to support subject.  Instructors lead participants through series of stretches that are designed to increase flexibility thus improving mobility.  These stretches are additional exercise for major muscle groups that are typically performed during  regular warm up and cool down.   Hands Only CPR:  -Group verbal, video, and participation provides a basic overview of AHA guidelines for community CPR. Role-play of emergencies allow participants the opportunity to practice calling for help and chest compression technique with discussion of AED use.   Hypertension: -Group verbal and written instruction that provides a basic overview of hypertension including the most recent diagnostic guidelines, risk factor reduction with self-care instructions and medication management.    Nutrition I class: Heart Healthy Eating:  -Group instruction provided by PowerPoint slides, verbal discussion, and written materials to support subject matter. The instructor gives an explanation and review of the Therapeutic Lifestyle Changes diet recommendations, which includes a discussion on lipid goals, dietary fat, sodium, fiber, plant stanol/sterol esters, sugar, and the components of a well-balanced, healthy diet.   Nutrition II class: Lifestyle Skills:  -Group instruction provided by PowerPoint slides, verbal discussion, and written materials to support subject matter. The instructor gives an explanation and review of label reading, grocery shopping for heart health, heart healthy recipe modifications, and ways to make healthier choices when eating out.   Diabetes Question & Answer:  -Group instruction provided by PowerPoint slides, verbal discussion, and written materials to support subject matter. The instructor gives an explanation and review of diabetes co-morbidities, pre- and post-prandial blood glucose goals, pre-exercise blood glucose goals, signs, symptoms, and treatment of hypoglycemia and hyperglycemia, and foot care basics.   Diabetes Blitz:  -Group instruction provided by PowerPoint slides, verbal discussion, and written materials to support subject matter. The instructor gives an explanation and review of the physiology behind type 1 and type 2  diabetes, diabetes medications and rational behind using different medications, pre- and post-prandial blood  glucose recommendations and Hemoglobin A1c goals, diabetes diet, and exercise including blood glucose guidelines for exercising safely.    Portion Distortion:  -Group instruction provided by PowerPoint slides, verbal discussion, written materials, and food models to support subject matter. The instructor gives an explanation of serving size versus portion size, changes in portions sizes over the last 20 years, and what consists of a serving from each food group.   Stress Management:  -Group instruction provided by verbal instruction, video, and written materials to support subject matter.  Instructors review role of stress in heart disease and how to cope with stress positively.     Exercising on Your Own:  -Group instruction provided by verbal instruction, power point, and written materials to support subject.  Instructors discuss benefits of exercise, components of exercise, frequency and intensity of exercise, and end points for exercise.  Also discuss use of nitroglycerin and activating EMS.  Review options of places to exercise outside of rehab.  Review guidelines for sex with heart disease.   CARDIAC REHAB PHASE II EXERCISE from 10/29/2018 in [place]  Date  10/29/18  Educator  EP  Instruction Review Code  2- Demonstrated Understanding      Cardiac Drugs I:  -Group instruction provided by verbal instruction and written materials to support subject.  Instructor reviews cardiac drug classes: antiplatelets, anticoagulants, beta blockers, and statins.  Instructor discusses reasons, side effects, and lifestyle considerations for each drug class.   CARDIAC REHAB PHASE II EXERCISE from 10/29/2018 in [place]  Date  09/10/18  Educator  Pharmacist  Instruction Review Code  2- Demonstrated Understanding      Cardiac  Drugs II:  -Group instruction provided by verbal instruction and written materials to support subject.  Instructor reviews cardiac drug classes: angiotensin converting enzyme inhibitors (ACE-I), angiotensin II receptor blockers (ARBs), nitrates, and calcium channel blockers.  Instructor discusses reasons, side effects, and lifestyle considerations for each drug class.   Anatomy and Physiology of the Circulatory System:  Group verbal and written instruction and models provide basic cardiac anatomy and physiology, with the coronary electrical and arterial systems. Review of: AMI, Angina, Valve disease, Heart Failure, Peripheral Artery Disease, Cardiac Arrhythmia, Pacemakers, and the ICD.   CARDIAC REHAB PHASE II EXERCISE from 10/29/2018 in [place]  Date  10/08/18  Instruction Review Code  2- Demonstrated Understanding      Other Education:  -Group or individual verbal, written, or video instructions that support the educational goals of the cardiac rehab program.   Holiday Eating Survival Tips:  -Group instruction provided by PowerPoint slides, verbal discussion, and written materials to support subject matter. The instructor gives patients tips, tricks, and techniques to help them not only survive but enjoy the holidays despite the onslaught of food that accompanies the holidays.   Knowledge Questionnaire Score: Knowledge Questionnaire Score - 08/07/18 1106      Knowledge Questionnaire Score   Pre Score  20/24       Core Components/Risk Factors/Patient Goals at Admission: Personal Goals and Risk Factors at Admission - 08/07/18 1218      Core Components/Risk Factors/Patient Goals on Admission    Weight Management  Yes;Weight Maintenance;Weight Loss    Intervention  Weight Management: Develop a combined nutrition and exercise program designed to reach desired caloric intake, while maintaining appropriate intake of nutrient and fiber, sodium and fats,  and appropriate energy expenditure required for the weight goal.;Weight Management: Provide  education and appropriate resources to help participant work on and attain dietary goals.;Weight Management/Obesity: Establish reasonable short term and long term weight goals.    Admit Weight  197 lb 8.5 oz (89.6 kg)    Expected Outcomes  Short Term: Continue to assess and modify interventions until short term weight is achieved;Long Term: Adherence to nutrition and physical activity/exercise program aimed toward attainment of established weight goal;Weight Maintenance: Understanding of the daily nutrition guidelines, which includes 25-35% calories from fat, 7% or less cal from saturated fats, less than '200mg'$  cholesterol, less than 1.5gm of sodium, & 5 or more servings of fruits and vegetables daily;Weight Loss: Understanding of general recommendations for a balanced deficit meal plan, which promotes 1-2 lb weight loss per week and includes a negative energy balance of [phone] kcal/d;Understanding recommendations for meals to include 15-35% energy as protein, 25-35% energy from fat, 35-60% energy from carbohydrates, less than '200mg'$  of dietary cholesterol, 20-35 gm of total fiber daily;Understanding of distribution of calorie intake throughout the day with the consumption of 4-5 meals/snacks    Diabetes  Yes    Intervention  Provide education about signs/symptoms and action to take for hypo/hyperglycemia.;Provide education about proper nutrition, including hydration, and aerobic/resistive exercise prescription along with prescribed medications to achieve blood glucose in normal ranges: Fasting glucose 65-99 mg/dL    Expected Outcomes  Short Term: Participant verbalizes understanding of the signs/symptoms and immediate care of hyper/hypoglycemia, proper foot care and importance of medication, aerobic/resistive exercise and nutrition plan for blood glucose control.;Long Term: Attainment of HbA1C < 7%.    Hypertension   Yes    Intervention  Provide education on lifestyle modifcations including regular physical activity/exercise, weight management, moderate sodium restriction and increased consumption of fresh fruit, vegetables, and low fat dairy, alcohol moderation, and smoking cessation.;Monitor prescription use compliance.    Expected Outcomes  Short Term: Continued assessment and intervention until BP is < 140/67m HG in hypertensive participants. < 130/845mHG in hypertensive participants with diabetes, heart failure or chronic kidney disease.;Long Term: Maintenance of blood pressure at goal levels.    Lipids  Yes    Intervention  Provide education and support for participant on nutrition & aerobic/resistive exercise along with prescribed medications to achieve LDL '70mg'$ , HDL >'40mg'$ .    Expected Outcomes  Short Term: Participant states understanding of desired cholesterol values and is compliant with medications prescribed. Participant is following exercise prescription and nutrition guidelines.;Long Term: Cholesterol controlled with medications as prescribed, with individualized exercise RX and with personalized nutrition plan. Value goals: LDL < '70mg'$ , HDL > 40 mg.    Stress  Yes    Intervention  Offer individual and/or small group education and counseling on adjustment to heart disease, stress management and health-related lifestyle change. Teach and support self-help strategies.;Refer participants experiencing significant psychosocial distress to appropriate mental health specialists for further evaluation and treatment. When possible, include family members and significant others in education/counseling sessions.    Expected Outcomes  Short Term: Participant demonstrates changes in health-related behavior, relaxation and other stress management skills, ability to obtain effective social support, and compliance with psychotropic medications if prescribed.;Long Term: Emotional wellbeing is indicated by absence of  clinically significant psychosocial distress or social isolation.       Core Components/Risk Factors/Patient Goals Review:  Goals and Risk Factor Review    Row Name 08/13/18 1633 09/11/18 1359 10/09/18 1521 10/30/18 1744       Core Components/Risk Factors/Patient Goals Review   Personal Goals Review  Weight Management/Obesity;Hypertension;Diabetes  Weight Management/Obesity;Hypertension;Diabetes  Weight Management/Obesity;Hypertension;Diabetes  Weight Management/Obesity;Hypertension;Diabetes    Review  [name] started exercise at cardiac rehab today  [name] has been doing fair with exercise [name]'s vital signs have been stable. [name] has not been able to increase his work loads due to his CAD  [name] has been doing better with exercise [name]'s vital signs have been stable. [name] has been doing well since his recent stenting.  [name] has been doing better with exercise [name]'s vital signs have been stable. [name] has been doing well since his recent stenting.    Expected Outcomes  [name] will continue to participate in phase 2 cardiac rehab for exercise nutrtion and lifestyle modfications  [name] will continue to participate in phase 2 cardiac rehab for exercise nutrtion and lifestyle modfications  [name] will continue to participate in phase 2 cardiac rehab for exercise nutrtion and lifestyle modfications  [name] will continue to participate in phase 2 cardiac rehab for exercise nutrtion and lifestyle modfications       Core Components/Risk Factors/Patient Goals at Discharge (Final Review):  Goals and Risk Factor Review - 10/30/18 1744      Core Components/Risk Factors/Patient Goals Review   Personal Goals Review  Weight Management/Obesity;Hypertension;Diabetes    Review  [name] has been doing better with exercise [name]'s vital signs have been stable. [name] has been doing well since his recent stenting.    Expected Outcomes  [name] will continue to participate in phase 2 cardiac rehab for exercise nutrtion  and lifestyle modfications       ITP Comments: ITP Comments    Row Name 08/06/18 1608 08/13/18 1631 09/11/18 1356 10/09/18 1521 10/30/18 1744   ITP Comments  Dr.Traci Radford Pax, Medical Director   30 Day ITP Review. [name] started cardiac rehab today  30 Day ITP Review. [name] is good attendance and participation in phase 2 cardiac rehab.  30 Day ITP Review. [name] is good attendance and participation in phase 2 cardiac rehab.  30 Day ITP Review. [name] is good attendance and participation in phase 2 cardiac rehab.      Comments: See ITP comments.Barnet Pall, RN,BSN 11/06/2018 10:27 AM

## 2018-10-31 ENCOUNTER — Encounter (HOSPITAL_COMMUNITY)
Admission: RE | Admit: 2018-10-31 | Discharge: 2018-10-31 | Disposition: A | Discharge status: Home / Self Care | Payer: Medicare Other | Source: Ambulatory Visit | Attending: Cardiology | Admitting: Cardiology

## 2018-10-31 DIAGNOSIS — E785 Hyperlipidemia, unspecified: Secondary | ICD-10-CM | POA: Diagnosis not present

## 2018-10-31 DIAGNOSIS — Z955 Presence of coronary angioplasty implant and graft: Secondary | ICD-10-CM

## 2018-10-31 DIAGNOSIS — I214 Non-ST elevation (NSTEMI) myocardial infarction: Secondary | ICD-10-CM | POA: Diagnosis not present

## 2018-10-31 DIAGNOSIS — E119 Type 2 diabetes mellitus without complications: Secondary | ICD-10-CM | POA: Diagnosis not present

## 2018-10-31 DIAGNOSIS — I10 Essential (primary) hypertension: Secondary | ICD-10-CM | POA: Diagnosis not present

## 2018-10-31 DIAGNOSIS — I25119 Atherosclerotic heart disease of native coronary artery with unspecified angina pectoris: Secondary | ICD-10-CM | POA: Diagnosis not present

## 2018-11-03 ENCOUNTER — Encounter (HOSPITAL_COMMUNITY)
Admission: RE | Admit: 2018-11-03 | Discharge: 2018-11-03 | Disposition: A | Discharge status: Home / Self Care | Payer: Medicare Other | Source: Ambulatory Visit | Attending: Cardiology | Admitting: Cardiology

## 2018-11-03 DIAGNOSIS — Z955 Presence of coronary angioplasty implant and graft: Secondary | ICD-10-CM | POA: Insufficient documentation

## 2018-11-03 DIAGNOSIS — I10 Essential (primary) hypertension: Secondary | ICD-10-CM | POA: Insufficient documentation

## 2018-11-03 DIAGNOSIS — I214 Non-ST elevation (NSTEMI) myocardial infarction: Secondary | ICD-10-CM | POA: Diagnosis not present

## 2018-11-03 DIAGNOSIS — I25119 Atherosclerotic heart disease of native coronary artery with unspecified angina pectoris: Secondary | ICD-10-CM | POA: Insufficient documentation

## 2018-11-03 DIAGNOSIS — Z87891 Personal history of nicotine dependence: Secondary | ICD-10-CM | POA: Insufficient documentation

## 2018-11-03 DIAGNOSIS — E119 Type 2 diabetes mellitus without complications: Secondary | ICD-10-CM | POA: Insufficient documentation

## 2018-11-03 DIAGNOSIS — E785 Hyperlipidemia, unspecified: Secondary | ICD-10-CM | POA: Insufficient documentation

## 2018-11-05 ENCOUNTER — Encounter (HOSPITAL_COMMUNITY)
Admission: RE | Admit: 2018-11-05 | Discharge: 2018-11-05 | Disposition: A | Discharge status: Home / Self Care | Payer: Medicare Other | Source: Ambulatory Visit | Attending: Cardiology | Admitting: Cardiology

## 2018-11-05 DIAGNOSIS — Z955 Presence of coronary angioplasty implant and graft: Secondary | ICD-10-CM

## 2018-11-05 DIAGNOSIS — I10 Essential (primary) hypertension: Secondary | ICD-10-CM | POA: Diagnosis not present

## 2018-11-05 DIAGNOSIS — I25119 Atherosclerotic heart disease of native coronary artery with unspecified angina pectoris: Secondary | ICD-10-CM | POA: Diagnosis not present

## 2018-11-05 DIAGNOSIS — E785 Hyperlipidemia, unspecified: Secondary | ICD-10-CM | POA: Diagnosis not present

## 2018-11-05 DIAGNOSIS — I214 Non-ST elevation (NSTEMI) myocardial infarction: Secondary | ICD-10-CM | POA: Diagnosis not present

## 2018-11-05 DIAGNOSIS — E119 Type 2 diabetes mellitus without complications: Secondary | ICD-10-CM | POA: Diagnosis not present

## 2018-11-07 ENCOUNTER — Encounter (HOSPITAL_COMMUNITY)
Admission: RE | Admit: 2018-11-07 | Discharge: 2018-11-07 | Disposition: A | Discharge status: Home / Self Care | Payer: Medicare Other | Source: Ambulatory Visit | Attending: Cardiology | Admitting: Cardiology

## 2018-11-07 DIAGNOSIS — I25119 Atherosclerotic heart disease of native coronary artery with unspecified angina pectoris: Secondary | ICD-10-CM | POA: Diagnosis not present

## 2018-11-07 DIAGNOSIS — I10 Essential (primary) hypertension: Secondary | ICD-10-CM | POA: Diagnosis not present

## 2018-11-07 DIAGNOSIS — I214 Non-ST elevation (NSTEMI) myocardial infarction: Secondary | ICD-10-CM | POA: Diagnosis not present

## 2018-11-07 DIAGNOSIS — E785 Hyperlipidemia, unspecified: Secondary | ICD-10-CM | POA: Diagnosis not present

## 2018-11-07 DIAGNOSIS — E119 Type 2 diabetes mellitus without complications: Secondary | ICD-10-CM | POA: Diagnosis not present

## 2018-11-07 DIAGNOSIS — Z955 Presence of coronary angioplasty implant and graft: Secondary | ICD-10-CM

## 2018-11-10 ENCOUNTER — Encounter (HOSPITAL_COMMUNITY)
Admission: RE | Admit: 2018-11-10 | Discharge: 2018-11-10 | Disposition: A | Discharge status: Home / Self Care | Payer: Medicare Other | Source: Ambulatory Visit | Attending: Cardiology | Admitting: Cardiology

## 2018-11-10 DIAGNOSIS — Z955 Presence of coronary angioplasty implant and graft: Secondary | ICD-10-CM

## 2018-11-10 DIAGNOSIS — I10 Essential (primary) hypertension: Secondary | ICD-10-CM | POA: Diagnosis not present

## 2018-11-10 DIAGNOSIS — I214 Non-ST elevation (NSTEMI) myocardial infarction: Secondary | ICD-10-CM | POA: Diagnosis not present

## 2018-11-10 DIAGNOSIS — I25119 Atherosclerotic heart disease of native coronary artery with unspecified angina pectoris: Secondary | ICD-10-CM | POA: Diagnosis not present

## 2018-11-10 DIAGNOSIS — E785 Hyperlipidemia, unspecified: Secondary | ICD-10-CM | POA: Diagnosis not present

## 2018-11-10 DIAGNOSIS — E119 Type 2 diabetes mellitus without complications: Secondary | ICD-10-CM | POA: Diagnosis not present

## 2018-11-10 LAB — GLUCOSE, CAPILLARY: Glucose-Capillary: 113 mg/dL — ABNORMAL HIGH (ref 70–99)

## 2018-11-12 ENCOUNTER — Encounter (HOSPITAL_COMMUNITY)
Admission: RE | Admit: 2018-11-12 | Discharge: 2018-11-12 | Disposition: A | Discharge status: Home / Self Care | Payer: Medicare Other | Source: Ambulatory Visit | Attending: Cardiology | Admitting: Cardiology

## 2018-11-12 ENCOUNTER — Other Ambulatory Visit: Payer: Self-pay

## 2018-11-12 DIAGNOSIS — E119 Type 2 diabetes mellitus without complications: Secondary | ICD-10-CM | POA: Diagnosis not present

## 2018-11-12 DIAGNOSIS — I214 Non-ST elevation (NSTEMI) myocardial infarction: Secondary | ICD-10-CM | POA: Diagnosis not present

## 2018-11-12 DIAGNOSIS — Z955 Presence of coronary angioplasty implant and graft: Secondary | ICD-10-CM

## 2018-11-12 DIAGNOSIS — I25119 Atherosclerotic heart disease of native coronary artery with unspecified angina pectoris: Secondary | ICD-10-CM | POA: Diagnosis not present

## 2018-11-12 DIAGNOSIS — E785 Hyperlipidemia, unspecified: Secondary | ICD-10-CM | POA: Diagnosis not present

## 2018-11-12 DIAGNOSIS — I10 Essential (primary) hypertension: Secondary | ICD-10-CM | POA: Diagnosis not present

## 2018-11-14 ENCOUNTER — Encounter (HOSPITAL_COMMUNITY)
Admission: RE | Admit: 2018-11-14 | Discharge: 2018-11-14 | Disposition: A | Discharge status: Home / Self Care | Payer: Medicare Other | Source: Ambulatory Visit | Attending: Cardiology | Admitting: Cardiology

## 2018-11-14 ENCOUNTER — Other Ambulatory Visit: Payer: Self-pay

## 2018-11-14 DIAGNOSIS — E785 Hyperlipidemia, unspecified: Secondary | ICD-10-CM | POA: Diagnosis not present

## 2018-11-14 DIAGNOSIS — Z955 Presence of coronary angioplasty implant and graft: Secondary | ICD-10-CM

## 2018-11-14 DIAGNOSIS — I214 Non-ST elevation (NSTEMI) myocardial infarction: Secondary | ICD-10-CM | POA: Diagnosis not present

## 2018-11-14 DIAGNOSIS — E119 Type 2 diabetes mellitus without complications: Secondary | ICD-10-CM | POA: Diagnosis not present

## 2018-11-14 DIAGNOSIS — I10 Essential (primary) hypertension: Secondary | ICD-10-CM | POA: Diagnosis not present

## 2018-11-14 DIAGNOSIS — I25119 Atherosclerotic heart disease of native coronary artery with unspecified angina pectoris: Secondary | ICD-10-CM | POA: Diagnosis not present

## 2018-11-17 ENCOUNTER — Telehealth (HOSPITAL_COMMUNITY): Payer: Self-pay | Admitting: *Deleted

## 2018-11-17 ENCOUNTER — Ambulatory Visit (HOSPITAL_COMMUNITY): Payer: Medicare Other

## 2018-11-19 ENCOUNTER — Ambulatory Visit (HOSPITAL_COMMUNITY): Payer: Medicare Other

## 2018-11-21 ENCOUNTER — Ambulatory Visit (HOSPITAL_COMMUNITY): Payer: Medicare Other

## 2018-11-21 ENCOUNTER — Encounter (HOSPITAL_COMMUNITY): Payer: Self-pay | Admitting: *Deleted

## 2018-11-21 DIAGNOSIS — Z955 Presence of coronary angioplasty implant and graft: Secondary | ICD-10-CM

## 2018-11-26 ENCOUNTER — Encounter (HOSPITAL_COMMUNITY): Payer: Self-pay | Admitting: *Deleted

## 2018-11-26 DIAGNOSIS — Z955 Presence of coronary angioplasty implant and graft: Secondary | ICD-10-CM

## 2018-11-26 NOTE — Progress Notes (Signed)
Cardiac Individual Treatment Plan  Patient Details  Name: Brandon Vasquez MRN: [id number] Date of Birth: 03-06-1946 Referring Provider:     [place] from 08/07/2018 in [place]  Referring Provider  Dr. Ellyn Hack      Initial Encounter Date:    CARDIAC REHAB PHASE II ORIENTATION from 08/07/2018 in [place]  Date  08/07/18      Visit Diagnosis: 05/09/2018 Stented coronary artery S/P DES RPDA Graft  Patient's Home Medications on Admission:  Current Outpatient Medications:  .  amLODipine (NORVASC) 10 MG tablet, Take 1 tablet (10 mg total) by mouth daily., Disp: 90 tablet, Rfl: 4 .  aspirin 81 MG EC tablet, Take 81 mg by mouth daily. Swallow whole., Disp: , Rfl:  .  ezetimibe (ZETIA) 10 MG tablet, Take 1 tablet (10 mg total) by mouth daily., Disp: 30 tablet, Rfl: 6 .  Hypromellose (ARTIFICIAL TEARS OP), Apply 1-2 drops to eye daily as needed (dry eyes)., Disp: , Rfl:  .  isosorbide mononitrate (IMDUR) 60 MG 24 hr tablet, Take 1 tablet (60 mg total) by mouth daily., Disp: , Rfl:  .  losartan (COZAAR) 25 MG tablet, Take 1 tablet (25 mg total) by mouth daily., Disp: , Rfl:  .  metFORMIN (GLUCOPHAGE-XR) 500 MG 24 hr tablet, Take 1 tablet (500 mg total) by mouth daily., Disp: , Rfl:  .  metoprolol succinate (TOPROL-XL) 50 MG 24 hr tablet, Take 1 tablet (50 mg total) by mouth daily. Take with or immediately following a meal., Disp: 30 tablet, Rfl: 6 .  nitroGLYCERIN (NITROSTAT) 0.4 MG SL tablet, Place 1 tablet (0.4 mg total) under the tongue every 5 (five) minutes x 3 doses as needed for chest pain., Disp: 25 tablet, Rfl: 6 .  ranolazine (RANEXA) 500 MG 12 hr tablet, Take 1 tablet (500 mg total) by mouth 2 (two) times daily., Disp: 60 tablet, Rfl: 5 .  rosuvastatin (CRESTOR) 40 MG tablet, Take 1 tablet (40 mg total) by mouth daily., Disp: 90 tablet, Rfl: 3 .  ticagrelor (BRILINTA) 90 MG TABS tablet, Take 1  tablet (90 mg total) by mouth 2 (two) times daily., Disp: 60 tablet, Rfl: 5  Past Medical History: Past Medical History:  Diagnosis Date  . Coronary artery disease involving native heart with angina pectoris [facility]) 2005   a. 2005: CABG x5V in [place]  b. 04/2016: PCI/DES to SVG--> RCA. Severe Ost LCx (with CTO of grafted OMs - no retrograde filling) - distal Cx unprotected; RCA & LAD essentially CTO.;; 05/2018 - SVG-RCA ostial ISR with post-stent 80% - Overlapping DES & high Atm post-dilation of ISR. Progression of Native LM-ostLCx; 1/17/'20 - CSI - DES PCI dLM-ProxCx  2 overlapping DES  . Essential hypertension   . HCV (hepatitis C virus)    "treated" (05/08/2018)  . HLD (hyperlipidemia)   . Hx of CABG 2001   [place] - CABG 4: LIMA-LAD, SeqSVG-OM1-OM2,SVG-rPDA  . MI (myocardial infarction) ([place]) 2001  . Non-ST elevation MI (NSTEMI) ([place]) 04/2016   Culprit lesion was 90% SVG-RPDA -> DES PCI. Also noted 80% ostCx treated medically (subsequent redo PCI of SVG-RCA for ISR-sequential lesion 9/'29, follwed by Staged Atherecotmy-PCI of L-pCx in1/2020)  . Type 2 diabetes mellitus with complication (HCC)    CAD    Tobacco Use: Social History   Tobacco Use  Smoking Status Former Smoker  . Packs/day: 1.00  . Years: 30.00  . Pack years: 30.00  .  Types: Cigarettes  . Last attempt to quit: 07/10/1996  . Years since quitting: 22.3  Smokeless Tobacco Never Used    Labs: Recent Review Flowsheet Data    Labs for ITP Cardiac and Pulmonary Rehab Latest Ref Rng & Units 04/22/2016 12/12/2016 03/28/2017 11/22/2017 04/29/2018   Cholestrol 100 - 199 mg/dL 162 147 - 193 153   LDLCALC 0 - 99 mg/dL 95 73 - 115(H) 78   HDL >39 mg/dL 53 58 - 54 61   Trlycerides 0 - 149 mg/dL 69 82 - 126 70   TCO2 0 - 100 mmol/L - - 24 - -      Capillary Blood Glucose: Lab Results  Component Value Date   GLUCAP 113 (H) 11/10/2018   GLUCAP 108 (H) 09/26/2018   GLUCAP 90 09/26/2018   GLUCAP 132 (H) 09/19/2018   GLUCAP 88  09/18/2018     Exercise Target Goals: Exercise Program Goal: Individual exercise prescription set using results from initial 6 min walk test and THRR while considering  patient's activity barriers and safety.   Exercise Prescription Goal: Initial exercise prescription builds to 30-45 minutes a day of aerobic activity, 2-3 days per week.  Home exercise guidelines will be given to patient during program as part of exercise prescription that the participant will acknowledge.  Activity Barriers & Risk Stratification:   6 Minute Walk: 6 Minute Walk    Row Name 11/14/18 1528         6 Minute Walk   Phase  Discharge     Distance  1540 feet     Distance % Change  28.33 %     Walk Time  6 minutes     # of Rest Breaks  0     MPH  2.92     METS  3.21     RPE  13     Perceived Dyspnea   0     VO2 Peak  11.22     Symptoms  No     Resting HR  77 bpm     Resting BP  132/78     Max Ex. HR  92 bpm     Max Ex. BP  118/62     2 Minute Post BP  104/70        Oxygen Initial Assessment:   Oxygen Re-Evaluation:   Oxygen Discharge (Final Oxygen Re-Evaluation):   Initial Exercise Prescription:   Perform Capillary Blood Glucose checks as needed.  Exercise Prescription Changes: Exercise Prescription Changes    Row Name 09/29/18 1448 10/13/18 1450 10/27/18 1450 11/10/18 1453 11/14/18 1457     Response to Exercise   Blood Pressure (Admit)  122/70  116/76  122/70  130/70  132/78   Blood Pressure (Exercise)  140/70  122/80  138/62  104/80  160/78   Blood Pressure (Exit)  110/80  112/70  110/72  104/62  104/70   Heart Rate (Admit)  80 bpm  73 bpm  78 bpm  65 bpm  77 bpm   Heart Rate (Exercise)  108 bpm  109 bpm  112 bpm  104 bpm  110 bpm   Heart Rate (Exit)  72 bpm  80 bpm  70 bpm  75 bpm  77 bpm   Rating of Perceived Exertion (Exercise)  _0 Symptoms  none  none  none  none  none   Duration  Progress to 30 minutes of  aerobic without signs/symptoms  of physical  distress  Progress to 30 minutes of  aerobic without signs/symptoms of physical distress  Progress to 30 minutes of  aerobic without signs/symptoms of physical distress  Progress to 30 minutes of  aerobic without signs/symptoms of physical distress  Progress to 30 minutes of  aerobic without signs/symptoms of physical distress   Intensity  THRR unchanged  THRR unchanged  THRR unchanged  THRR unchanged  THRR unchanged     Progression   Progression  Continue to progress workloads to maintain intensity without signs/symptoms of physical distress.  Continue to progress workloads to maintain intensity without signs/symptoms of physical distress.  Continue to progress workloads to maintain intensity without signs/symptoms of physical distress.  Continue to progress workloads to maintain intensity without signs/symptoms of physical distress.  Continue to progress workloads to maintain intensity without signs/symptoms of physical distress.   Average METs  2.7  3.2  3.2  3.5  3.4     Resistance Training   Training Prescription  Yes  Yes  Yes  Yes  No   Weight  5lbs  5lbs  5lbs  5lbs  -   Reps  10-15  10-15  10-15  10-15  -   Time  10 Minutes  10 Minutes  10 Minutes  10 Minutes  -     Interval Training   Interval Training  No  No  No  No  No     Bike   Level  0.9  1.2  1.2  1.2  1.2   Minutes  _0 METs  2.93  3.55  3.56  3.53  3.52     NuStep   Level  _1 SPM  85  85  85  85  85   Minutes  _2 METs  2.7  3.2  2.8  3.3  3.5     Track   Laps  _3 - 1540 feet   Minutes  _4 Walk test   METs  2.49  2.92  3.09  3.79  3.23     Home Exercise Plan   Plans to continue exercise at  Home (comment) Walking  Home (comment) Walking  Home (comment) Walking  Home (comment) Walking  Home (comment) Walking   Frequency  Add 1 additional day to program exercise sessions.  Add 1 additional day to program exercise sessions.  Add 1 additional day to  program exercise sessions.  Add 1 additional day to program exercise sessions.  Add 1 additional day to program exercise sessions.   Initial Home Exercises Provided  08/20/18  08/20/18  08/20/18  08/20/18  08/20/18      Exercise Comments: Exercise Comments    Row Name 09/29/18 1509 10/13/18 1450 10/27/18 1502 11/10/18 1453     Exercise Comments  Reviewed METs and goals with patient. Patient states that he is tolerating exercise better since having stents placed on 09/18/2018.  Reviewed METs and goals with patient.  METs reviewed with patient.  Reviewed METs and goals with patient.        Exercise Goals and Review:   Exercise Goals Re-Evaluation : Exercise Goals Re-Evaluation    Row Name 09/29/18 1509 10/13/18 1450 11/10/18 1453 11/19/18 1403       Exercise  Goal Re-Evaluation   Exercise Goals Review  Increase Physical Activity;Able to understand and use rate of perceived exertion (RPE) scale;Knowledge and understanding of Target Heart Rate Range (THRR);Understanding of Exercise Prescription;Increase Strength and Stamina  Increase Physical Activity;Able to understand and use rate of perceived exertion (RPE) scale;Knowledge and understanding of Target Heart Rate Range (THRR);Understanding of Exercise Prescription;Increase Strength and Stamina  Increase Physical Activity;Able to understand and use rate of perceived exertion (RPE) scale;Knowledge and understanding of Target Heart Rate Range (THRR);Understanding of Exercise Prescription;Increase Strength and Stamina  -    Comments  Patient states that he is tolerating exercise better since having stents placed on 09/18/2018. Pt states he's able to exercise 10 minutes on NuStep without taking rest breaks like he previously had to. Pt still needing to rest on stationary bike.   Patient is making very good progress with exercise. Pt is no longer experiencing burning in chest with exercise since recent stent placement. Pt doesn't feel stamina has improved  yet, but MET level is gradually improving.  Patient states that he has not been consistently exercising at home but plans to get back into his home exercise routine, especially as he's approaching completion of the program. Pt plans to ride his staionary bike 30 minutes at least 3 days/week.  Temporary department closure due to COVID-19.    Expected Outcomes  Increase workloads as tolerated to help build strength and stamina.  Continue to progress workloads to improve cardiorespiratory fitness.  Patient will exercise 30 minutes, at least 3 days/week to help maintain health and fitness improvements.   -       Discharge Exercise Prescription (Final Exercise Prescription Changes): Exercise Prescription Changes - 11/14/18 1457      Response to Exercise   Blood Pressure (Admit)  132/78    Blood Pressure (Exercise)  160/78    Blood Pressure (Exit)  104/70    Heart Rate (Admit)  77 bpm    Heart Rate (Exercise)  110 bpm    Heart Rate (Exit)  77 bpm    Rating of Perceived Exertion (Exercise)  13    Symptoms  none    Duration  Progress to 30 minutes of  aerobic without signs/symptoms of physical distress    Intensity  THRR unchanged      Progression   Progression  Continue to progress workloads to maintain intensity without signs/symptoms of physical distress.    Average METs  3.4      Resistance Training   Training Prescription  No    Weight  --    Reps  --    Time  --      Interval Training   Interval Training  No      Bike   Level  1.2    Minutes  10    METs  3.52      NuStep   Level  5    SPM  85    Minutes  10    METs  3.5      Track   Laps  --   1540 feet   Minutes  6   Walk test   METs  3.23      Home Exercise Plan   Plans to continue exercise at  Home (comment)   Walking   Frequency  Add 1 additional day to program exercise sessions.    Initial Home Exercises Provided  08/20/18       Nutrition:  Target Goals: Understanding of nutrition guidelines, daily  intake of sodium <1578m, cholesterol <2012m calories 30% from fat and 7% or less from saturated fats, daily to have 5 or more servings of fruits and vegetables.  Biometrics:  Post Biometrics - 11/14/18 1535       Post  Biometrics   Waist Circumference  39 inches    Hip Circumference  41.5 inches    Waist to Hip Ratio  0.94 %    Triceps Skinfold  20 mm    Grip Strength  37 kg    Flexibility  7 in    Single Leg Stand  31 seconds       Nutrition Therapy Plan and Nutrition Goals:   Nutrition Assessments:   Nutrition Goals Re-Evaluation:   Nutrition Goals Re-Evaluation:   Nutrition Goals Discharge (Final Nutrition Goals Re-Evaluation):   Psychosocial: Target Goals: Acknowledge presence or absence of significant depression and/or stress, maximize coping skills, provide positive support system. Participant is able to verbalize types and ability to use techniques and skills needed for reducing stress and depression.  Initial Review & Psychosocial Screening:   Quality of Life Scores:  Scores of 19 and below usually indicate a poorer quality of life in these areas.  A difference of  2-3 points is a clinically meaningful difference.  A difference of 2-3 points in the total score of the Quality of Life Index has been associated with significant improvement in overall quality of life, self-image, physical symptoms, and general health in studies assessing change in quality of life.  PHQ-9: Recent Review Flowsheet Data    [facility]   Decreased Interest 0   Down, Depressed, Hopeless 0   PHQ - 2 Score 0     Interpretation of Total Score  Total Score Depression Severity:  1-4 = Minimal depression, 5-9 = Mild depression, 10-14 = Moderate depression, 15-19 = Moderately severe depression, 20-27 = Severe depression   Psychosocial Evaluation and Intervention:   Psychosocial Re-Evaluation: Psychosocial Re-Evaluation    [place] 10/09/18 1521 10/30/18  1744 11/21/18 1318         Psychosocial Re-Evaluation   Current issues with  Current Stress Concerns  Current Stress Concerns  Current Stress Concerns     Comments  DaShanon Browas not voiced any further increased stressors will continue to monitor the patient.  DaShanon Browas not voiced any further increased stressors will continue to monitor the patient.  [place] to assess as cardiac rehab is currently on hold     Interventions  Stress management education  Stress management education  -     Continue Psychosocial Services   No Follow up required  No Follow up required  -       Initial Review   Source of Stress Concerns  Chronic Illness  Chronic Illness  -        Psychosocial Discharge (Final Psychosocial Re-Evaluation): Psychosocial Re-Evaluation - 11/21/18 1318      Psychosocial Re-Evaluation   Current issues with  Current Stress Concerns    Comments  U[place]ble to assess as cardiac rehab is currently on hold       Vocational Rehabilitation: Provide vocational rehab assistance to qualifying candidates.   Vocational Rehab Evaluation & Intervention:   Education: Education Goals: Education classes will be provided on a weekly basis, covering required topics. Participant will state understanding/return demonstration of topics presented.  Learning Barriers/Preferences:   Education Topics: Count Your Pulse:  -Group instruction provided by verbal instruction, demonstration, patient participation and written materials to support subject.  Instructors address importance of being able to find your pulse and how to count your pulse when at home without a heart monitor.  Patients get hands on experience counting their pulse with staff help and individually.   Heart Attack, Angina, and Risk Factor Modification:  -Group instruction provided by verbal instruction, video, and written materials to support subject.  Instructors address signs and symptoms of angina and heart attacks.    Also discuss risk  factors for heart disease and how to make changes to improve heart health risk factors.   CARDIAC REHAB PHASE II EXERCISE from 11/05/2018 in [place]  Date  08/13/18  Instruction Review Code  2- Demonstrated Understanding      Functional Fitness:  -Group instruction provided by verbal instruction, demonstration, patient participation, and written materials to support subject.  Instructors address safety measures for doing things around the house.  Discuss how to get up and down off the floor, how to pick things up properly, how to safely get out of a chair without assistance, and balance training.   CARDIAC REHAB PHASE II EXERCISE from 11/05/2018 in [place]  Date  10/24/18  Educator  EP  Instruction Review Code  2- Demonstrated Understanding      Meditation and Mindfulness:  -Group instruction provided by verbal instruction, patient participation, and written materials to support subject.  Instructor addresses importance of mindfulness and meditation practice to help reduce stress and improve awareness.  Instructor also leads participants through a meditation exercise.    CARDIAC REHAB PHASE II EXERCISE from 11/05/2018 in [place]  Date  11/05/18  Educator  Mable Fill  Instruction Review Code  2- Demonstrated Understanding      Stretching for Flexibility and Mobility:  -Group instruction provided by verbal instruction, patient participation, and written materials to support subject.  Instructors lead participants through series of stretches that are designed to increase flexibility thus improving mobility.  These stretches are additional exercise for major muscle groups that are typically performed during regular warm up and cool down.   Hands Only CPR:  -Group verbal, video, and participation provides a basic overview of AHA guidelines for community CPR. Role-play of emergencies allow  participants the opportunity to practice calling for help and chest compression technique with discussion of AED use.   Hypertension: -Group verbal and written instruction that provides a basic overview of hypertension including the most recent diagnostic guidelines, risk factor reduction with self-care instructions and medication management.    Nutrition I class: Heart Healthy Eating:  -Group instruction provided by PowerPoint slides, verbal discussion, and written materials to support subject matter. The instructor gives an explanation and review of the Therapeutic Lifestyle Changes diet recommendations, which includes a discussion on lipid goals, dietary fat, sodium, fiber, plant stanol/sterol esters, sugar, and the components of a well-balanced, healthy diet.   Nutrition II class: Lifestyle Skills:  -Group instruction provided by PowerPoint slides, verbal discussion, and written materials to support subject matter. The instructor gives an explanation and review of label reading, grocery shopping for heart health, heart healthy recipe modifications, and ways to make healthier choices when eating out.   Diabetes Question & Answer:  -Group instruction provided by PowerPoint slides, verbal discussion, and written materials to support subject matter. The instructor gives an explanation and review of diabetes co-morbidities, pre- and post-prandial blood glucose goals, pre-exercise blood glucose goals, signs, symptoms, and treatment of hypoglycemia and hyperglycemia, and foot care  basics.   Diabetes Blitz:  -Group instruction provided by PowerPoint slides, verbal discussion, and written materials to support subject matter. The instructor gives an explanation and review of the physiology behind type 1 and type 2 diabetes, diabetes medications and rational behind using different medications, pre- and post-prandial blood glucose recommendations and Hemoglobin A1c goals, diabetes diet, and exercise  including blood glucose guidelines for exercising safely.    Portion Distortion:  -Group instruction provided by PowerPoint slides, verbal discussion, written materials, and food models to support subject matter. The instructor gives an explanation of serving size versus portion size, changes in portions sizes over the last 20 years, and what consists of a serving from each food group.   Stress Management:  -Group instruction provided by verbal instruction, video, and written materials to support subject matter.  Instructors review role of stress in heart disease and how to cope with stress positively.     Exercising on Your Own:  -Group instruction provided by verbal instruction, power point, and written materials to support subject.  Instructors discuss benefits of exercise, components of exercise, frequency and intensity of exercise, and end points for exercise.  Also discuss use of nitroglycerin and activating EMS.  Review options of places to exercise outside of rehab.  Review guidelines for sex with heart disease.   CARDIAC REHAB PHASE II EXERCISE from 11/05/2018 in [place]  Date  10/29/18  Educator  EP  Instruction Review Code  2- Demonstrated Understanding      Cardiac Drugs I:  -Group instruction provided by verbal instruction and written materials to support subject.  Instructor reviews cardiac drug classes: antiplatelets, anticoagulants, beta blockers, and statins.  Instructor discusses reasons, side effects, and lifestyle considerations for each drug class.   CARDIAC REHAB PHASE II EXERCISE from 11/05/2018 in [place]  Date  09/10/18  Educator  Pharmacist  Instruction Review Code  2- Demonstrated Understanding      Cardiac Drugs II:  -Group instruction provided by verbal instruction and written materials to support subject.  Instructor reviews cardiac drug classes: angiotensin converting enzyme inhibitors  (ACE-I), angiotensin II receptor blockers (ARBs), nitrates, and calcium channel blockers.  Instructor discusses reasons, side effects, and lifestyle considerations for each drug class.   Anatomy and Physiology of the Circulatory System:  Group verbal and written instruction and models provide basic cardiac anatomy and physiology, with the coronary electrical and arterial systems. Review of: AMI, Angina, Valve disease, Heart Failure, Peripheral Artery Disease, Cardiac Arrhythmia, Pacemakers, and the ICD.   CARDIAC REHAB PHASE II EXERCISE from 11/05/2018 in [place]  Date  10/08/18  Instruction Review Code  2- Demonstrated Understanding      Other Education:  -Group or individual verbal, written, or video instructions that support the educational goals of the cardiac rehab program.   Holiday Eating Survival Tips:  -Group instruction provided by PowerPoint slides, verbal discussion, and written materials to support subject matter. The instructor gives patients tips, tricks, and techniques to help them not only survive but enjoy the holidays despite the onslaught of food that accompanies the holidays.   Knowledge Questionnaire Score:   Core Components/Risk Factors/Patient Goals at Admission:   Core Components/Risk Factors/Patient Goals Review:  Goals and Risk Factor Review    Row Name 10/09/18 1521 10/30/18 1744 11/21/18 1319         Core Components/Risk Factors/Patient Goals Review   Personal Goals Review  Weight Management/Obesity;Hypertension;Diabetes  Weight Management/Obesity;Hypertension;Diabetes  Weight Management/Obesity;Hypertension;Diabetes     Review  Brandon Vasquez has been doing better with exercise [name]'s vital signs have been stable. Brandon Vasquez has been doing well since his recent stenting.  Brandon Vasquez has been doing better with exercise [name]'s vital signs have been stable. Brandon Vasquez has been doing well since his recent stenting.  Exercise is currently on hold  as department is closed per reccomended guidelines from the [place] the spread of COVID-19     Expected Outcomes  Brandon Vasquez will continue to participate in phase 2 cardiac rehab for exercise nutrtion and lifestyle modfications  Brandon Vasquez will continue to participate in phase 2 cardiac rehab for exercise nutrtion and lifestyle modfications  Brandon Vasquez will continue to participate in phase 2 cardiac rehab for exercise nutrtion and lifestyle modfications once exercise resumes. Brandon Vasquez only has a few sessions left to complete the program        Core Components/Risk Factors/Patient Goals at Discharge (Final Review):  Goals and Risk Factor Review - 11/21/18 1319      Core Components/Risk Factors/Patient Goals Review   Personal Goals Review  Weight Management/Obesity;Hypertension;Diabetes    Review  Exercise is currently on hold as department is closed per reccomended guidelines from the [place] the spread of COVID-19    Expected Outcomes  Brandon Vasquez will continue to participate in phase 2 cardiac rehab for exercise nutrtion and lifestyle modfications once exercise resumes. Brandon Vasquez only has a few sessions left to complete the program       ITP Comments: ITP Comments    Row Name 10/09/18 1521 10/30/18 1744 11/21/18 1315       ITP Comments  30 Day ITP Review. Brandon Vasquez is good attendance and participation in phase 2 cardiac rehab.  30 Day ITP Review. Brandon Vasquez is good attendance and participation in phase 2 cardiac rehab.  30 Day ITP Review. Exercise is currently on hold as department is closed per reccomended guidelines from the [place] the spread of COVID-19        Comments: See ITP comments.Barnet Pall, RN,BSN 11/26/2018 8:56 AM

## 2018-12-12 ENCOUNTER — Telehealth (HOSPITAL_COMMUNITY): Payer: Self-pay | Admitting: *Deleted

## 2018-12-12 NOTE — Telephone Encounter (Signed)
Called to notify patient that the cardiac and pulmonary rehabilitation department remains closed at this time due to COVID-19 restrictions. Message left on voicemail.   Sol Passer, MS, ACSM CEP 12/12/2018 1335

## 2018-12-31 ENCOUNTER — Other Ambulatory Visit: Payer: Self-pay | Admitting: Cardiology

## 2019-01-28 ENCOUNTER — Telehealth (HOSPITAL_COMMUNITY): Payer: Self-pay | Admitting: *Deleted

## 2019-02-02 NOTE — Addendum Note (Signed)
Encounter addended by: Ivonne Andrew, RD on: 02/02/2019 4:16 PM  Actions taken: Flowsheet data copied forward, Visit Navigator Flowsheet section accepted

## 2019-02-03 ENCOUNTER — Encounter (HOSPITAL_COMMUNITY): Payer: Self-pay | Admitting: *Deleted

## 2019-02-03 DIAGNOSIS — Z955 Presence of coronary angioplasty implant and graft: Secondary | ICD-10-CM

## 2019-02-03 NOTE — Progress Notes (Signed)
Discharge Progress Report  Patient Details  Name: Brandon Vasquez MRN: [id number] Date of Birth: 01/26/1946 Referring Provider:     [facility] [facility] from 08/07/2018 in [place]  Referring Provider  Dr. Ellyn Hack       Number of Visits: 30  Reason for Discharge:  Patient independent in their exercise.  Smoking History:  Social History   Tobacco Use  Smoking Status Former Smoker  . Packs/day: 1.00  . Years: 30.00  . Pack years: 30.00  . Types: Cigarettes  . Last attempt to quit: 07/10/1996  . Years since quitting: 22.5  Smokeless Tobacco Never Used    Diagnosis:  05/09/2018 Stented coronary artery S/P DES RPDA Graft  ADL UCSD:   Initial Exercise Prescription:   Discharge Exercise Prescription (Final Exercise Prescription Changes):   Functional Capacity:   Psychological, QOL, Others - Outcomes: PHQ 2/9: Depression screen PHQ 2/9 08/13/2018  Decreased Interest 0  Down, Depressed, Hopeless 0  PHQ - 2 Score 0    Quality of Life:   Personal Goals: Goals established at orientation with interventions provided to work toward goal.    Personal Goals Discharge: Goals and Risk Factor Review    Row Name 02/03/19 0919             Core Components/Risk Factors/Patient Goals Review   Personal Goals Review  Weight Management/Obesity;Hypertension;Diabetes       Review  [name] felt better after his stent placement in January. Cardiac rehab closed due to the [place] 19 Pandemic in March 2020       Expected Outcomes  [name] will continue continue home exercise on his own and follow risk factor modfications.          Exercise Goals and Review:   Exercise Goals Re-Evaluation:   Nutrition & Weight - Outcomes:    Nutrition:   Nutrition Discharge:   Education Questionnaire Score:   [name] attended 30 exercise sessions between 08/07/18-11/14/18. [name]'s exercise tolerance improved after his stenting on 09/18/18.  [name] improved his distance on his post exercise walk test and maintained his weight. We are proud of [name]'s progress. Cardiac rehab closed in March due to the [place] 19 Pandemic. [name] only had 2 exercise sessions left to complete cardiac rehab.Barnet Pall, RN,BSN 02/03/2019 9:32 AM

## 2019-02-09 ENCOUNTER — Telehealth: Payer: Self-pay | Admitting: Cardiology

## 2019-02-10 ENCOUNTER — Telehealth: Payer: Self-pay

## 2019-02-10 NOTE — Telephone Encounter (Signed)
Left vm for pt to return call to make appt to virtual

## 2019-02-12 ENCOUNTER — Telehealth: Payer: Self-pay | Admitting: *Deleted

## 2019-02-12 ENCOUNTER — Encounter: Payer: Self-pay | Admitting: Cardiology

## 2019-02-12 ENCOUNTER — Telehealth (INDEPENDENT_AMBULATORY_CARE_PROVIDER_SITE_OTHER): Payer: Medicare Other | Admitting: Cardiology

## 2019-02-12 VITALS — BP 127/76 | HR 51 | Ht 73.0 in | Wt 188.0 lb

## 2019-02-12 DIAGNOSIS — Z95820 Peripheral vascular angioplasty status with implants and grafts: Secondary | ICD-10-CM

## 2019-02-12 DIAGNOSIS — I25719 Atherosclerosis of autologous vein coronary artery bypass graft(s) with unspecified angina pectoris: Secondary | ICD-10-CM

## 2019-02-12 DIAGNOSIS — I10 Essential (primary) hypertension: Secondary | ICD-10-CM

## 2019-02-12 DIAGNOSIS — E785 Hyperlipidemia, unspecified: Secondary | ICD-10-CM

## 2019-02-12 DIAGNOSIS — I25119 Atherosclerotic heart disease of native coronary artery with unspecified angina pectoris: Secondary | ICD-10-CM

## 2019-02-12 NOTE — Progress Notes (Signed)
Virtual Visit via Video Note   This visit type was conducted due to national recommendations for restrictions regarding the COVID-19 Pandemic (e.g. social distancing) in an effort to limit this patient's exposure and mitigate transmission in our community.  Due to his co-morbid illnesses, this patient is at least at moderate risk for complications without adequate follow up.  This format is felt to be most appropriate for this patient at this time.  All issues noted in this document were discussed and addressed.  A limited physical exam was performed with this format.  Please refer to the patient's chart for his consent to telehealth for [facility].   Patient has given verbal permission to conduct this visit via virtual appointment and to bill insurance 02/12/2019 4:10 PM     Evaluation Performed:  Follow-up visit  Date:  02/12/2019   ID:  Brandon Vasquez, DOB 1946/06/25, MRN [id number]  Patient Location: Home Provider Location: Home  PCP:  Rikki Spearing, NP  Cardiologist:  [name], MD  Electrophysiologist:  None   Chief Complaint: 3-76-monthfollow-up post PCI.  Doing well no complaints  History of Present Illness:    Brandon Vasquez a 73y.o. male with PMH notable for CAD-CABG-PCI who presents via audio/video conferencing for a telehealth visit today.   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 -->NSTEMI IN 04/2016 ->PCI to SVG-RCA, med Rx of Native Cx disease.  ? Myoview stress test November 2018: EF 44%. Medium size moderate severity fixed defect in the basal inferolateral mid inferolateral wall consistent with prior infarct with mild peri-infarct ischemia. LOW RISK. ? Progressive Angina - 05/08/2018: Cath - ISR SVG-RCA =-> DES PCI & PTCA; non-revascularized Native Cx - ostial 90% (mild  progression) --> subsequently failed medical management.  Myoview November 2019: EF 48%.  Small inferior-lateral wall infarct apex to base with septal thinning at base.  Thought to be consistent with prior MI but no ischemia.   (On my review, this appeared to have clear evidence of at least mild ischemia)  He also has hypertension, hyperlipidemia and type 2 diabetes mellitus - Was converted from lisinopril to amlodipine plus HCTZ for hypotension/dizziness  Brandon BRISKIwas last seen in March post-PCI  [facility] [employer] [facility] day short before COVID-19   Interval History:  Keeping active - rides bike, mowing lawn & washing cars.  NO more problems with any more Chest Pain/ burning at all.   Cardiovascular ROS: no chest pain or dyspnea on exertion negative for - edema, irregular heartbeat, orthopnea, palpitations, paroxysmal nocturnal dyspnea, rapid heart rate, shortness of breath or Syncope/near syncope, TIA/amaurosis fugax, claudication  The patient does not have symptoms concerning for COVID-19 infection (fever, chills, cough, or new shortness of breath).  The patient is practicing social distancing.  Has most foods delivered.   ROS:  Please see the history of present illness.    Review of Systems  Constitutional: Negative for malaise/fatigue and weight loss.  HENT: Negative for congestion and nosebleeds.   Respiratory: Negative for cough and shortness of breath.   Cardiovascular: Negative for claudication.  Gastrointestinal: Negative for blood in stool, heartburn, melena, nausea and vomiting.  Genitourinary: Negative for hematuria.  Musculoskeletal: Negative.   Neurological: Negative for dizziness and headaches.  Endo/Heme/Allergies: Negative for environmental allergies. Does not bruise/bleed easily.  Psychiatric/Behavioral: Negative.     Past Medical History:  Diagnosis Date  . Coronary artery disease involving native heart with angina pectoris [facility]) 2005   a. 2005: CABG  x5V in [place]  b. 04/2016: PCI/DES to SVG--> RCA. Severe Ost LCx (with CTO of grafted OMs - no retrograde filling) - distal Cx unprotected; RCA & LAD essentially CTO.;; 05/2018 - SVG-RCA ostial ISR with post-stent 80% - Overlapping DES & high Atm post-dilation of ISR. Progression of Native LM-ostLCx; 1/17/'20 - CSI - DES PCI dLM-ProxCx  2 overlapping DES  . Essential hypertension   . HCV (hepatitis C virus)    "treated" (05/08/2018)  . HLD (hyperlipidemia)   . Hx of CABG 2001   [place] - CABG 4: LIMA-LAD, SeqSVG-OM1-OM2,SVG-rPDA  . MI (myocardial infarction) ([place]) 2001  . Non-ST elevation MI (NSTEMI) ([place]) 04/2016   Culprit lesion was 90% SVG-RPDA -> DES PCI. Also noted 80% ostCx treated medically (subsequent redo PCI of SVG-RCA for ISR-sequential lesion 9/'29, follwed by Staged Atherecotmy-PCI of L-pCx in1/2020)  . Type 2 diabetes mellitus with complication (HCC)    CAD   Past Surgical History:  Procedure Laterality Date  . CARDIAC CATHETERIZATION N/A 04/23/2016   Procedure: Left Heart Cath and Cors/Grafts Angiography;  Surgeon: Jettie Booze, MD;  Location: [place] [facility];  Service: Cardiovascular: ostLAD 75%->pLAD 100%, ostOM1 & OM2 100%, pRCA 80% -> dRCA 50%; LIMA-LAD patent, SVG-OM1-OM2 ~20% prox otw patentt, ost-pSVG-rPDA 90% --> PCI  . CARDIAC CATHETERIZATION N/A 04/23/2016   Procedure: Coronary Stent Intervention;  Surgeon: Jettie Booze, MD;  Location: [place] [facility];  Service: Cardiovascular;  Laterality: N/A;  DES PCI - 90% pSVG- PDA (Synergy DES 3.5 x 20)  . CORONARY ARTERY BYPASS GRAFT  2001   LIMA-LAD, SeqSVG-OM1-OM2,SVG-rPDA  . CORONARY ATHERECTOMY N/A 09/18/2018   Procedure: CORONARY ATHERECTOMY (ORBITAL);  Surgeon: Leonie Man, MD;  Location: [place] [facility];  Service: Cardiovascular: dLM-OstCx CSI -OCT - PCI - OCT.   Marland Kitchen CORONARY STENT INTERVENTION N/A 05/08/2018   Procedure: CORONARY STENT INTERVENTION;  Surgeon: Leonie Man, MD;  Location: [facility] [facility];  Service: Cardiovascular;; Ost SVG-RPDA-PL 70% ISR & 75-80% just beyond stent --> Synergy DES 3.5 x 12 (overlapping) with 4.0 mm post-dilation of overlap & entire old stent (especially the [place])  . CORONARY STENT INTERVENTION N/A 09/18/2018   Procedure: CORONARY STENT INTERVENTION;  Surgeon: Leonie Man, MD;  Location: [place] [facility];  Service: Cardiovascular: 2 overlapping DES stent placement (Osiro 3.0 x 32 mm & 3.5 x 23 mm -postdilated and tapered fashion from 4.1 to 2.8 mm)  . INTRAVASCULAR ULTRASOUND/IVUS N/A 09/18/2018   Procedure: Intravascular OCT;  Surgeon: Leonie Man, MD;  Location: [place] [facility];  Service: Cardiovascular: Post CSI Atherectomy OCT identified 2 additional downstream lesions for PCI; post PCI with overlapping DES OCT used to ensure stent apposition /expansion. -- small plaque thrombus extruding through stent @ most distal lesion -- IV Heparin overnight   . LAPAROSCOPIC CHOLECYSTECTOMY    . LEFT HEART CATH AND CORS/GRAFTS ANGIOGRAPHY N/A 05/08/2018   Procedure: LEFT HEART CATH AND CORS/GRAFTS ANGIOGRAPHY;  Surgeon: Leonie Man, MD;  Location: [place] [facility];  Service: Cardiovascular;; SVG-RPDA-PL 70% ostial ISR & 75% aftter stent; Progression of dLM-OstLCx to ~90-95%.-> PCI of SVG-RPDA-PL. RCA & LAD CTO.  Patent SeqSVG-OM1-OM2 (native OMs ostial CTO - no retrograde flow), Patent LIMA-mLAD  . NM MYOVIEW LTD  07/2018   Myoview 07/15/2018:  EF 45-50%. Small size fixed  inferolateral wall defect c/w prior MI. NO ischemia.  LOW RISK  . TRANSTHORACIC ECHOCARDIOGRAM  04/2016   EF 60-65%. GR 2 DD. Inferior hypokinesis. No significant valvular lesions    09/19/2018: PCI LM-Cx (CSI, OCT) 0-- 2 overlapping DES Orsiro 3.5 x 22; 3.0 x 30     Current Meds  Medication Sig  . amLODipine (NORVASC) 10 MG tablet Take 1 tablet (10 mg total) by mouth daily.  Marland Kitchen BRILINTA 90 MG TABS tablet TAKE ONE TABLET BY MOUTH TWICE A DAY  . ezetimibe (ZETIA) 10 MG  tablet Take 1 tablet (10 mg total) by mouth daily.  . Hypromellose (ARTIFICIAL TEARS OP) Apply 1-2 drops to eye daily as needed (dry eyes).  . isosorbide mononitrate (IMDUR) 60 MG 24 hr tablet Take 1 tablet (60 mg total) by mouth daily.  Marland Kitchen losartan (COZAAR) 25 MG tablet Take 1 tablet (25 mg total) by mouth daily.  . metFORMIN (GLUCOPHAGE-XR) 500 MG 24 hr tablet Take 1 tablet (500 mg total) by mouth daily.  . metoprolol succinate (TOPROL-XL) 50 MG 24 hr tablet Take 1 tablet (50 mg total) by mouth daily. Take with or immediately following a meal.  . nitroGLYCERIN (NITROSTAT) 0.4 MG SL tablet Place 1 tablet (0.4 mg total) under the tongue every 5 (five) minutes x 3 doses as needed for chest pain.  . ranolazine (RANEXA) 500 MG 12 hr tablet Take 1 tablet (500 mg total) by mouth 2 (two) times daily.  . rosuvastatin (CRESTOR) 40 MG tablet Take 1 tablet (40 mg total) by mouth daily.     Allergies:   Patient has no known allergies.   Social History   Tobacco Use  . Smoking status: Former Smoker    Packs/day: 1.00    Years: 30.00    Pack years: 30.00    Types: Cigarettes    Quit date: 07/10/1996    Years since quitting: 22.6  . Smokeless tobacco: Never Used  Substance Use Topics  . Alcohol use: Yes    Comment: 05/08/2018 "couple drinks/yyear"  . Drug use: Not Currently    Types: Marijuana    Comment: 05/08/2018 "nothing in 2019"     Family Hx: The patient's family history includes Alzheimer's disease in his father; Cerebral aneurysm in his mother; Hypertension in his mother.   Prior CV studies:   The following studies were reviewed today: . None:  Labs/Other Tests and Data Reviewed:    EKG:  No ECG reviewed.  Recent Labs: 03/25/2018: TSH 1.450 04/29/2018: ALT 24 09/19/2018: BUN 19; Creatinine, Ser 1.54; Hemoglobin 9.7; Platelets 172; Potassium 3.8; Sodium 138   Recent Lipid Panel Lab Results  Component Value Date/Time   CHOL 153 04/29/2018 03:13 PM   TRIG 70 04/29/2018 03:13 PM    HDL 61 04/29/2018 03:13 PM   CHOLHDL 2.5 04/29/2018 03:13 PM   CHOLHDL 3.6 11/22/2017 10:30 AM   LDLCALC 78 04/29/2018 03:13 PM   LDLCALC 115 (H) 11/22/2017 10:30 AM    Wt Readings from Last 3 Encounters:  02/12/19 188 lb (85.3 kg)  10/09/18 193 lb (87.5 kg)  09/19/18 188 lb 4.4 oz (85.4 kg)     Objective:    Vital Signs:  BP 127/76   Pulse (!) 51   Ht '6\' 1"'  (1.854 m)   Wt 188 lb (85.3 kg)   BMI 24.80 kg/m   VITAL SIGNS:  reviewed GEN:  Well nourished, well developed male in no acute distress. RESPIRATORY:  normal respiratory  effort, symmetric expansion CARDIOVASCULAR:  no peripheral edema NEURO:  alert and oriented x 3, no obvious focal deficit PSYCH:  normal affect   ASSESSMENT & PLAN:    Problem List Items Addressed This Visit    Coronary artery disease involving autologous vein coronary bypass graft with angina pectoris ([place]) - Primary (Chronic)   Relevant Orders   Lipid panel   Comprehensive metabolic panel   Coronary artery disease involving native heart with angina pectoris (HCC) (Chronic)   Relevant Orders   Lipid panel   Comprehensive metabolic panel   Dyslipidemia, goal LDL below 70 (Chronic)   Relevant Orders   Lipid panel   Comprehensive metabolic panel   Essential hypertension (Chronic)   S/P angioplasty with stent distal LM and LCX. 09/18/18 (Chronic)   Relevant Orders   Comprehensive metabolic panel     Brandon Vasquez is doing very well.  No further angina symptoms after most recent PCI.  His blood pressure looks great and his lipids of May with notable improvement.  No angina or heart failure symptoms.  Trying to stay active.  Plan: No further cardiac evaluation needed at this time.  Check lipid panel and chemistry panel in July to reevaluate lipids.  No change to blood pressure medications including amlodipine, Toprol and losartan.  We will have him wean off of Imdur -for 2 weeks he will reduce from 90 down to 60 mg daily, then if doing okay with no  angina, 2 weeks at 60 mg daily, and then if stable wean down to 30 mg daily.  If doing well after 2 more weeks stop.  1 month after stopping Imdur, we will then wean off Ranexa-2 weeks taking once daily in the morning, if doing well no angina, then stop the daytime dose.  If anginal symptoms recur with any of this weaning he will simply go back to the previous level where there were no symptoms.  Continue Brilinta at current treatment dose without aspirin until 1 year out from her most recent PCI.  At that time he plans to reduce to 60 mg twice daily maintenance dose but would be okay to hold for procedures.  (As of July would be okay to hold for urgent procedures only 5 to 7 days preop)  Follow-up in December-January timeframe.   COVID-19 Education: The signs and symptoms of COVID-19 were discussed with the patient and how to seek care for testing (follow up with PCP or arrange E-visit).   The importance of social distancing was discussed today.  Time:   Today, I have spent 20 minutes with the patient with telehealth technology discussing the above problems.     Medication Adjustments/Labs and Tests Ordered: Current medicines are reviewed at length with the patient today.  Concerns regarding medicines are outlined above.  Medication Instructions:    We will have him wean off of Imdur -for 2 weeks he will reduce from 90 down to 60 mg daily, then if doing okay with no angina, 2 weeks at 60 mg daily, and then if stable wean down to 30 mg daily.  If doing well after 2 more weeks stop.  1 month after stopping Imdur, we will then wean off Ranexa-2 weeks taking once daily in the morning, if doing well no angina, then stop the daytime dose.  If anginal symptoms recur with any of this weaning he will simply go back to the previous level where there were no symptoms.    Continue Brilinta at current treatment dose without aspirin  until 1 year out from her most recent PCI.  At that time he plans  to reduce to 60 mg twice daily maintenance dose but would be okay to hold for procedures.  (As of July would be okay to hold for urgent procedures only 5 to 7 days preop)  Tests Ordered: Orders Placed This Encounter  Procedures  . Lipid panel  . Comprehensive metabolic panel   Fasting lipid panel and chemistry panel to be checked in June-July.  Medication Changes: No orders of the defined types were placed in this encounter. See above  Disposition:  Follow up In December-January    Signed, [name], MD  02/12/2019 4:10 PM    [place]

## 2019-02-12 NOTE — Telephone Encounter (Signed)
Spoke with patient . Patient aware of upcoming appt today - it has been changed to virtaul appt. Patient has consented to appt. Aware will call between 1 and 1:15 today - 02/12/19.   dox-text --will  Use.

## 2019-02-12 NOTE — Patient Instructions (Addendum)
Medication Instructions:    We will have him wean off of Imdur -for 2 weeks he will reduce from 90 down to 60 mg daily, then if doing okay with no angina, 2 weeks at 60 mg daily, and then if stable wean down to 30 mg daily.  If doing well after 2 more weeks stop.  1 month after stopping Imdur, we will then wean off Ranexa-2 weeks taking once daily in the morning, if doing well no angina, then stop the daytime dose.  If anginal symptoms recur with any of this weaning he will simply go back to the previous level where there were no symptoms.   Continue Brilinta at current treatment dose without aspirin until 1 year out from her most recent PCI.  At that time he plans to reduce to 60 mg twice daily maintenance dose but would be okay to hold for procedures.  (As of July would be okay to hold for urgent procedures only 5 to 7 days preop)    If you need a refill on your cardiac medications before your next appointment, please call your pharmacy.   Lab work: LIPID AND CMP  June OR July - WILL MAIL YOU LABSLIP  If you have labs (blood work) drawn today and your tests are completely normal, you will receive your results only by: Marland Kitchen MyChart Message (if you have MyChart) OR . A paper copy in the mail If you have any lab test that is abnormal or we need to change your treatment, we will call you to review the results.  Testing/Procedures:   NOT NEEDED  Follow-Up: At [facility], you and your health needs are our priority.  As part of our continuing mission to provide you with exceptional heart care, we have created designated Provider Care Teams.  These Care Teams include your primary Cardiologist (physician) and Advanced Practice Providers (APPs -  Physician Assistants and Nurse Practitioners) who all work together to provide you with the care you need, when you need it. . You will need a follow up appointment in Dec 2020 - Jan 2021.  Please call our office 2 months in advance to schedule this  appointment.  You may see [name], MD or one of the following Advanced Practice Providers on your designated Care Team:   . Rosaria Ferries, PA-C . Jory Sims, DNP, ANP  Any Other Special Instructions Will Be Listed Below (If Applicable).

## 2019-02-12 NOTE — Telephone Encounter (Signed)
Spoke to  patient,  Instruction given  Tele-visit  from 02/12/19 -- avs summary and labslip will be mailed to patient as well as sent via mychart. Patient verbalized understanding.

## 2019-03-02 DIAGNOSIS — H52201 Unspecified astigmatism, right eye: Secondary | ICD-10-CM | POA: Diagnosis not present

## 2019-03-02 DIAGNOSIS — H04123 Dry eye syndrome of bilateral lacrimal glands: Secondary | ICD-10-CM | POA: Diagnosis not present

## 2019-03-02 DIAGNOSIS — H5203 Hypermetropia, bilateral: Secondary | ICD-10-CM | POA: Diagnosis not present

## 2019-03-02 DIAGNOSIS — H524 Presbyopia: Secondary | ICD-10-CM | POA: Diagnosis not present

## 2019-03-24 ENCOUNTER — Other Ambulatory Visit: Payer: Self-pay | Admitting: Physician Assistant

## 2019-03-24 DIAGNOSIS — E785 Hyperlipidemia, unspecified: Secondary | ICD-10-CM

## 2019-03-27 DIAGNOSIS — N3090 Cystitis, unspecified without hematuria: Secondary | ICD-10-CM | POA: Diagnosis not present

## 2019-03-27 DIAGNOSIS — Z87891 Personal history of nicotine dependence: Secondary | ICD-10-CM | POA: Diagnosis not present

## 2019-03-27 DIAGNOSIS — K909 Intestinal malabsorption, unspecified: Secondary | ICD-10-CM | POA: Diagnosis not present

## 2019-03-27 DIAGNOSIS — R197 Diarrhea, unspecified: Secondary | ICD-10-CM | POA: Diagnosis not present

## 2019-03-30 ENCOUNTER — Other Ambulatory Visit: Payer: Self-pay | Admitting: Physician Assistant

## 2019-04-21 ENCOUNTER — Other Ambulatory Visit: Payer: Self-pay | Admitting: Cardiology

## 2019-05-04 ENCOUNTER — Other Ambulatory Visit: Payer: Self-pay | Admitting: Cardiology

## 2019-05-14 ENCOUNTER — Other Ambulatory Visit: Payer: Self-pay | Admitting: Cardiology

## 2019-05-15 DIAGNOSIS — Z87891 Personal history of nicotine dependence: Secondary | ICD-10-CM | POA: Diagnosis not present

## 2019-05-15 DIAGNOSIS — I25119 Atherosclerotic heart disease of native coronary artery with unspecified angina pectoris: Secondary | ICD-10-CM | POA: Diagnosis not present

## 2019-05-15 DIAGNOSIS — I10 Essential (primary) hypertension: Secondary | ICD-10-CM | POA: Diagnosis not present

## 2019-05-15 DIAGNOSIS — E119 Type 2 diabetes mellitus without complications: Secondary | ICD-10-CM | POA: Diagnosis not present

## 2019-05-15 DIAGNOSIS — Z7984 Long term (current) use of oral hypoglycemic drugs: Secondary | ICD-10-CM | POA: Diagnosis not present

## 2019-05-15 DIAGNOSIS — I209 Angina pectoris, unspecified: Secondary | ICD-10-CM | POA: Diagnosis not present

## 2019-05-15 DIAGNOSIS — Z23 Encounter for immunization: Secondary | ICD-10-CM | POA: Diagnosis not present

## 2019-08-03 DIAGNOSIS — Z7984 Long term (current) use of oral hypoglycemic drugs: Secondary | ICD-10-CM | POA: Diagnosis not present

## 2019-08-03 DIAGNOSIS — E079 Disorder of thyroid, unspecified: Secondary | ICD-10-CM | POA: Diagnosis not present

## 2019-08-03 DIAGNOSIS — R809 Proteinuria, unspecified: Secondary | ICD-10-CM | POA: Diagnosis not present

## 2019-08-03 DIAGNOSIS — I25119 Atherosclerotic heart disease of native coronary artery with unspecified angina pectoris: Secondary | ICD-10-CM | POA: Diagnosis not present

## 2019-08-03 DIAGNOSIS — I10 Essential (primary) hypertension: Secondary | ICD-10-CM | POA: Diagnosis not present

## 2019-08-03 DIAGNOSIS — Z87891 Personal history of nicotine dependence: Secondary | ICD-10-CM | POA: Diagnosis not present

## 2019-08-03 DIAGNOSIS — E119 Type 2 diabetes mellitus without complications: Secondary | ICD-10-CM | POA: Diagnosis not present

## 2019-08-03 DIAGNOSIS — D509 Iron deficiency anemia, unspecified: Secondary | ICD-10-CM | POA: Diagnosis not present

## 2019-08-03 DIAGNOSIS — E7800 Pure hypercholesterolemia, unspecified: Secondary | ICD-10-CM | POA: Diagnosis not present

## 2019-08-03 DIAGNOSIS — B182 Chronic viral hepatitis C: Secondary | ICD-10-CM | POA: Diagnosis not present

## 2019-08-11 ENCOUNTER — Other Ambulatory Visit: Payer: Self-pay | Admitting: Physician Assistant

## 2019-08-24 ENCOUNTER — Other Ambulatory Visit: Payer: Self-pay | Admitting: Cardiology

## 2019-08-24 NOTE — Telephone Encounter (Signed)
Rx(s) sent to pharmacy electronically.  

## 2019-09-08 ENCOUNTER — Other Ambulatory Visit: Payer: Self-pay | Admitting: Cardiology

## 2019-10-15 DIAGNOSIS — L299 Pruritus, unspecified: Secondary | ICD-10-CM | POA: Diagnosis not present

## 2019-11-17 ENCOUNTER — Other Ambulatory Visit: Payer: Self-pay

## 2019-11-17 ENCOUNTER — Ambulatory Visit (INDEPENDENT_AMBULATORY_CARE_PROVIDER_SITE_OTHER): Payer: Medicare Other | Admitting: Cardiology

## 2019-11-17 ENCOUNTER — Encounter: Payer: Self-pay | Admitting: Cardiology

## 2019-11-17 VITALS — BP 130/74 | HR 58 | Temp 98.1°F | Ht 73.0 in | Wt 201.2 lb

## 2019-11-17 DIAGNOSIS — I214 Non-ST elevation (NSTEMI) myocardial infarction: Secondary | ICD-10-CM

## 2019-11-17 DIAGNOSIS — I10 Essential (primary) hypertension: Secondary | ICD-10-CM

## 2019-11-17 DIAGNOSIS — I25719 Atherosclerosis of autologous vein coronary artery bypass graft(s) with unspecified angina pectoris: Secondary | ICD-10-CM | POA: Diagnosis not present

## 2019-11-17 DIAGNOSIS — Z951 Presence of aortocoronary bypass graft: Secondary | ICD-10-CM | POA: Diagnosis not present

## 2019-11-17 DIAGNOSIS — I25119 Atherosclerotic heart disease of native coronary artery with unspecified angina pectoris: Secondary | ICD-10-CM | POA: Diagnosis not present

## 2019-11-17 DIAGNOSIS — Z95820 Peripheral vascular angioplasty status with implants and grafts: Secondary | ICD-10-CM

## 2019-11-17 DIAGNOSIS — R5383 Other fatigue: Secondary | ICD-10-CM | POA: Diagnosis not present

## 2019-11-17 DIAGNOSIS — E785 Hyperlipidemia, unspecified: Secondary | ICD-10-CM | POA: Diagnosis not present

## 2019-11-17 DIAGNOSIS — E119 Type 2 diabetes mellitus without complications: Secondary | ICD-10-CM | POA: Diagnosis not present

## 2019-11-17 MED ORDER — LOSARTAN POTASSIUM 50 MG PO TABS: 50.0000 mg | ORAL_TABLET | Freq: Every day | ORAL | 3 refills | Status: DC

## 2019-11-17 MED ORDER — METOPROLOL SUCCINATE ER 25 MG PO TB24: 25.0000 mg | ORAL_TABLET | Freq: Every day | ORAL | 3 refills | Status: DC

## 2019-11-17 NOTE — Progress Notes (Signed)
Primary Care Provider: Rikki Spearing, NP Cardiologist: [name], MD Electrophysiologist: None  Clinic Note: Chief Complaint  Patient presents with  . Follow-up    Routine.  No complaints  . Coronary Artery Disease    No angina since last PCI    HPI:    LUISALBERTO BEEGLE is a 74 y.o. male with a cardiac PMH below who presents today for 6-49-monthfollow-up.   MULTIVESSEL CORONARY ARTERY DISEASEstatus post CABG 4 (LIMA-LAD {could be to D1-L, SVG-RPDA, SVG-OM1-OM 2) [facility]presenting with cardiac arrest and emergent catheterization  ? -->NSTEMI IN 04/2016 ->PCI to SVG-RCA, med Rx of Native Cx disease.  ? Myoview stress test November 2018: EF 44%. Medium size moderate severity fixed defect in the basal inferolateral mid inferolateral wall consistent with prior infarct with mild peri-infarct ischemia. LOW RISK. ? Progressive Angina - 05/08/2018: Cath - ISR SVG-RCA =-> DES PCI & PTCA; non-revascularized Native Cx - ostial 90% (mild progression)-->subsequently failed medical management.  Myoview November 2019:EF 48%. Small inferior-lateral wall infarct apex to base with septal thinning at base. Thought to be consistent with prior MI but no ischemia.   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  He was doing well.  Staying active riding his bike, mowing the lawn and washing cars.  Noted no further episodes of pain chest pain or burning sensation which was his anginal equivalent.  Plan was to wean off Imdur and Ranexa unless symptoms recur.  Discontinued aspirin with plans to continue uninterrupted Brilinta until January 2021 -> plan was to reduce dose at 1  year point  Recent Hospitalizations: None  Reviewed  CV studies:    The following studies were reviewed today: (if available, images/films reviewed: From Epic Chart or Care Everywhere) . None:  Interval History:   DORAN DILLENBURGpresents here today doing very well from a cardiac standpoint.  He is very happy with no major complaints.  He just been moaning the fact that he is not able to get out and do as much as he would like to do and starting to get "lazy "because of all this Covid restrictions.  He is very happy that he is just had his first vaccine done and due to get a second dose today, after his visit..Marland Kitchen He is really hoping that we will give him some freedom to go out and do more activities and be more sociable. He still does walk quite a bit, and rides a stationary bike but has been a little bit more lazy of late.  He is worried because he has gained quite a bit of weight during this whole timeframe.  He has not had any cardiac symptoms of angina or heart failure just notes some just lack of energy and drive.  CV Review of Symptoms (Summary) Cardiovascular ROS: no chest pain or dyspnea on exertion positive for - Decreased energy, weight gain.  Maybe a little reduced exercise tolerance negative for - edema, irregular heartbeat, orthopnea, palpitations, paroxysmal nocturnal dyspnea, rapid heart rate, shortness of breath or Syncope/near syncope or TIA/amaurosis fugax, claudication  The patient does not have symptoms concerning for COVID-19 infection (fever, chills, cough,  or new shortness of breath).  The patient is practicing social distancing & Masking.  --> Due for second COVID-19 vaccine today.  REVIEWED OF SYSTEMS   Review of Systems  Constitutional: Negative for malaise/fatigue (Just feels a little bit more lazy than usual) and weight loss (Gain).  HENT: Negative for congestion and nosebleeds.   Respiratory: Negative for shortness of breath.   Gastrointestinal: Negative for  abdominal pain, blood in stool and melena.  Genitourinary: Negative for hematuria.  Musculoskeletal: Positive for joint pain (Some hip pains). Negative for falls.  Neurological: Negative for dizziness (Only if he stands up really too fast), focal weakness, weakness and headaches.  Psychiatric/Behavioral: Negative for memory loss. The patient is not nervous/anxious and does not have insomnia.    I have reviewed and (if needed) personally updated the patient's problem list, medications, allergies, past medical and surgical history, social and family history.   PAST MEDICAL HISTORY   Past Medical History:  Diagnosis Date  . Coronary artery disease involving native heart with angina pectoris [facility]) 2005   a. 2005: CABG x5V in [place]  b. 04/2016: PCI/DES to SVG--> RCA. Severe Ost LCx (with CTO of grafted OMs - no retrograde filling) - distal Cx unprotected; RCA & LAD essentially CTO.;; 05/2018 - SVG-RCA ostial ISR with post-stent 80% - Overlapping DES & high Atm post-dilation of ISR. Progression of Native LM-ostLCx; 1/17/'20 - CSI - DES PCI dLM-ProxCx  2 overlapping DES  . Essential hypertension   . HCV (hepatitis C virus)    "treated" (05/08/2018)  . HLD (hyperlipidemia)   . Hx of CABG 2001   [place] - CABG 4: LIMA-LAD, SeqSVG-OM1-OM2,SVG-rPDA  . MI (myocardial infarction) ([place]) 2001  . Non-ST elevation MI (NSTEMI) ([place]) 04/2016   Culprit lesion was 90% SVG-RPDA -> DES PCI. Also noted 80% ostCx treated medically (subsequent redo PCI of SVG-RCA for ISR-sequential lesion 9/'29, follwed by Staged Atherecotmy-PCI of L-pCx in1/2020)  . Type 2 diabetes mellitus with complication (HCC)    CAD    PAST SURGICAL HISTORY   Past Surgical History:  Procedure Laterality Date  . CARDIAC CATHETERIZATION N/A 04/23/2016   Procedure: Left Heart Cath and Cors/Grafts Angiography;  Surgeon: Jettie Booze, MD;  Location: [place] [facility];  Service: Cardiovascular: ostLAD 75%->pLAD 100%, ostOM1 & OM2 100%,  pRCA 80% -> dRCA 50%; LIMA-LAD patent, SVG-OM1-OM2 ~20% prox otw patentt, ost-pSVG-rPDA 90% --> PCI  . CARDIAC CATHETERIZATION N/A 04/23/2016   Procedure: Coronary Stent Intervention;  Surgeon: Jettie Booze, MD;  Location: [place] [facility];  Service: Cardiovascular;  Laterality: N/A;  DES PCI - 90% pSVG- PDA (Synergy DES 3.5 x 20)  . CORONARY ARTERY BYPASS GRAFT  2001   LIMA-LAD, SeqSVG-OM1-OM2,SVG-rPDA  . CORONARY ATHERECTOMY N/A 09/18/2018   Procedure: CORONARY ATHERECTOMY (ORBITAL);  Surgeon: Leonie Man, MD;  Location: [place] [facility];  Service: Cardiovascular: dLM-OstCx CSI -OCT - PCI - OCT.   Marland Kitchen CORONARY STENT INTERVENTION N/A 05/08/2018   Procedure: CORONARY STENT INTERVENTION;  Surgeon: Leonie Man, MD;  Location: [place] [facility];  Service: Cardiovascular;; Ost SVG-RPDA-PL 70% ISR & 75-80% just beyond stent --> Synergy DES 3.5 x 12 (overlapping) with 4.0 mm post-dilation of overlap & entire old stent (especially the Ostum)  . CORONARY STENT INTERVENTION N/A 09/18/2018   Procedure: CORONARY STENT INTERVENTION;  Surgeon: Leonie Man, MD;  Location: [place] [facility];  Service: Cardiovascular: 2 overlapping DES stent placement (Osiro 3.0 x 32 mm & 3.5 x 23  mm -postdilated and tapered fashion from 4.1 to 2.8 mm)  . INTRAVASCULAR ULTRASOUND/IVUS N/A 09/18/2018   Procedure: Intravascular OCT;  Surgeon: Leonie Man, MD;  Location: [place] [facility];  Service: Cardiovascular: Post CSI Atherectomy OCT identified 2 additional downstream lesions for PCI; post PCI with overlapping DES OCT used to ensure stent apposition /expansion. -- small plaque thrombus extruding through stent @ most distal lesion -- IV Heparin overnight   . LAPAROSCOPIC CHOLECYSTECTOMY    . LEFT HEART CATH AND CORS/GRAFTS ANGIOGRAPHY N/A 05/08/2018   Procedure: LEFT HEART CATH AND CORS/GRAFTS ANGIOGRAPHY;  Surgeon: Leonie Man, MD;  Location: [place] [facility];  Service: Cardiovascular;; SVG-RPDA-PL  70% ostial ISR & 75% aftter stent; Progression of dLM-OstLCx to ~90-95%.-> PCI of SVG-RPDA-PL. RCA & LAD CTO.  Patent SeqSVG-OM1-OM2 (native OMs ostial CTO - no retrograde flow), Patent LIMA-mLAD  . NM MYOVIEW LTD  07/2018   Myoview 07/15/2018:  EF 45-50%. Small size fixed  inferolateral wall defect c/w prior MI. NO ischemia.  LOW RISK  . TRANSTHORACIC ECHOCARDIOGRAM  04/2016   EF 60-65%. GR 2 DD. Inferior hypokinesis. No significant valvular lesions   Sept 2019: Dx Cath - --> overlapping DES PCI SVG-RCA -  Procedure: CORONARY STENT INTERVENTION;  Surgeon: Leonie Man, MD;  Location: [place] [facility];  Service: Cardiovascular;; Ost SVG-RPDA-PL 70% ISR & 75-80% just beyond stent --> Synergy DES 3.5 x 12 (overlapping) with 4.0 mm post-dilation of overlap & entire old stent (especially the Ostum)    09/19/2018:PCI LM-Cx (CSI, OCT)0-- 2 overlapping DES Orsiro 3.5 x 22; 3.0 x 30    MEDICATIONS/ALLERGIES   Current Meds  Medication Sig  . amLODipine (NORVASC) 10 MG tablet TAKE ONE TABLET BY MOUTH DAILY  . BRILINTA 90 MG TABS tablet TAKE ONE TABLET BY MOUTH TWICE A DAY  . ezetimibe (ZETIA) 10 MG tablet TAKE ONE TABLET BY MOUTH DAILY  . Hypromellose (ARTIFICIAL TEARS OP) Apply 1-2 drops to eye daily as needed (dry eyes).  . metFORMIN (GLUCOPHAGE-XR) 500 MG 24 hr tablet Take 1 tablet (500 mg total) by mouth daily.  . nitroGLYCERIN (NITROSTAT) 0.4 MG SL tablet Place 1 tablet (0.4 mg total) under the tongue every 5 (five) minutes x 3 doses as needed for chest pain.  . ranolazine (RANEXA) 500 MG 12 hr tablet TAKE 1 TABLET (500 MG TOTAL) BY MOUTH TWO (TWO) TIMES DAILY.  . rosuvastatin (CRESTOR) 40 MG tablet TAKE ONE TABLET BY MOUTH DAILY  . [DISCONTINUED] isosorbide mononitrate (IMDUR) 60 MG 24 hr tablet Take 1 tablet (60 mg total) by mouth daily.  . [DISCONTINUED] losartan (COZAAR) 25 MG tablet Take 1 tablet (25 mg total) by mouth daily.  . [DISCONTINUED] metoprolol succinate (TOPROL-XL) 50 MG  24 hr tablet Take 1 tablet (50 mg total) by mouth daily.    No Known Allergies  SOCIAL HISTORY/FAMILY HISTORY   Reviewed in Epic:  Pertinent findings:  --> Second dose Covid vaccine November 17, 2019  OBJCTIVE -PE, EKG, labs   Wt Readings from Last 3 Encounters:  11/17/19 201 lb 3.2 oz (91.3 kg)  02/12/19 188 lb (85.3 kg)  10/09/18 193 lb (87.5 kg)    Physical Exam: BP 130/74   Pulse (!) 58   Temp 98.1 F (36.7 C)   Ht '6\' 1"'$  (1.854 m)   Wt 201 lb 3.2 oz (91.3 kg)   SpO2 96%   BMI 26.55 kg/m  Physical Exam  Constitutional: He is oriented to person, place, and time.  He appears well-developed and well-nourished. No distress.  Well-groomed.  Healthy-appearing.  Looks younger than stated age.  HENT:  Head: Normocephalic and atraumatic.  Neck: No hepatojugular reflux and no JVD present. Carotid bruit is not present.  Cardiovascular: Normal rate, regular rhythm, normal heart sounds and intact distal pulses.  Occasional extrasystoles are present. PMI is not displaced. Exam reveals no gallop and no friction rub.  No murmur heard. Pulmonary/Chest: Effort normal and breath sounds normal. No respiratory distress. He has no rales.  Abdominal: Soft. Bowel sounds are normal. He exhibits no distension. There is no abdominal tenderness. There is no rebound.  No HSM  Musculoskeletal:        General: No edema. Normal range of motion.     Cervical back: Normal range of motion and neck supple.  Neurological: He is oriented to person, place, and time. No cranial nerve deficit (Grossly intact).  Psychiatric: He has a normal mood and affect. His behavior is normal. Judgment and thought content normal.  Vitals reviewed.    Adult ECG Report  Rate: 56 ;  Rhythm: sinus bradycardia, sinus arrhythmia and Normal axis, intervals and durations.;   Narrative Interpretation: Relatively normal EKG  Recent Labs: Due for labs-check today (note updated. Lab Results  Component Value Date   CHOL 95 (L)  11/17/2019   HDL 49 11/17/2019   LDLCALC 32 11/17/2019   TRIG 60 11/17/2019   CHOLHDL 1.9 11/17/2019   Lab Results  Component Value Date   CREATININE 1.46 (H) 11/17/2019   BUN 14 11/17/2019   NA 139 11/17/2019   K 4.4 11/17/2019   CL 104 11/17/2019   CO2 20 11/17/2019    ASSESSMENT/PLAN    Problem List Items Addressed This Visit    Hx of CABG x 5: In 2001, in [place] (Chronic)   Coronary artery disease involving native heart with angina pectoris ([place]) - Primary (Chronic)    We have pretty much completed what revascularization can be done.  He had distal stent as disease in his SVG to RCA treated and subsequently had atherectomy and PCI from LM-LCx to perfuse the distal OM branch.  The graft to the other OM's is still patent.  He has pretty much extensive metal stent and therefore I would like to keep him on aggressive management.  He has not had any further angina since his last PCI which was revascularized circumflex distribution.  (Had a Myoview that did suggest a little ischemia in that distribution).  Would hold off on Myoview for now unless he has more symptoms.  Plan:   As intended plan was to wean off Imdur  Reduce Brilinta to maintenance dose 60 mg twice daily  Continue current current dose of amlodipine, but will increased dose of losartan in order to compensate for weaning down Toprol because of fatigue.  For now we will continue Ranexa, but that will be potentially the next option to try to wean off.      Relevant Medications   losartan (COZAAR) 50 MG tablet   metoprolol succinate (TOPROL XL) 25 MG 24 hr tablet   Other Relevant Orders   EKG 12-Lead   Coronary artery disease involving autologous vein coronary bypass graft with angina pectoris (HCC) (Chronic)    He has had redo PCI to the SVG-RCA with overlapping stents in aggressive angioplasty of the stent in the ostium.  LIMA to LAD is patent in the sequential graft to the occluded OM1 OM 2 branches are of  open.  This  should stay open because there is no antegrade flow from native vessel.  Currently angina free on current medications.  We will try to wean off Imdur but continue Ranexa for now.  Weaning down beta-blocker dose as well, but continuing amlodipine.      Relevant Medications   losartan (COZAAR) 50 MG tablet   metoprolol succinate (TOPROL XL) 25 MG 24 hr tablet   Other Relevant Orders   EKG 12-Lead   Essential hypertension (Chronic)    Blood pressures pretty well controlled on current meds, however he is having a little bit of exertional fatigue. Plan: Reduce Toprol to 25 mg daily and increase losartan to 50 mg.  Continue amlodipine to 10 mg.      Relevant Medications   losartan (COZAAR) 50 MG tablet   metoprolol succinate (TOPROL XL) 25 MG 24 hr tablet   Dyslipidemia, goal LDL below 70 (Chronic)    Labs are close to be checked before this visit, however they were not checked.  We will check them today.  He continues to be on rosuvastatin for them daily and Zetia.  --> Labs checked today show excellent control with LDL of 32. -->  I think we can probably stop the Zetia at next follow-up.  For now we will have him drop it down to 3 days a week.      Relevant Medications   losartan (COZAAR) 50 MG tablet   metoprolol succinate (TOPROL XL) 25 MG 24 hr tablet   Other Relevant Orders   Lipid panel (Completed)   Comprehensive metabolic panel (Completed)   Hemoglobin A1c (Completed)   NSTEMI (non-ST elevated myocardial infarction) ([place]) (Chronic)    He did have positive troponins back in 2017.  Myoview shows a fixed perfusion defect in the inferolateral wall which probably goes along with the SVG to RCA, but also could be related to the native circumflex. He clearly had an MI pre-CABG as his presentation was cardiac arrest.  Other than having progressive angina in 2019 leading up to his PCI in early 2020, he has not really had any further angina.  No heart failure symptoms either.   EF relatively preserved.      Relevant Medications   losartan (COZAAR) 50 MG tablet   metoprolol succinate (TOPROL XL) 25 MG 24 hr tablet   S/P angioplasty with stent distal LM and LCX. 09/18/18 (Chronic)    He has multiple stents now. Plan: Reduce Brilinta to 60 mg twice daily.  Okay to hold Brilinta 5 to 7 days preop for any procedures or surgeries.      Relevant Orders   EKG 12-Lead   Type 2 diabetes mellitus without complication, without long-term current use of insulin ([place])    Is due for an A1c.  We will check it with his routine labs.  Lab Results  Component Value Date   HGBA1C 6.4 (H) 11/17/2019         Relevant Medications   losartan (COZAAR) 50 MG tablet   Other Relevant Orders   Lipid panel (Completed)   Comprehensive metabolic panel (Completed)   Hemoglobin A1c (Completed)   Fatigue due to treatment    Not sure if his fatigue is related to medications or not, but he does sound like he has a hard time with get up and go.  We will simply just have him reduce Toprol to 25 mg and increase his losartan.      Relevant Orders   EKG 12-Lead   Lipid panel (Completed)  Comprehensive metabolic panel (Completed)   Hemoglobin A1c (Completed)       COVID-19 Education: The signs and symptoms of COVID-19 were discussed with the patient and how to seek care for testing (follow up with PCP or arrange E-visit).   The importance of social distancing was discussed today.  I spent a total of 85mnutes with the patient. >  50% of the time was spent in direct patient consultation.  Additional time spent with chart review  / charting (studies, outside notes, etc): 8 Total Time: 26 min   Current medicines are reviewed at length with the patient today.  (+/- concerns) maybe notes a little bit of fatigue   Patient Instructions / Medication Changes & Studies & Tests Ordered   Patient Instructions  Medication Instructions:  Wean down on Imdur ( isosorbide ) take 1/2 tablet  of  60 mg  For 2 weeks  If no increase chest discomfort stop medication all together.  Decrease to Toprol 25 mg one tablet a day  Increase Losartan to 50 mg  One tablet daily   *If you need a refill on your cardiac medications before your next appointment, please call your pharmacy*   Lab Work: CMP LIPID HgbA1C If you have labs (blood work) drawn today and your tests are completely normal, you will receive your results only by: .Marland KitchenMyChart Message (if you have MyChart) OR . A paper copy in the mail If you have any lab test that is abnormal or we need to change your treatment, we will call you to review the results.   Testing/Procedures: Not needed   Follow-Up: [facility] you and your health needs are our priority.  As part of our continuing mission to provide you with exceptional heart care, we have created designated Provider Care Teams.  These Care Teams include your primary Cardiologist (physician) and Advanced Practice Providers (APPs -  Physician Assistants and Nurse Practitioners) who all work together to provide you with the care you need, when you need it.    Your next appointment:   6 month(s)  The format for your next appointment:   In Person  Provider:   DGlenetta Hew MD   Other Instructions     Studies Ordered:   Orders Placed This Encounter  Procedures  . Lipid panel  . Comprehensive metabolic panel  . Hemoglobin A1c  . EKG 12-Lead     DGlenetta Hew M.D., M.S. Interventional Cardiologist   Pager # [phone] # [phone] [place] [id number]  Thank you for [facility]!

## 2019-11-17 NOTE — Patient Instructions (Addendum)
Medication Instructions:  Wean down on Imdur ( isosorbide ) take 1/2 tablet of  60 mg  For 2 weeks  If no increase chest discomfort stop medication all together.  Decrease to Toprol 25 mg one tablet a day  Increase Losartan to 50 mg  One tablet daily   *If you need a refill on your cardiac medications before your next appointment, please call your pharmacy*   Lab Work: CMP LIPID HgbA1C If you have labs (blood work) drawn today and your tests are completely normal, you will receive your results only by: Marland Kitchen MyChart Message (if you have MyChart) OR . A paper copy in the mail If you have any lab test that is abnormal or we need to change your treatment, we will call you to review the results.   Testing/Procedures: Not needed   Follow-Up: At [facility], you and your health needs are our priority.  As part of our continuing mission to provide you with exceptional heart care, we have created designated Provider Care Teams.  These Care Teams include your primary Cardiologist (physician) and Advanced Practice Providers (APPs -  Physician Assistants and Nurse Practitioners) who all work together to provide you with the care you need, when you need it.    Your next appointment:   6 month(s)  The format for your next appointment:   In Person  Provider:   [name], MD   Other Instructions

## 2019-11-18 LAB — COMPREHENSIVE METABOLIC PANEL
ALT: 58 IU/L — ABNORMAL HIGH (ref 0–44)
AST: 59 IU/L — ABNORMAL HIGH (ref 0–40)
Albumin/Globulin Ratio: 1.4 (ref 1.2–2.2)
Albumin: 4.3 g/dL (ref 3.7–4.7)
Alkaline Phosphatase: 55 IU/L (ref 39–117)
BUN/Creatinine Ratio: 10 (ref 10–24)
BUN: 14 mg/dL (ref 8–27)
Bilirubin Total: 0.5 mg/dL (ref 0.0–1.2)
CO2: 20 mmol/L (ref 20–29)
Calcium: 9.4 mg/dL (ref 8.6–10.2)
Chloride: 104 mmol/L (ref 96–106)
Creatinine, Ser: 1.46 mg/dL — ABNORMAL HIGH (ref 0.76–1.27)
GFR calc Af Amer: 55 mL/min/{1.73_m2} — ABNORMAL LOW (ref >59)
GFR calc non Af Amer: 47 mL/min/{1.73_m2} — ABNORMAL LOW (ref >59)
Globulin, Total: 3 g/dL (ref 1.5–4.5)
Glucose: 112 mg/dL — ABNORMAL HIGH (ref 65–99)
Potassium: 4.4 mmol/L (ref 3.5–5.2)
Sodium: 139 mmol/L (ref 134–144)
Total Protein: 7.3 g/dL (ref 6.0–8.5)

## 2019-11-18 LAB — LIPID PANEL
Chol/HDL Ratio: 1.9 ratio (ref 0.0–5.0)
Cholesterol, Total: 95 mg/dL — ABNORMAL LOW (ref 100–199)
HDL: 49 mg/dL (ref >39)
LDL Chol Calc (NIH): 32 mg/dL (ref 0–99)
Triglycerides: 60 mg/dL (ref 0–149)
VLDL Cholesterol Cal: 14 mg/dL (ref 5–40)

## 2019-11-18 LAB — HEMOGLOBIN A1C
Est. average glucose Bld gHb Est-mCnc: 137 mg/dL
Hgb A1c MFr Bld: 6.4 % — ABNORMAL HIGH (ref 4.8–5.6)

## 2019-11-20 ENCOUNTER — Encounter: Payer: Self-pay | Admitting: Cardiology

## 2019-11-20 NOTE — Assessment & Plan Note (Signed)
He did have positive troponins back in 2017.  Myoview shows a fixed perfusion defect in the inferolateral wall which probably goes along with the SVG to RCA, but also could be related to the native circumflex. He clearly had an MI pre-CABG as his presentation was cardiac arrest.  Other than having progressive angina in 2019 leading up to his PCI in early 2020, he has not really had any further angina.  No heart failure symptoms either.  EF relatively preserved.

## 2019-11-20 NOTE — Assessment & Plan Note (Addendum)
Is due for an A1c.  We will check it with his routine labs.  Lab Results  Component Value Date   HGBA1C 6.4 (H) 11/17/2019

## 2019-11-20 NOTE — Assessment & Plan Note (Addendum)
Labs are close to be checked before this visit, however they were not checked.  We will check them today.  He continues to be on rosuvastatin for them daily and Zetia.  --> Labs checked today show excellent control with LDL of 32. -->  I think we can probably stop the Zetia at next follow-up.  For now we will have him drop it down to 3 days a week.

## 2019-11-20 NOTE — Assessment & Plan Note (Signed)
Not sure if his fatigue is related to medications or not, but he does sound like he has a hard time with get up and go.  We will simply just have him reduce Toprol to 25 mg and increase his losartan.

## 2019-11-20 NOTE — Assessment & Plan Note (Signed)
Blood pressures pretty well controlled on current meds, however he is having a little bit of exertional fatigue. Plan: Reduce Toprol to 25 mg daily and increase losartan to 50 mg.  Continue amlodipine to 10 mg.

## 2019-11-20 NOTE — Assessment & Plan Note (Signed)
We have pretty much completed what revascularization can be done.  He had distal stent as disease in his SVG to RCA treated and subsequently had atherectomy and PCI from LM-LCx to perfuse the distal OM branch.  The graft to the other OM's is still patent.  He has pretty much extensive metal stent and therefore I would like to keep him on aggressive management.  He has not had any further angina since his last PCI which was revascularized circumflex distribution.  (Had a Myoview that did suggest a little ischemia in that distribution).  Would hold off on Myoview for now unless he has more symptoms.  Plan:   As intended plan was to wean off Imdur  Reduce Brilinta to maintenance dose 60 mg twice daily  Continue current current dose of amlodipine, but will increased dose of losartan in order to compensate for weaning down Toprol because of fatigue.  For now we will continue Ranexa, but that will be potentially the next option to try to wean off.

## 2019-11-20 NOTE — Assessment & Plan Note (Signed)
He has had redo PCI to the SVG-RCA with overlapping stents in aggressive angioplasty of the stent in the ostium.  LIMA to LAD is patent in the sequential graft to the occluded OM1 OM 2 branches are of open.  This should stay open because there is no antegrade flow from native vessel.  Currently angina free on current medications.  We will try to wean off Imdur but continue Ranexa for now.  Weaning down beta-blocker dose as well, but continuing amlodipine.

## 2019-11-20 NOTE — Assessment & Plan Note (Signed)
He has multiple stents now. Plan: Reduce Brilinta to 60 mg twice daily.  Okay to hold Brilinta 5 to 7 days preop for any procedures or surgeries.

## 2019-12-10 ENCOUNTER — Other Ambulatory Visit: Payer: Self-pay | Admitting: Cardiology

## 2019-12-10 ENCOUNTER — Other Ambulatory Visit: Payer: Self-pay | Admitting: Physician Assistant

## 2019-12-10 DIAGNOSIS — E785 Hyperlipidemia, unspecified: Secondary | ICD-10-CM

## 2020-02-26 ENCOUNTER — Other Ambulatory Visit: Payer: Self-pay | Admitting: Cardiology

## 2020-03-31 ENCOUNTER — Other Ambulatory Visit: Payer: Self-pay | Admitting: Physician Assistant

## 2020-04-26 ENCOUNTER — Other Ambulatory Visit: Payer: Self-pay | Admitting: Physician Assistant

## 2020-05-03 ENCOUNTER — Other Ambulatory Visit: Payer: Self-pay | Admitting: Cardiology

## 2020-05-30 ENCOUNTER — Ambulatory Visit (INDEPENDENT_AMBULATORY_CARE_PROVIDER_SITE_OTHER): Payer: Medicare Other | Admitting: Cardiology

## 2020-05-30 ENCOUNTER — Other Ambulatory Visit: Payer: Self-pay

## 2020-05-30 VITALS — BP 126/70 | HR 60 | Ht 73.0 in | Wt 193.4 lb

## 2020-05-30 DIAGNOSIS — E785 Hyperlipidemia, unspecified: Secondary | ICD-10-CM

## 2020-05-30 DIAGNOSIS — I25719 Atherosclerosis of autologous vein coronary artery bypass graft(s) with unspecified angina pectoris: Secondary | ICD-10-CM

## 2020-05-30 DIAGNOSIS — Z95820 Peripheral vascular angioplasty status with implants and grafts: Secondary | ICD-10-CM

## 2020-05-30 DIAGNOSIS — I25119 Atherosclerotic heart disease of native coronary artery with unspecified angina pectoris: Secondary | ICD-10-CM | POA: Diagnosis not present

## 2020-05-30 DIAGNOSIS — I10 Essential (primary) hypertension: Secondary | ICD-10-CM | POA: Diagnosis not present

## 2020-05-30 DIAGNOSIS — E119 Type 2 diabetes mellitus without complications: Secondary | ICD-10-CM | POA: Diagnosis not present

## 2020-05-30 DIAGNOSIS — R5383 Other fatigue: Secondary | ICD-10-CM | POA: Diagnosis not present

## 2020-05-30 MED ORDER — CLOPIDOGREL BISULFATE 75 MG PO TABS: 75.0000 mg | ORAL_TABLET | Freq: Every day | ORAL | 3 refills | Status: DC

## 2020-05-30 NOTE — Patient Instructions (Signed)
Medication Instructions:  Take your Brilinta twice a day, until you run out. After you run out of Brilinta, you will switch to a new medication: Plavix 75 mg once a day. *If you need a refill on your cardiac medications before your next appointment, please call your pharmacy*   Lab Work: CMP, Lipid profile in October 2021.  If you have labs (blood work) drawn today and your tests are completely normal, you will receive your results only by: Marland Kitchen MyChart Message (if you have MyChart) OR . A paper copy in the mail If you have any lab test that is abnormal or we need to change your treatment, we will call you to review the results.   Testing/Procedures: None   Follow-Up: At [facility], you and your health needs are our priority.  As part of our continuing mission to provide you with exceptional heart care, we have created designated Provider Care Teams.  These Care Teams include your primary Cardiologist (physician) and Advanced Practice Providers (APPs -  Physician Assistants and Nurse Practitioners) who all work together to provide you with the care you need, when you need it.  We recommend signing up for the patient portal called "MyChart".  Sign up information is provided on this After Visit Summary.  MyChart is used to connect with patients for Virtual Visits (Telemedicine).  Patients are able to view lab/test results, encounter notes, upcoming appointments, etc.  Non-urgent messages can be sent to your provider as well.   To learn more about what you can do with MyChart, go to [email or web address].    Your next appointment:   6 month(s)  The format for your next appointment:   In Person  Provider:   [name], MD   Other Instructions You may get your flu vaccine now, shingles vaccine and COVID-19 Booster shot (Oct/Nov).

## 2020-05-30 NOTE — Progress Notes (Signed)
Primary Care Provider: Rikki Spearing, NP Cardiologist: [name], MD Electrophysiologist: None  Clinic Note: Chief Complaint  Patient presents with  . Follow-up    50-month . Coronary Artery Disease    No angina since last PCI.    HPI:    DNASIRE REALIis a 74y.o. male with a Cardiac PMH below who presents today for 6-726-monthollow-up.   MULTIVESSEL CORONARY ARTERY DISEASEstatus post CABG 4 (LIMA-LAD {could be to D1-L, SVG-RPDA, SVG-OM1-OM 2) [facility] [place] [facility] and emergent catheter[facility]  ? -->NSTEMI IN 04/2016 ->PCI to SVG-RCA, med Rx of Native Cx disease.  ? Myoview stress test November 2018: EF 44%. Medium size moderate severity fixed defect in the basal inferolateral mid inferolateral wall consistent with prior infarct with mild peri-infarct ischemia. LOW RISK. ? Progressive Angina - 05/08/2018: Cath - ISR SVG-RCA =-> DES PCI & PTCA; non-revascularized Native Cx - ostial 90% (mild progression)-->subsequently failed medical management.  Myoview November 2019:EF 48%. Small inferior-lateral wall infarct apex to base with septal thinning at base. Thought to be consistent with prior MI but no ischemia.   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  He was doing well.  Staying active riding his bike, mowing the lawn and washing cars.  Noted no further episodes of pain chest pain or burning sensation which was his anginal equivalent.  Plan was to wean off Imdur and Ranexa unless symptoms recur.  Discontinued aspirin with plans to continue uninterrupted Brilinta until January 2021 -> plan was to reduce dose at 1 year point  DaMILT COYEas last seen  November 17, 2019.  Doing very well from a cardiac standpoint.  Just getting a little bit "lazy "due to the Covid restrictions.  Had had his first vaccine done and was planning to have a second later that day.  Riding a stationary bicycle, but not walking as much as he had been.  Recent Hospitalizations: None  Reviewed  CV studies:    The following studies were reviewed today: (if available, images/films reviewed: From Epic Chart or Care Everywhere) . None:  Interval History:   DaHANNA AULTMANresents doing well overall.  No major complaints.  He still is active as he was last visit.  Which means that he is not doing as much as he had been doing.  Frustrated about not being able to go to the gym.  He is walking still and riding a stationary bike.  Tolerating well without any major issues.  Energy level is pretty good No angina or heart failure symptoms.  CV Review of Symptoms (Summary) Cardiovascular ROS: no chest pain or dyspnea on exertion positive for - Maybe little exercise intolerance, but otherwise stable negative for - edema, irregular heartbeat, orthopnea, palpitations, paroxysmal nocturnal dyspnea, rapid heart rate, shortness of breath or Syncope/near syncope or TIA/amaurosis fugax, claudication  The patient does not have symptoms concerning for COVID-19 infection (fever, chills, cough, or new shortness of breath).  The patient is practicing social distancing & Masking.   REVIEWED OF SYSTEMS   Review of Systems  Constitutional: Negative for malaise/fatigue (Just feels a little bit more lazy than usual) and weight loss (Gain).  HENT: Negative for congestion and nosebleeds.   Respiratory: Negative for shortness of breath.   Gastrointestinal: Negative for abdominal pain, blood in stool and melena.  Genitourinary: Negative for hematuria.  Musculoskeletal: Positive for joint pain (Some hip pains). Negative for falls.  Neurological: Negative.  Dizziness: Only if he stands up really  too fast.  Psychiatric/Behavioral: Negative.    I have reviewed and (if needed) personally updated the patient's problem list, medications, allergies, past medical and surgical history, social and family history.   PAST MEDICAL HISTORY   Past Medical History:  Diagnosis Date  . Coronary artery disease involving native heart with angina pectoris [facility]) 2005   a. 2005: CABG x5V in [place]  b. 04/2016: PCI/DES to SVG--> RCA. Severe Ost LCx (with CTO of grafted OMs - no retrograde filling) - distal Cx unprotected; RCA & LAD essentially CTO.;; 05/2018 - SVG-RCA ostial ISR with post-stent 80% - Overlapping DES & high Atm post-dilation of ISR. Progression of Native LM-ostLCx; 1/17/'20 - CSI - DES PCI dLM-ProxCx  2 overlapping DES  . Essential hypertension   . HCV (hepatitis C virus)    "treated" (05/08/2018)  . HLD (hyperlipidemia)   . Hx of CABG 2001   [place] - CABG 4: LIMA-LAD, SeqSVG-OM1-OM2,SVG-rPDA  . MI (myocardial infarction) ([place]) 2001  . Non-ST elevation MI (NSTEMI) ([place]) 04/2016   Culprit lesion was 90% SVG-RPDA -> DES PCI. Also noted 80% ostCx treated medically (subsequent redo PCI of SVG-RCA for ISR-sequential lesion 9/'29, follwed by Staged Atherecotmy-PCI of L-pCx in1/2020)  . Type 2 diabetes mellitus with complication (HCC)    CAD   Immunization History  Administered Date(s) Administered  . Influenza, High Dose Seasonal PF 05/09/2018  . PFIZER SARS-COV-2 Vaccination 10/27/2019, 11/17/2019  . Pneumococcal Polysaccharide-23 05/09/2018  . Tdap 03/28/2017     PAST SURGICAL HISTORY   Past Surgical History:  Procedure Laterality Date  . CARDIAC CATHETERIZATION N/A 04/23/2016   Procedure: Left Heart Cath and Cors/Grafts Angiography;  Surgeon: Jettie Booze, MD;  Location: [place] [facility];  Service: Cardiovascular: ostLAD 75%->pLAD 100%, ostOM1 & OM2 100%, pRCA 80% -> dRCA 50%; LIMA-LAD patent, SVG-OM1-OM2 ~20% prox otw patentt, ost-pSVG-rPDA 90% --> PCI  . CARDIAC  CATHETERIZATION N/A 04/23/2016   Procedure: Coronary Stent Intervention;  Surgeon: Jettie Booze, MD;  Location: [place] [facility];  Service: Cardiovascular;  Laterality: N/A;  DES PCI - 90% pSVG- PDA (Synergy DES 3.5 x 20)  . CORONARY ARTERY BYPASS GRAFT  2001   LIMA-LAD, SeqSVG-OM1-OM2,SVG-rPDA  . CORONARY ATHERECTOMY N/A 09/18/2018   Procedure: CORONARY ATHERECTOMY (ORBITAL);  Surgeon: Leonie Man, MD;  Location: [place] [facility];  Service: Cardiovascular: dLM-OstCx CSI -OCT - PCI - OCT.   Marland Kitchen CORONARY STENT INTERVENTION N/A 05/08/2018   Procedure: CORONARY STENT INTERVENTION;  Surgeon: Leonie Man, MD;  Location: [place] [facility];  Service: Cardiovascular;; Ost SVG-RPDA-PL 70% ISR & 75-80% just beyond stent --> Synergy DES 3.5 x 12 (overlapping) with 4.0 mm post-dilation of overlap & entire old stent (especially the Ostum)  . CORONARY STENT INTERVENTION N/A 09/18/2018   Procedure: CORONARY STENT INTERVENTION;  Surgeon: Leonie Man, MD;  Location: [place] [facility];  Service: Cardiovascular: 2 overlapping DES stent placement (Osiro 3.0 x 32 mm & 3.5 x 23 mm -postdilated and tapered fashion from 4.1 to 2.8 mm)  . INTRAVASCULAR ULTRASOUND/IVUS N/A 09/18/2018   Procedure: Intravascular OCT;  Surgeon: Leonie Man, MD;  Location: [place] [facility];  Service:  Cardiovascular: Post CSI Atherectomy OCT identified 2 additional downstream lesions for PCI; post PCI with overlapping DES OCT used to ensure stent apposition /expansion. -- small plaque thrombus extruding through stent @ most distal lesion -- IV Heparin overnight   . LAPAROSCOPIC CHOLECYSTECTOMY    . LEFT HEART CATH AND CORS/GRAFTS ANGIOGRAPHY N/A 05/08/2018   Procedure: LEFT HEART CATH AND CORS/GRAFTS ANGIOGRAPHY;  Surgeon: Leonie Man, MD;  Location: [place] [facility];  Service: Cardiovascular;; SVG-RPDA-PL 70% ostial ISR & 75% aftter stent; Progression of dLM-OstLCx to ~90-95%.-> PCI of SVG-RPDA-PL. RCA & LAD CTO.   Patent SeqSVG-OM1-OM2 (native OMs ostial CTO - no retrograde flow), Patent LIMA-mLAD  . NM MYOVIEW LTD  07/2018   Myoview 07/15/2018:  EF 45-50%. Small size fixed  inferolateral wall defect c/w prior MI. NO ischemia.  LOW RISK  . TRANSTHORACIC ECHOCARDIOGRAM  04/2016   EF 60-65%. GR 2 DD. Inferior hypokinesis. No significant valvular lesions   Sept 2019: Dx Cath - --> overlapping DES PCI SVG-RCA -  Procedure: CORONARY STENT INTERVENTION;  Surgeon: Leonie Man, MD;  Location: [place] [facility];  Service: Cardiovascular;; Ost SVG-RPDA-PL 70% ISR & 75-80% just beyond stent --> Synergy DES 3.5 x 12 (overlapping) with 4.0 mm post-dilation of overlap & entire old stent (especially the Ostum)   09/19/2018:PCI LM-Cx (CSI, OCT)0-- 2 overlapping DES Orsiro 3.5 x 22; 3.0 x 30    MEDICATIONS/ALLERGIES   Current Meds  Medication Sig  . amLODipine (NORVASC) 10 MG tablet TAKE ONE TABLET BY MOUTH DAILY  . BRILINTA 90 MG TABS tablet TAKE ONE TABLET BY MOUTH TWICE A DAY  . ezetimibe (ZETIA) 10 MG tablet TAKE ONE TABLET BY MOUTH DAILY  . Hypromellose (ARTIFICIAL TEARS OP) Apply 1-2 drops to eye daily as needed (dry eyes).  . metFORMIN (GLUCOPHAGE-XR) 500 MG 24 hr tablet Take 1 tablet (500 mg total) by mouth daily.  . metoprolol succinate (TOPROL XL) 25 MG 24 hr tablet Take 1 tablet (25 mg total) by mouth daily.  . nitroGLYCERIN (NITROSTAT) 0.4 MG SL tablet Place 1 tablet (0.4 mg total) under the tongue every 5 (five) minutes x 3 doses as needed for chest pain.  . ranolazine (RANEXA) 500 MG 12 hr tablet TAKE ONE TABLET BY MOUTH TWICE A DAY  . rosuvastatin (CRESTOR) 40 MG tablet TAKE ONE TABLET BY MOUTH DAILY    No Known Allergies  SOCIAL HISTORY/FAMILY HISTORY   Reviewed in Epic:  Pertinent findings: Nothing new  OBJCTIVE -PE, EKG, labs   Wt Readings from Last 3 Encounters:  05/30/20 193 lb 6.4 oz (87.7 kg)  11/17/19 201 lb 3.2 oz (91.3 kg)  02/12/19 188 lb (85.3 kg)    Physical  Exam: BP 126/70   Pulse 60   Ht _0  (1.854 m)   Wt 193 lb 6.4 oz (87.7 kg)   SpO2 99%   BMI 25.52 kg/m  Physical Exam Vitals reviewed.  Constitutional:      General: He is not in acute distress.    Appearance: Normal appearance. He is well-developed and normal weight. He is not ill-appearing or toxic-appearing.     Comments: Well-groomed.  Healthy-appearing.  Looks younger than stated age.  HENT:     Head: Normocephalic and atraumatic.  Neck:     Vascular: No carotid bruit, hepatojugular reflux or JVD.  Cardiovascular:     Rate and Rhythm: Normal rate and regular rhythm. Occasional extrasystoles are present.    Chest Wall: PMI is not displaced.  Pulses: Intact distal pulses.     Heart sounds: Normal heart sounds. No murmur heard.  No friction rub. No gallop.   Pulmonary:     Effort: Pulmonary effort is normal. No respiratory distress.     Breath sounds: Normal breath sounds. No rales.  Abdominal:     General: Abdomen is flat. Bowel sounds are normal. There is no distension.     Palpations: Abdomen is soft. There is no mass.     Tenderness: There is no abdominal tenderness. There is no rebound.     Comments: No HSM  Musculoskeletal:        General: Normal range of motion.     Cervical back: Normal range of motion and neck supple.  Neurological:     General: No focal deficit present.     Mental Status: He is alert and oriented to person, place, and time.     Cranial Nerves: No cranial nerve deficit (Grossly intact).  Psychiatric:        Behavior: Behavior normal.        Thought Content: Thought content normal.        Judgment: Judgment normal.     Adult ECG Report  Rate: 60 ;  Rhythm: normal sinus rhythm and Normal axis, intervals and durations.;   Narrative Interpretation: Relatively normal EKG  Recent Labs: Due for labs-check today (note updated.  Lab Results  Component Value Date   CHOL 95 (L) 11/17/2019   HDL 49 11/17/2019   LDLCALC 32 11/17/2019   TRIG  60 11/17/2019   CHOLHDL 1.9 11/17/2019   Lab Results  Component Value Date   CREATININE 1.46 (H) 11/17/2019   BUN 14 11/17/2019   NA 139 11/17/2019   K 4.4 11/17/2019   CL 104 11/17/2019   CO2 20 11/17/2019   Lab Results  Component Value Date   HGBA1C 6.4 (H) 11/17/2019     ASSESSMENT/PLAN    Problem List Items Addressed This Visit    Coronary artery disease involving native heart with angina pectoris ([place]) - Primary (Chronic)    Status post complete revascularization of previously non-native distal circumflex (with left main into LCx PCI) and RCA graft DES PCI.  Plan:  Continue amlodipine, Ranexa and Toprol for antianginal benefit.  Now on maintenance dose Brilinta 60 mg twice daily -> will change to Plavix upon completion of next prescription for long-term maintenance.  Okay to hold Plavix (or Brilinta) 5-7 days preop for any surgeries or procedures.  Continue current dose of ARB  Continue current dose of atorvastatin      Relevant Medications   clopidogrel (PLAVIX) 75 MG tablet   Other Relevant Orders   EKG 12-Lead (Completed)   Coronary artery disease involving autologous vein coronary bypass graft with angina pectoris (HCC) (Chronic)    Status post PCI to SVG-RCA with overlapping stents.  Patent LIMA to LAD and Sequential SVG-OM1-OM 2.  We did PCI of the native LCx because both OM1 and OM 2 are occluded in the ostium.  Angina free on current medications.  No change.      Essential hypertension (Chronic)    Excellent blood pressure control.  Energy level is doing better with reduced level of Toprol and increase losartan. Stable dose of amlodipine.      Diabetes mellitus type II, non insulin dependent (HCC) (Chronic)    Last A1c was 6.4.  Defer treatment to PCP, however would likely consider Metformin      Dyslipidemia, goal LDL below 70 (  Chronic)    Due for labs to be checked.  Unfortunately, he has eaten today, therefore we will order for sometime this  next month.  Most recent liver panel showed excellent control on current dose of atorvastatin.  I think we can probably stop that he have his labs still look as good as last check.      Relevant Orders   Comprehensive Metabolic Panel (CMET)   Lipid Profile   S/P angioplasty with stent distal LM and LCX. 09/18/18 (Chronic)    He is now over a year and a half out from his last PCI, and 2 years from the PCI to the vein graft.  We are converting from Brilinta at the next prescription to Plavix.  The plan had been to reduce him to 60 mg twice daily, but that was not available.  He is far enough out were okay to switch to Plavix.  Okay to hold Plavix (or Brilinta if still on it) 5-7 days preop for any procedures or surgeries.       Fatigue due to treatment    Energy level did improve with reducing his beta-blocker and increasing ARB.          COVID-19 Education: The signs and symptoms of COVID-19 were discussed with the patient and how to seek care for testing (follow up with PCP or arrange E-visit).   The importance of social distancing was discussed today.  I spent a total of 70mnutes with the patient. >  50% of the time was spent in direct patient consultation.  Additional time spent with chart review  / charting (studies, outside notes, etc): 8 Total Time: 26 min   Current medicines are reviewed at length with the patient today.  (+/- concerns) maybe notes a little bit of fatigue   Patient Instructions / Medication Changes & Studies & Tests Ordered   Patient Instructions  Medication Instructions:  Take your Brilinta twice a day, until you run out. After you run out of Brilinta, you will switch to a new medication: Plavix 75 mg once a day. *If you need a refill on your cardiac medications before your next appointment, please call your pharmacy*   Lab Work: CMP, Lipid profile in October 2021.  If you have labs (blood work) drawn today and your tests are completely normal,  you will receive your results only by: .Marland KitchenMyChart Message (if you have MyChart) OR . A paper copy in the mail If you have any lab test that is abnormal or we need to change your treatment, we will call you to review the results.   Testing/Procedures: None   Follow-Up: [facility] and your [facility] needs are our priority.  As part of our continuing mission to provide you with exceptional heart care, we have created designated Provider Care Teams.  These Care Teams include your primary Cardiologist (physician) and Advanced Practice Providers (APPs -  Physician Assistants and Nurse Practitioners) who all work together to provide you with the care you need, when you need it.  We recommend signing up for the patient portal called "MyChart".  Sign up information is provided on this After Visit Summary.  MyChart is used to connect with patients for Virtual Visits (Telemedicine).  Patients are able to view lab/test results, encounter notes, upcoming appointments, etc.  Non-urgent messages can be sent to your provider as well.   To learn more about what you can do with MyChart, go to [email or web address]    Your next  appointment:   6 month(s)  The format for your next appointment:   In Person  Provider:   [name], MD   Other Instructions You may get your flu vaccine now, shingles vaccine and COVID-19 Booster shot (Oct/Nov).    Studies Ordered:   Orders Placed This Encounter  Procedures  . Comprehensive Metabolic Panel (CMET)  . Lipid Profile  . EKG 12-Lead     [name], M.D., M.S. Interventional Cardiologist   Pager # [phone] Phone # [phone] [address] [place], [place] [address]   Thank you for choosing Heartcare at [facility]!!

## 2020-06-07 DIAGNOSIS — Z23 Encounter for immunization: Secondary | ICD-10-CM | POA: Diagnosis not present

## 2020-06-09 ENCOUNTER — Encounter: Payer: Self-pay | Admitting: Cardiology

## 2020-06-09 NOTE — Assessment & Plan Note (Signed)
Excellent blood pressure control.  Energy level is doing better with reduced level of Toprol and increase losartan. Stable dose of amlodipine.

## 2020-06-09 NOTE — Assessment & Plan Note (Signed)
Status post PCI to SVG-RCA with overlapping stents.  Patent LIMA to LAD and Sequential SVG-OM1-OM 2.  We did PCI of the native LCx because both OM1 and OM 2 are occluded in the ostium.  Angina free on current medications.  No change.

## 2020-06-09 NOTE — Assessment & Plan Note (Signed)
Status post complete revascularization of previously non-native distal circumflex (with left main into LCx PCI) and RCA graft DES PCI.  Plan:  Continue amlodipine, Ranexa and Toprol for antianginal benefit.  Now on maintenance dose Brilinta 60 mg twice daily -> will change to Plavix upon completion of next prescription for long-term maintenance.  Okay to hold Plavix (or Brilinta) 5-7 days preop for any surgeries or procedures.  Continue current dose of ARB  Continue current dose of atorvastatin

## 2020-06-09 NOTE — Assessment & Plan Note (Signed)
Energy level did improve with reducing his beta-blocker and increasing ARB.

## 2020-06-09 NOTE — Assessment & Plan Note (Signed)
Last A1c was 6.4.  Defer treatment to PCP, however would likely consider Metformin

## 2020-06-09 NOTE — Assessment & Plan Note (Addendum)
He is now over a year and a half out from his last PCI, and 2 years from the PCI to the vein graft.  We are converting from Brilinta at the next prescription to Plavix.  The plan had been to reduce him to 60 mg twice daily, but that was not available.  He is far enough out were okay to switch to Plavix.  Okay to hold Plavix (or Brilinta if still on it) 5-7 days preop for any procedures or surgeries.

## 2020-06-09 NOTE — Assessment & Plan Note (Addendum)
Due for labs to be checked.  Unfortunately, he has eaten today, therefore we will order for sometime this next month.  Most recent liver panel showed excellent control on current dose of atorvastatin.  I think we can probably stop that he have his labs still look as good as last check.

## 2020-06-27 DIAGNOSIS — E785 Hyperlipidemia, unspecified: Secondary | ICD-10-CM | POA: Diagnosis not present

## 2020-06-27 LAB — LIPID PANEL
Chol/HDL Ratio: 2.1 ratio (ref 0.0–5.0)
Cholesterol, Total: 112 mg/dL (ref 100–199)
HDL: 53 mg/dL (ref >39)
LDL Chol Calc (NIH): 47 mg/dL (ref 0–99)
Triglycerides: 53 mg/dL (ref 0–149)
VLDL Cholesterol Cal: 12 mg/dL (ref 5–40)

## 2020-06-27 LAB — COMPREHENSIVE METABOLIC PANEL
ALT: 48 IU/L — ABNORMAL HIGH (ref 0–44)
AST: 50 IU/L — ABNORMAL HIGH (ref 0–40)
Albumin/Globulin Ratio: 1.3 (ref 1.2–2.2)
Albumin: 4.2 g/dL (ref 3.7–4.7)
Alkaline Phosphatase: 60 IU/L (ref 44–121)
BUN/Creatinine Ratio: 11 (ref 10–24)
BUN: 15 mg/dL (ref 8–27)
Bilirubin Total: 0.4 mg/dL (ref 0.0–1.2)
CO2: 22 mmol/L (ref 20–29)
Calcium: 9.3 mg/dL (ref 8.6–10.2)
Chloride: 106 mmol/L (ref 96–106)
Creatinine, Ser: 1.42 mg/dL — ABNORMAL HIGH (ref 0.76–1.27)
GFR calc Af Amer: 56 mL/min/{1.73_m2} — ABNORMAL LOW (ref >59)
GFR calc non Af Amer: 49 mL/min/{1.73_m2} — ABNORMAL LOW (ref >59)
Globulin, Total: 3.2 g/dL (ref 1.5–4.5)
Glucose: 95 mg/dL (ref 65–99)
Potassium: 4.7 mmol/L (ref 3.5–5.2)
Sodium: 140 mmol/L (ref 134–144)
Total Protein: 7.4 g/dL (ref 6.0–8.5)

## 2020-07-22 DIAGNOSIS — Z23 Encounter for immunization: Secondary | ICD-10-CM | POA: Diagnosis not present

## 2020-09-06 ENCOUNTER — Other Ambulatory Visit: Payer: Self-pay | Admitting: Cardiology

## 2020-10-05 ENCOUNTER — Other Ambulatory Visit: Payer: Self-pay | Admitting: Physician Assistant

## 2020-10-05 DIAGNOSIS — E785 Hyperlipidemia, unspecified: Secondary | ICD-10-CM

## 2020-10-27 ENCOUNTER — Ambulatory Visit (INDEPENDENT_AMBULATORY_CARE_PROVIDER_SITE_OTHER): Payer: Medicare Other | Admitting: Cardiology

## 2020-10-27 ENCOUNTER — Other Ambulatory Visit: Payer: Self-pay

## 2020-10-27 ENCOUNTER — Encounter: Payer: Self-pay | Admitting: Cardiology

## 2020-10-27 VITALS — BP 130/68 | HR 60 | Ht 73.0 in | Wt 205.5 lb

## 2020-10-27 DIAGNOSIS — Z951 Presence of aortocoronary bypass graft: Secondary | ICD-10-CM | POA: Diagnosis not present

## 2020-10-27 DIAGNOSIS — I10 Essential (primary) hypertension: Secondary | ICD-10-CM

## 2020-10-27 DIAGNOSIS — Z024 Encounter for examination for driving license: Secondary | ICD-10-CM

## 2020-10-27 DIAGNOSIS — I25119 Atherosclerotic heart disease of native coronary artery with unspecified angina pectoris: Secondary | ICD-10-CM | POA: Diagnosis not present

## 2020-10-27 DIAGNOSIS — R5383 Other fatigue: Secondary | ICD-10-CM

## 2020-10-27 DIAGNOSIS — E119 Type 2 diabetes mellitus without complications: Secondary | ICD-10-CM

## 2020-10-27 DIAGNOSIS — E785 Hyperlipidemia, unspecified: Secondary | ICD-10-CM | POA: Diagnosis not present

## 2020-10-27 DIAGNOSIS — I214 Non-ST elevation (NSTEMI) myocardial infarction: Secondary | ICD-10-CM | POA: Diagnosis not present

## 2020-10-27 DIAGNOSIS — I25719 Atherosclerosis of autologous vein coronary artery bypass graft(s) with unspecified angina pectoris: Secondary | ICD-10-CM

## 2020-10-27 DIAGNOSIS — Z95820 Peripheral vascular angioplasty status with implants and grafts: Secondary | ICD-10-CM

## 2020-10-27 NOTE — Patient Instructions (Addendum)
Medication Instructions:  No changes  *If you need a refill on your cardiac medications before your next appointment, please call your pharmacy*   Lab Work: Not needed    Testing/Procedures: You will need a COVID-19  test  3 days prior to your procedure Myoview. This is a Drive Up Visit at [address] [place], [place] [address]. Someone will direct you to the appropriate testing line. Stay in your car and someone will be with you shortly. Your physician has requested that you have a lexiscan myoview.  Please follow instruction sheet, as given.  Shared Decision Making/Informed Consent{ All outpatient stress tests require an informed consent - ATTESTATION ORDER - ordered The risks [chest pain, shortness of breath, cardiac arrhythmias, dizziness, blood pressure fluctuations, myocardial infarction, stroke/transient ischemic attack, nausea, vomiting, allergic reaction, radiation exposure, metallic taste sensation and life-threatening complications (estimated to be 1 in 10,000)], benefits (risk stratification, diagnosing coronary artery disease, treatment guidance) and alternatives of a nuclear stress test were discussed in detail with Mr. Rothbauer and he agrees to proceed.  Follow-Up: At [facility], you and your health needs are our priority.  As part of our continuing mission to provide you with exceptional heart care, we have created designated Provider Care Teams.  These Care Teams include your primary Cardiologist (physician) and Advanced Practice Providers (APPs -  Physician Assistants and Nurse Practitioners) who all work together to provide you with the care you need, when you need it.     Your next appointment:   6 month(s)  The format for your next appointment:   In Person  Provider:   [name], MD

## 2020-10-27 NOTE — Progress Notes (Signed)
Primary Care Provider: Rikki Spearing, NP Cardiologist: [name], MD Electrophysiologist: None  Clinic Note: Chief Complaint  Patient presents with  . DOT Physical    Needs stress test for DOT physical  . Follow-up    No major complaints  . Coronary Artery Disease    No CHF or angina.  Needs stress test -> but cannot walk on a treadmill because of hip pain.    ===================================  ASSESSMENT/PLAN   Problem List Items Addressed This Visit    Encounter for Department of Transportation (DOT) examination for driving license renewal - Primary    Last time we checked a Myoview, he had significant symptoms in the right staged PCI to the LM-LCx. He has not had an ischemic evaluation since that PCI.   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  Technically, we could try a treadmill stress test, however with his hip pain, he does not think he could take the ramping up of speed and incline required on treadmill.  He is fearful of falling.  Plan: Lexiscan Myoview -> informed consent noted in patient instructions. => Based on his previous evaluation, he had a large fixed defect along with ischemia.  Our intention is to evaluate for ischemia.  We may need to check an echocardiogram if the EF is not adequately assessed.      Relevant Orders   EKG 12-Lead (Completed)   MYOCARDIAL PERFUSION IMAGING   Fatigue due to treatment    He did notice improved energy with reduced dose of beta-blocker.  In response, we increased losartan for blood pressure control.      Hx of CABG x 5: In 2001, in [place] (Chronic)    History of CABG x4.  Grafts are patent with overlapping stents in the SVG-RCA.  The native LCx is revascularized via LM-LCx stents to perfuse the distal LCx and OM 3 (both OM1 and OM2 are grafted, but are occluded proximally with no retrograde flow into the native LCx.)  Truthfully,  ischemic evaluation should be with a Myoview stress test, however we may not get an accurate EF assessment.  Low threshold to order echo in conjunction of Myoview.      Relevant Orders   EKG 12-Lead (Completed)   MYOCARDIAL PERFUSION IMAGING   Cardiac Stress Test: Informed Consent Details: Physician/Practitioner Attestation; Transcribe to consent form and obtain patient signature   Coronary artery disease involving native heart with angina pectoris ([place]) (Chronic)    He was having angina, underwent SVG FFR-followed by staged atherectomy PCI of left main into LCx.  Really has not had any angina since that procedure.  Unfortunately is not quite as active as he once was.  He had pretty classic burning chest tightness symptoms as his angina, and not noticing them with good level of activity he is able to do.  Unfortunately, he is not able to walk on a treadmill because of back pain.  He needs a stress test evaluation for DOT physical evaluation.  This will require Lexiscan Myoview.  Plan:  Lexiscan Myoview Stress Test for DOT physical ->  Informed consent discussed in patient instructions  Otherwise, continue amlodipine, Toprol and Ranexa.  On lifelong clopidogrel (okay to hold for procedures or surgeries 5 days preop)  On statin plus Zetia.  Continue current dose of losartan.      Relevant Orders   EKG 12-Lead (Completed)   MYOCARDIAL PERFUSION IMAGING  Cardiac Stress Test: Informed Consent Details: Physician/Practitioner Attestation; Transcribe to consent form and obtain patient signature   Coronary artery disease involving autologous vein coronary bypass graft with angina pectoris ([place]) (Chronic)    He had PCI to SVG RCA.   Patent LIMA to LAD. Patent SVG-OM1 and OM 2, with no backflow to the native LCx he underwent PCI to native LM-LCx.   No further angina now on amlodipine, Toprol and Ranexa.      Relevant Orders   EKG 12-Lead (Completed)   MYOCARDIAL PERFUSION IMAGING    Cardiac Stress Test: Informed Consent Details: Physician/Practitioner Attestation; Transcribe to consent form and obtain patient signature   Essential hypertension (Chronic)    Blood pressure well controlled on reduced dose of Toprol and increased dose of losartan.      Diabetes mellitus type II, non insulin dependent (HCC) (Chronic)   Relevant Orders   MYOCARDIAL PERFUSION IMAGING   Dyslipidemia, goal LDL below 70 (Chronic)    Labs from October 2021 reviewed.  Excellent lipids.  Continue Zetia +40 mg rosuvastatin.      NSTEMI (non-ST elevated myocardial infarction) ([place]) (Chronic)    When he had PCI to the SVG-RCA back in 2017 he did have elevated troponins.  His anterior wall perfusion defect on Myoview at that time.  No further angina since the staged LM-LCx PCI in January 2020.       Relevant Orders   EKG 12-Lead (Completed)   MYOCARDIAL PERFUSION IMAGING   Cardiac Stress Test: Informed Consent Details: Physician/Practitioner Attestation; Transcribe to consent form and obtain patient signature   S/P angioplasty with stent distal LM and LCX. 09/18/18 (Chronic)    2 years out from his most recent PCI now with no really recurrent anginal symptoms.  However with extensive stents to both the RCA graft and the native LM-LCx, he will stay on lifelong Plavix.   Okay to hold Plavix 5-7 days preop for surgeries or procedures.    Does not need reloading when reinitiated.      Relevant Orders   Cardiac Stress Test: Informed Consent Details: Physician/Practitioner Attestation; Transcribe to consent form and obtain patient signature      ===================================  HPI:    DMARIO RUSSOM is a 75 y.o. male with a PMH below who presents today for 38-monthfollow-up of CAD.  Doing well, but needs Stress Test for DOT Physical Evaluation.   MULTIVESSEL CORONARY ARTERY DISEASEstatus post CABG 4 (LIMA-LAD {could be to D1-L, SVG-RPDA, SVG-OM1-OM 2) [facility] with [facility] and emergent catheterization  ? -->NSTEMI IN 04/2016 ->PCI to SVG-RCA, med Rx of Native Cx disease.  ? Myoview stress test November 2018: EF 44%. Medium size moderate severity fixed defect in the basal inferolateral mid inferolateral wall consistent with prior infarct with mild peri-infarct ischemia. LOW RISK. ? Progressive Angina - 05/08/2018: Cath - ISR SVG-RCA =-> DES PCI & PTCA; non-revascularized Native Cx - ostial 90% (mild progression)-->subsequently failed medical management.  Myoview November 2019:EF 48%. Small inferior-lateral wall infarct apex to base with septal thinning at base. Thought to be consistent with prior MI but no ischemia.   (On my review, this appeared to have clear evidence of at least mild ischemia)  RECURRENT UNSTABLE ANGINA January 2020: Atherectomy based/OCT guided PCI LM-LCx: 2 overlapping DES stent placement (Osiro 3.0 x 32 mm & 3.5 x 23 mm -postdilated and tapered fashion from 4.1 to 2.8 mm)  DBecky Saxwas last seen on 05/30/2020 -> he was  doing well.  No major complaints.  Still active, but not doing as much because of joint pains.  Doing some walking and riding stationary bicycle.  No angina or heart failure symptoms.  Just a little exercise to tolerance from deconditioning.  Recent Hospitalizations: None  Reviewed  CV studies:    The following studies were reviewed today: (if available, images/films reviewed: From Epic Chart or Care Everywhere) . None:   Interval History:   JARRELL ARMOND presents today for a little earlier than expected 36-monthfollow-up.  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  He therefore needs to get his DOT physical renewed and they are requesting stress test.  He is fearful that he probably could not walk on treadmill because of his hip pain.  He is still pretty active, and tries to walk some, usually uses a stationary bike.  Frustrated because he not going to the  gym to use machines.  He likes elliptical machine.  His energy level has improved, he believes just gotten more lazy and gained weight because of his lack of exercise.  Remains asymptomatic from chronic standpoint.  Does exercise intolerance because of deconditioning and hip/back pain.  CV Review of Symptoms (Summary): no chest pain or dyspnea on exertion positive for - Exercise intolerance related to deconditioning.   negative for - edema, irregular heartbeat, orthopnea, palpitations, paroxysmal nocturnal dyspnea, rapid heart rate, shortness of breath or Lightheadedness or dizziness or syncope/near syncope, TIA/amaurosis fugax, claudication  The patient does not have symptoms concerning for COVID-19 infection (fever, chills, cough, or new shortness of breath).   REVIEWED OF SYSTEMS   Review of Systems  Constitutional: Negative for malaise/fatigue (Not fatigue, just "lazy ") and weight loss (MildlyHe actually gained weight.  Has been little more lazy than usual.  Hip pain and back pain is limiting his activity.).  HENT: Negative for congestion and nosebleeds.   Respiratory: Negative for cough and shortness of breath.   Gastrointestinal: Negative for abdominal pain, blood in stool and melena.  Genitourinary: Negative for hematuria.  Musculoskeletal: Positive for back pain and joint pain (Both hips).  Neurological: Negative for dizziness, tingling and focal weakness.  Psychiatric/Behavioral: Negative.    I have reviewed and (if needed) personally updated the patient's problem list, medications, allergies, past medical and surgical history, social and family history.   PAST MEDICAL HISTORY   Past Medical History:  Diagnosis Date  . Coronary artery disease involving native heart with angina pectoris ([facility] 2005   a. [place]: PCI/DES to SVG--> RCA. Severe Ost LCx (with CTO of grafted OMs - no retrograde filling) - distal Cx unprotected; RCA & LAD essentially  CTO.;; 05/2018 - SVG-RCA ostial ISR with post-stent 80% - Overlapping DES & high Atm post-dilation of ISR. Progression of Native LM-ostLCx; 1/17/'20 - CSI - DES PCI dLM-ProxCx  2 overlapping DES  . Essential hypertension   . HCV (hepatitis C virus)    "treated" (05/08/2018)  . HLD (hyperlipidemia)   . Hx of CABG 2001   [place] - CABG 4: LIMA-LAD, SeqSVG-OM1-OM2,SVG-rPDA  . [place]  . [place] elevation [place]   Culprit lesion was 90% SVG-RPDA -> DES PCI. Also noted 80% ostCx treated medically (subsequent redo PCI of SVG-RCA for ISR-sequential lesion 9/'29, follwed by Staged Atherecotmy-PCI of L-pCx in1/2020)  . Type 2 diabetes mellitus with complication (HCC)    CAD    PAST SURGICAL  HISTORY   Past Surgical History:  Procedure Laterality Date  . CARDIAC CATHETERIZATION N/A 04/23/2016   Procedure: Left Heart Cath and Cors/Grafts Angiography;  Surgeon: Jettie Booze, MD;  Location: [place] [facility];  Service: Cardiovascular: ostLAD 75%->pLAD 100%, ostOM1 & OM2 100%, pRCA 80% -> dRCA 50%; LIMA-LAD patent, SVG-OM1-OM2 ~20% prox otw patentt, ost-pSVG-rPDA 90% --> PCI  . CARDIAC CATHETERIZATION N/A 04/23/2016   Procedure: Coronary Stent Intervention;  Surgeon: Jettie Booze, MD;  Location: [place] [facility];  Service: Cardiovascular;  Laterality: N/A;  DES PCI - 90% pSVG- PDA (Synergy DES 3.5 x 20)  . CORONARY ARTERY BYPASS GRAFT  2001   LIMA-LAD, SeqSVG-OM1-OM2,SVG-rPDA  . CORONARY ATHERECTOMY N/A 09/18/2018   Procedure: CORONARY ATHERECTOMY (ORBITAL);  Surgeon: Leonie Man, MD;  Location: [place] [facility];  Service: Cardiovascular: dLM-OstCx CSI -OCT - PCI - OCT.   Marland Kitchen CORONARY STENT INTERVENTION N/A 05/08/2018   Procedure: CORONARY STENT INTERVENTION;  Surgeon: Leonie Man, MD;  Location: [place] [facility];  Service: Cardiovascular;; Ost SVG-RPDA-PL 70% ISR & 75-80% just beyond stent --> Synergy DES 3.5 x 12 (overlapping) with  4.0 mm post-dilation of overlap & entire old stent (especially the Ostum)  . CORONARY STENT INTERVENTION N/A 09/18/2018   Procedure: CORONARY STENT INTERVENTION;  Surgeon: Leonie Man, MD;  Location: [place] [facility];  Service: Cardiovascular: 2 overlapping DES stent placement (Osiro 3.0 x 32 mm & 3.5 x 23 mm -postdilated and tapered fashion from 4.1 to 2.8 mm)  . INTRAVASCULAR ULTRASOUND/IVUS N/A 09/18/2018   Procedure: Intravascular OCT;  Surgeon: Leonie Man, MD;  Location: [place] [facility];  Service: Cardiovascular: Post CSI Atherectomy OCT identified 2 additional downstream lesions for PCI; post PCI with overlapping DES OCT used to ensure stent apposition /expansion. -- small plaque thrombus extruding through stent @ most distal lesion -- IV Heparin overnight   . LAPAROSCOPIC CHOLECYSTECTOMY    . LEFT HEART CATH AND CORS/GRAFTS ANGIOGRAPHY N/A 05/08/2018   Procedure: LEFT HEART CATH AND CORS/GRAFTS ANGIOGRAPHY;  Surgeon: Leonie Man, MD;  Location: [place] [facility];  Service: Cardiovascular;; SVG-RPDA-PL 70% ostial ISR & 75% aftter stent; Progression of dLM-OstLCx to ~90-95%.-> PCI of SVG-RPDA-PL. RCA & LAD CTO.  Patent SeqSVG-OM1-OM2 (native OMs ostial CTO - no retrograde flow), Patent LIMA-mLAD  . NM MYOVIEW LTD  07/2018   Myoview 07/15/2018:  EF 45-50%. Small size fixed  inferolateral wall defect c/w prior MI. NO ischemia.  LOW RISK  . TRANSTHORACIC ECHOCARDIOGRAM  04/2016   EF 60-65%. GR 2 DD. Inferior hypokinesis. No significant valvular lesions    05/08/2018: Ost SVG-RPDA-PL 70% ISR & 75-80% just beyond stent --> Synergy DES 3.5 x 12 (overlapping previous stent) => 4.0 mm post-dilation of overlap & entire old stent (esp. Ostum)      09/19/2018:PCI LM-Cx (CSI, OCT)--  Mid LM 95%(involving LAD - that is 100% CTO), Ost Lcx 90%, prox-mid 80% & 75% => 2 overlapping Orsiro DES 3.5 x 23; 3.0 x 30 (post-dilated 4.0 - 3.8 - 3.3 mm)  Immunization History  Administered Date(s)  Administered  . Influenza, High Dose Seasonal PF 05/09/2018  . PFIZER(Purple Top)SARS-COV-2 Vaccination 10/27/2019, 11/17/2019  . Pneumococcal Polysaccharide-23 05/09/2018  . Tdap 03/28/2017    MEDICATIONS/ALLERGIES   Current Meds  Medication Sig  . amLODipine (NORVASC) 10 MG tablet TAKE ONE TABLET BY MOUTH DAILY  . clopidogrel (PLAVIX) 75 MG tablet Take 1 tablet (75 mg total) by mouth daily.  Marland Kitchen  ezetimibe (ZETIA) 10 MG tablet TAKE ONE TABLET BY MOUTH DAILY  . Hypromellose (ARTIFICIAL TEARS OP) Apply 1-2 drops to eye daily as needed (dry eyes).  . metFORMIN (GLUCOPHAGE-XR) 500 MG 24 hr tablet Take 1 tablet (500 mg total) by mouth daily.  . metoprolol succinate (TOPROL-XL) 25 MG 24 hr tablet TAKE ONE TABLET BY MOUTH DAILY  . nitroGLYCERIN (NITROSTAT) 0.4 MG SL tablet Place 1 tablet (0.4 mg total) under the tongue every 5 (five) minutes x 3 doses as needed for chest pain.  . ranolazine (RANEXA) 500 MG 12 hr tablet TAKE ONE TABLET BY MOUTH TWICE A DAY  . rosuvastatin (CRESTOR) 40 MG tablet TAKE ONE TABLET BY MOUTH DAILY    No Known Allergies  SOCIAL HISTORY/FAMILY HISTORY   Reviewed in Epic:  Pertinent findings:  Social History   Tobacco Use  . Smoking status: Former Smoker    Packs/day: 1.00    Years: 30.00    Pack years: 30.00    Types: Cigarettes    Quit date: 07/10/1996    Years since quitting: 24.3  . Smokeless tobacco: Never Used  Vaping Use  . Vaping Use: Never used  Substance Use Topics  . Alcohol use: Yes    Comment: 05/08/2018 "couple drinks/yyear"  . 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    OBJCTIVE -PE, EKG, labs   Wt Readings from Last 3 Encounters:  10/27/20 205 lb 8 oz (93.2 kg)  05/30/20 193 lb 6.4 oz (87.7 kg)  11/17/19 201 lb 3.2 oz (91.3  kg)    Physical Exam: BP 130/68   Pulse 60   Ht _0  (1.854 m)   Wt 205 lb 8 oz (93.2 kg)   BMI 27.11 kg/m  Physical Exam Vitals reviewed.  Constitutional:      General: He is not in acute distress.    Appearance: Normal appearance. He is normal weight. He is not ill-appearing or toxic-appearing.     Comments: Well-nourished.  Well-groomed.  Appears younger than stated age.  HENT:     Head: Normocephalic and atraumatic.  Neck:     Vascular: No carotid bruit, hepatojugular reflux or JVD.  Cardiovascular:     Rate and Rhythm: Normal rate and regular rhythm. Occasional extrasystoles are present.    Chest Wall: PMI is not displaced.     Pulses: Normal pulses.     Heart sounds: Normal heart sounds. No murmur heard. No friction rub. No gallop.   Pulmonary:     Effort: Pulmonary effort is normal. No respiratory distress.     Breath sounds: Normal breath sounds.  Chest:     Chest wall: No tenderness.  Musculoskeletal:        General: No swelling. Normal range of motion.     Cervical back: Normal range of motion and neck supple.  Skin:    General: Skin is warm and dry.  Neurological:     General: No focal deficit present.     Mental Status: He is alert and oriented to person, place, and time.     Motor: No weakness.     Gait: Gait normal.     Comments: In usual good spirits.  Psychiatric:        Mood and  Affect: Mood normal.        Behavior: Behavior normal.        Thought Content: Thought content normal.        Judgment: Judgment normal.     Adult ECG Report  Rate: 60 ;  Rhythm: normal sinus rhythm and Nonspecific ST and T wave changes.  Otherwise normal axis, intervals and durations.;   Narrative Interpretation: Stable  Recent Labs:  Reviewed. Lab Results  Component Value Date   CHOL 112 06/27/2020   HDL 53 06/27/2020   LDLCALC 47 06/27/2020   TRIG 53 06/27/2020   CHOLHDL 2.1 06/27/2020   Lab Results  Component Value Date   CREATININE 1.42 (H) 06/27/2020    BUN 15 06/27/2020   NA 140 06/27/2020   K 4.7 06/27/2020   CL 106 06/27/2020   CO2 22 06/27/2020   CBC Latest Ref Rng & Units 09/19/2018 09/12/2018 05/09/2018  WBC 4.0 - 10.5 K/uL 5.7 4.5 5.3  Hemoglobin 13.0 - 17.0 g/dL 9.7(L) 10.5(L) 11.2(L)  Hematocrit 39.0 - 52.0 % 31.9(L) 35.8(L) 35.4(L)  Platelets 150 - 400 K/uL 172 290 178    Lab Results  Component Value Date   TSH 1.450 03/25/2018    ==================================================  COVID-19 Education: The signs and symptoms of COVID-19 were discussed with the patient and how to seek care for testing (follow up with PCP or arrange E-visit).   The importance of social distancing and COVID-19 vaccination was discussed today. The patient is practicing social distancing & Masking.   I spent a total of 73mnutes with the patient spent in direct patient consultation.  Additional time spent with chart review  / charting (studies, outside notes, etc): 14 min Total Time: 31 min   Current medicines are reviewed at length with the patient today.  (+/- concerns) n/a  This visit occurred during the SARS-CoV-2 public health emergency.  Safety protocols were in place, including screening questions prior to the visit, additional usage of staff PPE, and extensive cleaning of exam room while observing appropriate contact time as indicated for disinfecting solutions.  Notice: This dictation was prepared with Dragon dictation along with smaller phrase technology. Any transcriptional errors that result from this process are unintentional and may not be corrected upon review.  Patient Instructions / Medication Changes & Studies & Tests Ordered   Patient Instructions  Medication Instructions:  No changes  *If you need a refill on your cardiac medications before your next appointment, please call your pharmacy*   Lab Work: Not needed    Testing/Procedures: You will need a COVID-19  test  3 days prior to your procedure Myoview. This  is a Drive Up Visit at [address] [place] [id number] Someone will direct you to the appropriate testing line. Stay in your car and someone will be with you shortly. Your physician has requested that you have a lexiscan myoview.  Please follow instruction sheet, as given.  Shared Decision Making/Informed Consent{ All outpatient stress tests require an informed consent - ATTESTATION ORDER - ordered The risks [chest pain, shortness of breath, cardiac arrhythmias, dizziness, blood pressure fluctuations, myocardial infarction, stroke/transient ischemic attack, nausea, vomiting, allergic reaction, radiation exposure, metallic taste sensation and life-threatening complications (estimated to be 1 in 10,000)], benefits (risk stratification, diagnosing coronary artery disease, treatment guidance) and alternatives of a nuclear stress test were discussed in detail with Mr. GLittonand he agrees to proceed.  Follow-Up: At [facility] you and your health needs are our priority.  As  part of our continuing mission to provide you with exceptional heart care, we have created designated Provider Care Teams.  These Care Teams include your primary Cardiologist (physician) and Advanced Practice Providers (APPs -  Physician Assistants and Nurse Practitioners) who all work together to provide you with the care you need, when you need it.     Your next appointment:   6 month(s)  The format for your next appointment:   In Person  Provider:   [name], MD     Studies Ordered:   Orders Placed This Encounter  Procedures  . Cardiac Stress Test: Informed Consent Details: Physician/Practitioner Attestation; Transcribe to consent form and obtain patient signature  . MYOCARDIAL PERFUSION IMAGING  . EKG 12-Lead     [name], M.D., M.S. Interventional Cardiologist   Pager # [phone] Phone # [phone] [address] [place], [place] [address]   Thank you for choosing  Heartcare at [facility]!!

## 2020-10-28 ENCOUNTER — Telehealth (HOSPITAL_COMMUNITY): Payer: Self-pay | Admitting: *Deleted

## 2020-10-28 NOTE — Telephone Encounter (Signed)
Close encounter 

## 2020-11-01 ENCOUNTER — Telehealth (HOSPITAL_COMMUNITY): Payer: Self-pay | Admitting: *Deleted

## 2020-11-01 ENCOUNTER — Encounter: Payer: Self-pay | Admitting: Cardiology

## 2020-11-01 NOTE — Assessment & Plan Note (Signed)
Blood pressure well controlled on reduced dose of Toprol and increased dose of losartan.

## 2020-11-01 NOTE — Assessment & Plan Note (Signed)
He had PCI to SVG RCA.   Patent LIMA to LAD. Patent SVG-OM1 and OM 2, with no backflow to the native LCx he underwent PCI to native LM-LCx.   No further angina now on amlodipine, Toprol and Ranexa.

## 2020-11-01 NOTE — Assessment & Plan Note (Signed)
History of CABG x4.  Grafts are patent with overlapping stents in the SVG-RCA.  The native LCx is revascularized via LM-LCx stents to perfuse the distal LCx and OM 3 (both OM1 and OM2 are grafted, but are occluded proximally with no retrograde flow into the native LCx.)  Truthfully, ischemic evaluation should be with a Myoview stress test, however we may not get an accurate EF assessment.  Low threshold to order echo in conjunction of Myoview.

## 2020-11-01 NOTE — Assessment & Plan Note (Signed)
2 years out from his most recent PCI now with no really recurrent anginal symptoms.  However with extensive stents to both the RCA graft and the native LM-LCx, he will stay on lifelong Plavix.   Okay to hold Plavix 5-7 days preop for surgeries or procedures.    Does not need reloading when reinitiated.

## 2020-11-01 NOTE — Assessment & Plan Note (Signed)
He did notice improved energy with reduced dose of beta-blocker.  In response, we increased losartan for blood pressure control.

## 2020-11-01 NOTE — Telephone Encounter (Signed)
Close encounter 

## 2020-11-01 NOTE — Assessment & Plan Note (Signed)
He was having angina, underwent SVG FFR-followed by staged atherectomy PCI of left main into LCx.  Really has not had any angina since that procedure.  Unfortunately is not quite as active as he once was.  He had pretty classic burning chest tightness symptoms as his angina, and not noticing them with good level of activity he is able to do.  Unfortunately, he is not able to walk on a treadmill because of back pain.  He needs a stress test evaluation for DOT physical evaluation.  This will require Lexiscan Myoview.  Plan:  Lexiscan Myoview Stress Test for DOT physical ->  Informed consent discussed in patient instructions  Otherwise, continue amlodipine, Toprol and Ranexa.  On lifelong clopidogrel (okay to hold for procedures or surgeries 5 days preop)  On statin plus Zetia.  Continue current dose of losartan.

## 2020-11-01 NOTE — Assessment & Plan Note (Addendum)
Last time we checked a Myoview, he had significant symptoms in the right staged PCI to the LM-LCx. He has not had an ischemic evaluation since that PCI.   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  Technically, we could try a treadmill stress test, however with his hip pain, he does not think he could take the ramping up of speed and incline required on treadmill.  He is fearful of falling.  Plan: Lexiscan Myoview -> informed consent noted in patient instructions. => Based on his previous evaluation, he had a large fixed defect along with ischemia.  Our intention is to evaluate for ischemia.  We may need to check an echocardiogram if the EF is not adequately assessed.

## 2020-11-01 NOTE — Assessment & Plan Note (Addendum)
When he had PCI to the SVG-RCA back in 2017 he did have elevated troponins.  His anterior wall perfusion defect on Myoview at that time.  No further angina since the staged LM-LCx PCI in January 2020.

## 2020-11-01 NOTE — Assessment & Plan Note (Signed)
Labs from October 2021 reviewed.  Excellent lipids.  Continue Zetia +40 mg rosuvastatin.

## 2020-11-02 ENCOUNTER — Ambulatory Visit (HOSPITAL_COMMUNITY)
Admission: RE | Admit: 2020-11-02 | Discharge: 2020-11-02 | Disposition: A | Discharge status: Home / Self Care | Payer: Medicare Other | Source: Ambulatory Visit | Attending: Cardiology | Admitting: Cardiology

## 2020-11-02 ENCOUNTER — Other Ambulatory Visit: Payer: Self-pay

## 2020-11-02 DIAGNOSIS — Z024 Encounter for examination for driving license: Secondary | ICD-10-CM | POA: Diagnosis present

## 2020-11-02 DIAGNOSIS — I25719 Atherosclerosis of autologous vein coronary artery bypass graft(s) with unspecified angina pectoris: Secondary | ICD-10-CM | POA: Diagnosis present

## 2020-11-02 DIAGNOSIS — E119 Type 2 diabetes mellitus without complications: Secondary | ICD-10-CM | POA: Insufficient documentation

## 2020-11-02 DIAGNOSIS — I214 Non-ST elevation (NSTEMI) myocardial infarction: Secondary | ICD-10-CM | POA: Insufficient documentation

## 2020-11-02 DIAGNOSIS — I25119 Atherosclerotic heart disease of native coronary artery with unspecified angina pectoris: Secondary | ICD-10-CM | POA: Insufficient documentation

## 2020-11-02 DIAGNOSIS — Z951 Presence of aortocoronary bypass graft: Secondary | ICD-10-CM | POA: Diagnosis present

## 2020-11-02 HISTORY — PX: NM MYOVIEW LTD: HXRAD82

## 2020-11-02 LAB — MYOCARDIAL PERFUSION IMAGING
LV dias vol: 124 mL (ref 62–150)
LV sys vol: 61 mL
Peak HR: 78 {beats}/min
Rest HR: 57 {beats}/min
SDS: 2
SRS: 2
SSS: 4
TID: 0.98

## 2020-11-02 MED ORDER — REGADENOSON 0.4 MG/5ML IV SOLN
0.4000 mg | Freq: Once | INTRAVENOUS | Status: AC
  Administered 2020-11-02: 0.4 mg via INTRAVENOUS

## 2020-11-02 MED ORDER — TECHNETIUM TC 99M TETROFOSMIN IV KIT
31.0000 | PACK | Freq: Once | INTRAVENOUS | Status: AC | PRN
  Administered 2020-11-02: 31 via INTRAVENOUS
  Filled 2020-11-02: qty 31

## 2020-11-02 MED ORDER — TECHNETIUM TC 99M TETROFOSMIN IV KIT
10.5000 | PACK | Freq: Once | INTRAVENOUS | Status: AC | PRN
  Administered 2020-11-02: 10.5 via INTRAVENOUS
  Filled 2020-11-02: qty 11

## 2020-11-04 ENCOUNTER — Telehealth: Payer: Self-pay | Admitting: *Deleted

## 2020-11-04 NOTE — Telephone Encounter (Signed)
The patient has been notified of the result via dpi .  Any question may call back . Suggested patient come an get copy  with Dr Ellyn Hack comments to take to DOT examiner  Raiford Simmonds, RN 11/04/2020 11:27 AM

## 2020-11-04 NOTE — Telephone Encounter (Signed)
-----   Message from Leonie Man, MD sent at 11/03/2020  9:42 PM EST ----- Stress test shows low normal pump function.  Ejection fraction of 50%.  This is probably partially because the septal wall moves abnormally and post bypass patients.  There is a fixed defect in the inferior wall that has been there on the last 3 stress tests did appear to be pretty much fixed and consistent with prior heart attack.  Because of the size of this fixed defect, the study was read as HIGH RISK.  However the pump function is relatively normal and there is minimal evidence of reversibility (ischemia -> an area not getting enough blood flow during stress -> just the area around the old heart attack)  This study result with my interpretation need to be sent to the patient's DOT physical examiner.  Based on my interpretation of these results, with no active angina symptoms or heart failure symptoms, the I consider this to be actually a low risk functional study consistent with known anatomy.  [name], MD

## 2020-11-07 NOTE — Telephone Encounter (Signed)
Letter dictated for DOT  [name], MD

## 2020-11-08 ENCOUNTER — Telehealth: Payer: Self-pay | Admitting: *Deleted

## 2020-11-08 NOTE — Telephone Encounter (Signed)
Received a message that patient request a letter to return back to work , along with myoview report.  RN called the patient , no answer - left detailed message on voicemail that his letter is ready to be picked up along with  myoview report. Any question may call back .

## 2020-11-24 ENCOUNTER — Ambulatory Visit: Payer: Medicare Other | Admitting: Cardiology

## 2020-11-29 ENCOUNTER — Encounter: Payer: Self-pay | Admitting: Physician Assistant

## 2020-12-14 ENCOUNTER — Ambulatory Visit (INDEPENDENT_AMBULATORY_CARE_PROVIDER_SITE_OTHER): Payer: Medicare Other | Admitting: Physician Assistant

## 2020-12-14 ENCOUNTER — Other Ambulatory Visit: Payer: Self-pay

## 2020-12-14 ENCOUNTER — Encounter: Payer: Self-pay | Admitting: Physician Assistant

## 2020-12-14 ENCOUNTER — Telehealth: Payer: Self-pay

## 2020-12-14 VITALS — BP 124/84 | HR 86 | Ht 73.0 in | Wt 202.8 lb

## 2020-12-14 DIAGNOSIS — I25119 Atherosclerotic heart disease of native coronary artery with unspecified angina pectoris: Secondary | ICD-10-CM | POA: Diagnosis not present

## 2020-12-14 DIAGNOSIS — D509 Iron deficiency anemia, unspecified: Secondary | ICD-10-CM | POA: Diagnosis not present

## 2020-12-14 DIAGNOSIS — Z7901 Long term (current) use of anticoagulants: Secondary | ICD-10-CM | POA: Diagnosis not present

## 2020-12-14 MED ORDER — CLENPIQ 10-3.5-12 MG-GM -GM/160ML PO SOLN: 320.0000 mL | Freq: Once | ORAL | 0 refills | Status: AC

## 2020-12-14 NOTE — Telephone Encounter (Signed)
[facility] Pre-operative Risk Assessment     Request for surgical clearance:     Endoscopy Procedure  What type of surgery is being performed?     Colonoscopy and Endoscopy   When is this surgery scheduled?     01/24/21  What type of clearance is required ?   Pharmacy  Are there any medications that need to be held prior to surgery and how long? Plavix 5 days   Practice name and name of physician performing surgery?      [facility]  What is your office phone and fax number?      Phone- [phone]  [phone]  Anesthesia type (None, local, MAC, general) ?       MAC

## 2020-12-14 NOTE — Patient Instructions (Addendum)
If you are age 74 or older, your body mass index should be between 23-30. Your Body mass index is 26.76 kg/m. If this is out of the aforementioned range listed, please consider follow up with your Primary Care Provider.  If you are age 44 or younger, your body mass index should be between 19-25. Your Body mass index is 26.76 kg/m. If this is out of the aformentioned range listed, please consider follow up with your Primary Care Provider.   You have been scheduled for an endoscopy and colonoscopy. Please follow the written instructions given to you at your visit today. Please pick up your prep supplies at the pharmacy within the next 1-3 days. If you use inhalers (even only as needed), please bring them with you on the day of your procedure.  You will be contaced by our office prior to your procedure for directions on holding your Plavix.  If you do not hear from our office 1 week prior to your scheduled procedure, please call [phone] to discuss.   Thank you for choosing me and [facility].  Ellouise Newer, PA-C

## 2020-12-14 NOTE — Progress Notes (Signed)
Chief Complaint: Anemia  HPI:    Brandon Vasquez is a 75 year old male with a past medical history as listed below including CAD on Plavix last cath with stent placement 09/18/2018, MI and diabetes, who was referred to me by Rikki Spearing, NP for a complaint of anemia.      10/24/2011 EGD in [place] with erosive antral gastritis and otherwise normal.    11/15/2020 patient seen by [facility] for iron deficiency anemia.  At that time hemoglobin 13 (12.2 on 08/03/2019).  MCV low at 71.4.  Iron studies with iron normal at 98.  Percent saturation normal at 29%.    Today, the patient presents to clinic accompanied by his wife.  Together they explain that he has had anemia for at least 5 years but no one has ever really told him why.  He seems to do fairly well just taking oral iron supplementation.  Patient denies any GI complaints or concerns.  He does tell me that his stool is "dark", but it has been ever since he started taking iron.    Describes a colonoscopy more than 10 years ago.  Unsure of results.    Denies fever, chills, weight loss, abdominal pain, heartburn, reflux or change in bowel habits.     Past Medical History:  Diagnosis Date  . Coronary artery disease involving native heart with angina pectoris [facility]) 2005   a. 2005: CABG x5V in [place]  b. 04/2016: PCI/DES to SVG--> RCA. Severe Ost LCx (with CTO of grafted OMs - no retrograde filling) - distal Cx unprotected; RCA & LAD essentially CTO.;; 05/2018 - SVG-RCA ostial ISR with post-stent 80% - Overlapping DES & high Atm post-dilation of ISR. Progression of Native LM-ostLCx; 1/17/'20 - CSI - DES PCI dLM-ProxCx  2 overlapping DES  . Essential hypertension   . HCV (hepatitis C virus)    "treated" (05/08/2018)  . HLD (hyperlipidemia)   . Hx of CABG 2001   [place] - CABG 4: LIMA-LAD, SeqSVG-OM1-OM2,SVG-rPDA  . MI (myocardial infarction) ([place]) 2001  . Non-ST elevation MI (NSTEMI) ([place]) 04/2016   Culprit lesion was 90% SVG-RPDA -> DES PCI. Also  noted 80% ostCx treated medically (subsequent redo PCI of SVG-RCA for ISR-sequential lesion 9/'29, follwed by Staged Atherecotmy-PCI of L-pCx in1/2020)  . Type 2 diabetes mellitus with complication (HCC)    CAD    Past Surgical History:  Procedure Laterality Date  . CARDIAC CATHETERIZATION N/A 04/23/2016   Procedure: Left Heart Cath and Cors/Grafts Angiography;  Surgeon: Jettie Booze, MD;  Location: [place] [facility];  Service: Cardiovascular: ostLAD 75%->pLAD 100%, ostOM1 & OM2 100%, pRCA 80% -> dRCA 50%; LIMA-LAD patent, SVG-OM1-OM2 ~20% prox otw patentt, ost-pSVG-rPDA 90% --> PCI  . CARDIAC CATHETERIZATION N/A 04/23/2016   Procedure: Coronary Stent Intervention;  Surgeon: Jettie Booze, MD;  Location: [place] [facility];  Service: Cardiovascular;  Laterality: N/A;  DES PCI - 90% pSVG- PDA (Synergy DES 3.5 x 20)  . CORONARY ARTERY BYPASS GRAFT  2001   LIMA-LAD, SeqSVG-OM1-OM2,SVG-rPDA  . CORONARY ATHERECTOMY N/A 09/18/2018   Procedure: CORONARY ATHERECTOMY (ORBITAL);  Surgeon: Leonie Man, MD;  Location: [place] [facility];  Service: Cardiovascular: dLM-OstCx CSI -OCT - PCI - OCT.   Marland Kitchen CORONARY STENT INTERVENTION N/A 05/08/2018   Procedure: CORONARY STENT INTERVENTION;  Surgeon: Leonie Man, MD;  Location: [place] [facility];  Service: Cardiovascular;; Ost SVG-RPDA-PL 70% ISR & 75-80% just beyond stent --> Synergy DES 3.5 x 12 (overlapping) with  4.0 mm post-dilation of overlap & entire old stent (especially the [place]um)  . CORONARY STENT INTERVENTION N/A 09/18/2018   Procedure: CORONARY STENT INTERVENTION;  Surgeon: Leonie Man, MD;  Location: [place] [facility];  Service: Cardiovascular: 2 overlapping DES stent placement (Osiro 3.0 x 32 mm & 3.5 x 23 mm -postdilated and tapered fashion from 4.1 to 2.8 mm)  . INTRAVASCULAR ULTRASOUND/IVUS N/A 09/18/2018   Procedure: Intravascular OCT;  Surgeon: Leonie Man, MD;  Location: [place] [facility];  Service: Cardiovascular:  Post CSI Atherectomy OCT identified 2 additional downstream lesions for PCI; post PCI with overlapping DES OCT used to ensure stent apposition /expansion. -- small plaque thrombus extruding through stent @ most distal lesion -- IV Heparin overnight   . LAPAROSCOPIC CHOLECYSTECTOMY    . LEFT HEART CATH AND CORS/GRAFTS ANGIOGRAPHY N/A 05/08/2018   Procedure: LEFT HEART CATH AND CORS/GRAFTS ANGIOGRAPHY;  Surgeon: Leonie Man, MD;  Location: [place] [facility];  Service: Cardiovascular;; SVG-RPDA-PL 70% ostial ISR & 75% aftter stent; Progression of dLM-OstLCx to ~90-95%.-> PCI of SVG-RPDA-PL. RCA & LAD CTO.  Patent SeqSVG-OM1-OM2 (native OMs ostial CTO - no retrograde flow), Patent LIMA-mLAD  . NM MYOVIEW LTD  07/2018   Myoview 07/15/2018:  EF 45-50%. Small size fixed  inferolateral wall defect c/w prior MI. NO ischemia.  LOW RISK  . TRANSTHORACIC ECHOCARDIOGRAM  04/2016   EF 60-65%. GR 2 DD. Inferior hypokinesis. No significant valvular lesions    Current Outpatient Medications  Medication Sig Dispense Refill  . amLODipine (NORVASC) 10 MG tablet TAKE ONE TABLET BY MOUTH DAILY 90 tablet 2  . clopidogrel (PLAVIX) 75 MG tablet Take 1 tablet (75 mg total) by mouth daily. 90 tablet 3  . ezetimibe (ZETIA) 10 MG tablet TAKE ONE TABLET BY MOUTH DAILY 30 tablet 6  . Hypromellose (ARTIFICIAL TEARS OP) Apply 1-2 drops to eye daily as needed (dry eyes).    Marland Kitchen losartan (COZAAR) 50 MG tablet Take 1 tablet (50 mg total) by mouth daily. 90 tablet 3  . metFORMIN (GLUCOPHAGE-XR) 500 MG 24 hr tablet Take 1 tablet (500 mg total) by mouth daily.    . metoprolol succinate (TOPROL-XL) 25 MG 24 hr tablet TAKE ONE TABLET BY MOUTH DAILY 30 tablet 8  . nitroGLYCERIN (NITROSTAT) 0.4 MG SL tablet Place 1 tablet (0.4 mg total) under the tongue every 5 (five) minutes x 3 doses as needed for chest pain. 25 tablet 6  . ranolazine (RANEXA) 500 MG 12 hr tablet TAKE ONE TABLET BY MOUTH TWICE A DAY 60 tablet 5  . rosuvastatin  (CRESTOR) 40 MG tablet TAKE ONE TABLET BY MOUTH DAILY 90 tablet 2   No current facility-administered medications for this visit.    Allergies as of 12/14/2020  . (No Known Allergies)    Family History  Problem Relation Age of Onset  . Hypertension Mother   . Cerebral aneurysm Mother        Died in her 55s  . Alzheimer's disease Father     Social History   Socioeconomic History  . Marital status: Married    Spouse name: Not on file  . Number of children: Not on file  . Years of education: Not on file  . Highest education level: Not on file  Occupational History  . Occupation: Retired  Tobacco Use  . Smoking status: Former Smoker    Packs/day: 1.00    Years: 30.00    Pack years: 30.00    Types: Cigarettes  Quit date: 07/10/1996    Years since quitting: 24.4  . Smokeless tobacco: Never Used  Vaping Use  . Vaping Use: Never used  Substance and Sexual Activity  . Alcohol use: Yes    Comment: 05/08/2018 "couple drinks/yyear"  . Drug use: Not Currently    Types: Marijuana    Comment: 05/08/2018 "nothing in 2019"  . Sexual activity: Not Currently  Other Topics Concern  . 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   Social Determinants of Health   Financial Resource Strain: Not on file  Food Insecurity: Not on file  Transportation Needs: Not on file  Physical Activity: Not on file  Stress: Not on file  Social Connections: Not on file  Intimate Partner Violence: Not on file    Review of Systems:    Constitutional: No weight loss, fever or chills Skin: No rash  Cardiovascular: No chest pain Respiratory: No SOB  Gastrointestinal: See HPI and otherwise negative Genitourinary: No dysuria  Neurological: No headache, dizziness or syncope Musculoskeletal: No new muscle or joint pain Hematologic: No bleeding  Psychiatric: No history of depression or  anxiety   Physical Exam:  Vital signs: BP 124/84 (BP Location: Left Arm, Patient Position: Sitting)   Pulse 86   Ht 6' 1" (1.854 m)   Wt 202 lb 12.8 oz (92 kg)   SpO2 98%   BMI 26.76 kg/m   Constitutional:   Pleasant AA male appears to be in NAD, Well developed, Well nourished, alert and cooperative Head:  Normocephalic and atraumatic. Eyes:   PEERL, EOMI. No icterus. Conjunctiva pink. Ears:  Normal auditory acuity. Neck:  Supple Throat: Oral cavity and pharynx without inflammation, swelling or lesion.  Respiratory: Respirations even and unlabored. Lungs clear to auscultation bilaterally.   No wheezes, crackles, or rhonchi.  Cardiovascular: Normal S1, S2. No MRG. Regular rate and rhythm. No peripheral edema, cyanosis or pallor.  Gastrointestinal:  Soft, nondistended, nontender. No rebound or guarding. Normal bowel sounds. No appreciable masses or hepatomegaly. Rectal:  Not performed.  Msk:  Symmetrical without gross deformities. Without edema, no deformity or joint abnormality.  Neurologic:  Alert and  oriented x4;  grossly normal neurologically.  Skin:   Dry and intact without significant lesions or rashes. Psychiatric:  Demonstrates good judgement and reason without abnormal affect or behaviors.  See HPI for recent labs.  Assessment: 1.  Iron deficiency anemia: For the past 5 years, seems to do well with oral iron supplementation, last colonoscopy greater than 10 years ago, EGD in 2013 2.  Chronic anticoagulation status post stent for CAD: With Plavix  Plan: 1.  Scheduled patient for a diagnostic EGD and colonoscopy in the [place] with Dr. Bryan Lemma as he had sooner availability.  Did provide the patient with a detailed list of risks for the procedures and he agrees to proceed. 2.  Continue oral iron and follow-up with PCP in regards to blood draws. 3.  Patient to follow in clinic per recommendations from Dr. Bryan Lemma after time of procedures  Ellouise Newer, PA-C [facility] 12/14/2020, 11:16 AM  Cc: Rikki Spearing, NP

## 2020-12-15 NOTE — Telephone Encounter (Addendum)
    Brandon Vasquez DOB:  02/01/1946  MRN:  [id number]   Primary Cardiologist: [name], MD  Chart reviewed as part of pre-operative protocol coverage. Given past medical history and time since last visit, based on ACC/AHA guidelines, Brandon Vasquez would be at acceptable risk for the planned procedure without further cardiovascular testing.   Per Dr. Allison Quarry note 10/27/20: "on lifelong Clopidogrel (okay to hold for procedures or surgeries 5 days pre-op).   Recently seen 10/27/20 and he was having chest pain and a myoview was ordered.  Per Dr. Ellyn Hack 11/02/20 "Stress test shows low normal pump function. Ejection fraction of 50%. This is probably partially because the septal wall moves abnormally and post bypass patients.  There is a fixed defect in the inferior wall that has been there on the last 3 stress tests did appear to be pretty much fixed and consistent with prior heart attack. Because of the size of this fixed defect, the study was read as HIGH RISK. However the pump function is relatively normal and there is minimal evidence of reversibility (ischemia -> an area not getting enough blood flow during stress -> just the area around the old heart attack)  This study result with my interpretation need to be sent to the patient's DOT physical examiner.  Based on my interpretation of these results, with no active angina symptoms or heart failure symptoms, the I consider this to be actually a low risk functional study consistent with known anatomy."  [name], MD  I spoke to the patient, and he denies any anginal symptoms.   I will route this recommendation to the requesting party via Epic fax function and remove from pre-op pool.  Please call with questions.  Darren Caldron Ninfa Meeker, PA-C 12/15/2020, 8:02 AM

## 2020-12-15 NOTE — Telephone Encounter (Signed)
Agree with Low Risk - ok to hold Plavix for procedures. [name], MD

## 2020-12-21 NOTE — Telephone Encounter (Signed)
Called patient and let him know to hold Plavix 5 days before his procedure. Patient stated he understood.

## 2020-12-27 NOTE — Progress Notes (Signed)
Agree with the assessment and plan as outlined by Jennifer Lemmon, PA-C. ? ?Tiffiny Worthy, DO, FACG ? ?

## 2021-01-11 ENCOUNTER — Other Ambulatory Visit: Payer: Self-pay | Admitting: Cardiology

## 2021-01-11 ENCOUNTER — Other Ambulatory Visit: Payer: Self-pay | Admitting: Physician Assistant

## 2021-01-24 ENCOUNTER — Encounter: Payer: Self-pay | Admitting: Gastroenterology

## 2021-01-24 ENCOUNTER — Other Ambulatory Visit: Payer: Self-pay

## 2021-01-24 ENCOUNTER — Ambulatory Visit (AMBULATORY_SURGERY_CENTER): Payer: Medicare Other | Admitting: Gastroenterology

## 2021-01-24 VITALS — BP 140/72 | HR 52 | Temp 98.6°F | Resp 9 | Ht 73.0 in | Wt 202.0 lb

## 2021-01-24 DIAGNOSIS — D120 Benign neoplasm of cecum: Secondary | ICD-10-CM | POA: Diagnosis not present

## 2021-01-24 DIAGNOSIS — D128 Benign neoplasm of rectum: Secondary | ICD-10-CM

## 2021-01-24 DIAGNOSIS — K621 Rectal polyp: Secondary | ICD-10-CM | POA: Diagnosis not present

## 2021-01-24 DIAGNOSIS — K2950 Unspecified chronic gastritis without bleeding: Secondary | ICD-10-CM | POA: Diagnosis not present

## 2021-01-24 DIAGNOSIS — D125 Benign neoplasm of sigmoid colon: Secondary | ICD-10-CM | POA: Diagnosis not present

## 2021-01-24 DIAGNOSIS — D509 Iron deficiency anemia, unspecified: Secondary | ICD-10-CM | POA: Diagnosis present

## 2021-01-24 DIAGNOSIS — D124 Benign neoplasm of descending colon: Secondary | ICD-10-CM

## 2021-01-24 DIAGNOSIS — K641 Second degree hemorrhoids: Secondary | ICD-10-CM

## 2021-01-24 DIAGNOSIS — K5730 Diverticulosis of large intestine without perforation or abscess without bleeding: Secondary | ICD-10-CM

## 2021-01-24 DIAGNOSIS — K2990 Gastroduodenitis, unspecified, without bleeding: Secondary | ICD-10-CM

## 2021-01-24 DIAGNOSIS — K2970 Gastritis, unspecified, without bleeding: Secondary | ICD-10-CM | POA: Diagnosis not present

## 2021-01-24 MED ORDER — SODIUM CHLORIDE 0.9 % IV SOLN
500.0000 mL | Freq: Once | INTRAVENOUS | Status: DC
Start: 2021-01-24 — End: 2021-01-24

## 2021-01-24 NOTE — Op Note (Signed)
[place] Patient Name: Brandon Vasquez Procedure Date: 01/24/2021 2:00 PM MRN: [id number] Endoscopist: Gerrit Heck , MD Age: 75 Referring MD:  Date of Birth: [birth year] Gender: Male Account #: [id number] Procedure:                Colonoscopy Indications:              Iron deficiency anemia                           Last colonoscopy was 10+ years ago. Medicines:                Monitored Anesthesia Care Procedure:                Pre-Anesthesia Assessment:                           - Prior to the procedure, a History and Physical                            was performed, and patient medications and                            allergies were reviewed. The patient's tolerance of                            previous anesthesia was also reviewed. The risks                            and benefits of the procedure and the sedation                            options and risks were discussed with the patient.                            All questions were answered, and informed consent                            was obtained. Prior Anticoagulants: The patient has                            taken Plavix (clopidogrel), last dose was 5 days                            prior to procedure. ASA Grade Assessment: III - A                            patient with severe systemic disease. After                            reviewing the risks and benefits, the patient was                            deemed in satisfactory condition to undergo the  procedure.                           After obtaining informed consent, the colonoscope                            was passed under direct vision. Throughout the                            procedure, the patient's blood pressure, pulse, and                            oxygen saturations were monitored continuously. The                            Olympus CF-HQ190L ([id number]) Colonoscope was                            introduced  through the anus and advanced to the the                            terminal ileum. The colonoscopy was performed                            without difficulty. The patient tolerated the                            procedure well. The quality of the bowel                            preparation was good. The terminal ileum, ileocecal                            valve, appendiceal orifice, and rectum were                            photographed. Scope In: 2:30:36 PM Scope Out: 2:50:58 PM Scope Withdrawal Time: 0 hours 18 minutes 36 seconds  Total Procedure Duration: 0 hours 20 minutes 22 seconds  Findings:                 The perianal and digital rectal examinations were                            normal.                           Five sessile polyps were found in the sigmoid colon                            (1), descending colon (2), and cecum (2). The                            polyps were 3 to 5 mm in size. These polyps were  removed with a cold snare. Resection and retrieval                            were complete. Estimated blood loss was minimal.                           A 3 mm polyp was found in the rectum. The polyp was                            sessile. The polyp was removed with a cold snare.                            Resection and retrieval were complete. Estimated                            blood loss was minimal.                           A few small-mouthed diverticula were found in the                            sigmoid colon and transverse colon.                           Non-bleeding internal hemorrhoids were found during                            retroflexion. The hemorrhoids were medium-sized.                           The terminal ileum appeared normal. Complications:            No immediate complications. Estimated Blood Loss:     Estimated blood loss was minimal. Impression:               - Five 3 to 5 mm polyps in the sigmoid colon, in                             the descending colon and in the cecum, removed with                            a cold snare. Resected and retrieved.                           - One 3 mm polyp in the rectum, removed with a cold                            snare. Resected and retrieved.                           - Diverticulosis in the sigmoid colon and in the                            transverse colon.                           -  Non-bleeding internal hemorrhoids.                           - The examined portion of the ileum was normal. Recommendation:           - Patient has a contact number available for                            emergencies. The signs and symptoms of potential                            delayed complications were discussed with the                            patient. Return to normal activities tomorrow.                            Written discharge instructions were provided to the                            patient.                           - Resume previous diet.                           - Continue present medications.                           - Await pathology results.                           - Repeat colonoscopy for surveillance based on                            pathology results.                           - Use fiber, for example Citrucel, Fibercon, Konsyl                            or Metamucil.                           - Internal hemorrhoids were noted on this study and                            may be amenable to hemorrhoid band ligation. If you                            are interested in further treatment of these                            hemorrhoids with band ligation, please contact my  clinic to set up an appointment for evaluation and                            treatment.                           - Resume Plavix (clopidogrel) at prior dose                            tomorrow. Gerrit Heck, MD 01/24/2021 3:02:24 PM

## 2021-01-24 NOTE — Op Note (Signed)
[name]: Brandon Vasquez Procedure Date: 01/24/2021 2:08 PM MRN: [id number] Endoscopist: Gerrit Heck , MD Age: 75 Referring MD:  Date of Birth: [birth year] Gender: Male Account #: [id number] Procedure:                Upper GI endoscopy Indications:              Iron deficiency anemia Medicines:                Monitored Anesthesia Care Procedure:                Pre-Anesthesia Assessment:                           - Prior to the procedure, a History and Physical                            was performed, and patient medications and                            allergies were reviewed. The patient's tolerance of                            previous anesthesia was also reviewed. The risks                            and benefits of the procedure and the sedation                            options and risks were discussed with the patient.                            All questions were answered, and informed consent                            was obtained. Prior Anticoagulants: The patient has                            taken Plavix (clopidogrel), last dose was 5 days                            prior to procedure. ASA Grade Assessment: III - A                            patient with severe systemic disease. After                            reviewing the risks and benefits, the patient was                            deemed in satisfactory condition to undergo the                            procedure.  After obtaining informed consent, the endoscope was                            passed under direct vision. Throughout the                            procedure, the patient's blood pressure, pulse, and                            oxygen saturations were monitored continuously. The                            Endoscope was introduced through the mouth, and                            advanced to the third part of duodenum. The upper                             GI endoscopy was accomplished without difficulty.                            The patient tolerated the procedure well. Scope In: Scope Out: Findings:                 The examined esophagus was normal.                           Mild inflammation characterized by erythema was                            found in the gastric body and in the gastric                            antrum. Biopsies were taken with a cold forceps for                            Helicobacter pylori testing. Estimated blood loss                            was minimal.                           The duodenal bulb, first portion of the duodenum,                            second portion of the duodenum and third portion of                            the duodenum were normal. Biopsies for histology                            were taken with a cold forceps for evaluation of  celiac disease. Estimated blood loss was minimal. Complications:            No immediate complications. Estimated Blood Loss:     Estimated blood loss was minimal. Impression:               - Normal esophagus.                           - Gastritis. Biopsied.                           - Normal duodenal bulb, first portion of the                            duodenum, second portion of the duodenum and third                            portion of the duodenum. Biopsied. Recommendation:           - Patient has a contact number available for                            emergencies. The signs and symptoms of potential                            delayed complications were discussed with the                            patient. Return to normal activities tomorrow.                            Written discharge instructions were provided to the                            patient.                           - Resume previous diet.                           - Continue present medications.                           - Await pathology  results.                           - Perform a colonoscopy today.                           - If biopsies from this procedure and the                            colonoscopy are unrevealing, will plan on further                            small bowel interrogation with video capsule  endoscopy. Gerrit Heck, MD 01/24/2021 2:58:03 PM

## 2021-01-24 NOTE — Progress Notes (Signed)
VS taken by [place] 

## 2021-01-24 NOTE — Progress Notes (Signed)
pt tolerated well. VSS. awake and to recovery. Report given to RN.  

## 2021-01-24 NOTE — Patient Instructions (Signed)
Handouts given for gastritis, polyps, high fiber diet, diverticulosis and hemorrhoid banding.  RESUME PLAVIX AT PRIOR DOSE TOMORROW Wednesday 01/25/21  YOU HAD AN ENDOSCOPIC PROCEDURE TODAY AT THE [facility]:   Refer to the procedure report that was given to you for any specific questions about what was found during the examination.  If the procedure report does not answer your questions, please call your gastroenterologist to clarify.  If you requested that your care partner not be given the details of your procedure findings, then the procedure report has been included in a sealed envelope for you to review at your convenience later.  YOU SHOULD EXPECT: Some feelings of bloating in the abdomen. Passage of more gas than usual.  Walking can help get rid of the air that was put into your GI tract during the procedure and reduce the bloating. If you had a lower endoscopy (such as a colonoscopy or flexible sigmoidoscopy) you may notice spotting of blood in your stool or on the toilet paper. If you underwent a bowel prep for your procedure, you may not have a normal bowel movement for a few days.  Please Note:  You might notice some irritation and congestion in your nose or some drainage.  This is from the oxygen used during your procedure.  There is no need for concern and it should clear up in a day or so.  SYMPTOMS TO REPORT IMMEDIATELY:   Following lower endoscopy (colonoscopy or flexible sigmoidoscopy):  Excessive amounts of blood in the stool  Significant tenderness or worsening of abdominal pains  Swelling of the abdomen that is new, acute  Fever of 100F or higher   Following upper endoscopy (EGD)  Vomiting of blood or coffee ground material  New chest pain or pain under the shoulder blades  Painful or persistently difficult swallowing  New shortness of breath  Fever of 100F or higher  Black, tarry-looking stools  For urgent or emergent issues, a gastroenterologist can  be reached at any hour by calling [phone]. Do not use MyChart messaging for urgent concerns.   DIET:  We do recommend a small meal at first, but then you may proceed to your regular diet.  Drink plenty of fluids but you should avoid alcoholic beverages for 24 hours.  ACTIVITY:  You should plan to take it easy for the rest of today and you should NOT DRIVE or use heavy machinery until tomorrow (because of the sedation medicines used during the test).    FOLLOW UP: Our staff will call the number listed on your records 48-72 hours following your procedure to check on you and address any questions or concerns that you may have regarding the information given to you following your procedure. If we do not reach you, we will leave a message.  We will attempt to reach you two times.  During this call, we will ask if you have developed any symptoms of COVID 19. If you develop any symptoms (ie: fever, flu-like symptoms, shortness of breath, cough etc.) before then, please call [phone].  If you test positive for Covid 19 in the 2 weeks post procedure, please call and report this information to [place].    If any biopsies were taken you will be contacted by phone or by letter within the next 1-3 weeks.  Please call us at [phone] if you have not heard about the biopsies in 3 weeks.    SIGNATURES/CONFIDENTIALITY: You and/or your care partner have signed paperwork which  will be entered into your electronic medical record.  These signatures attest to the fact that that the information above on your After Visit Summary has been reviewed and is understood.  Full responsibility of the confidentiality of this discharge information lies with you and/or your care-partner.

## 2021-01-25 ENCOUNTER — Other Ambulatory Visit: Payer: Self-pay | Admitting: Cardiology

## 2021-01-26 ENCOUNTER — Telehealth: Payer: Self-pay

## 2021-01-26 NOTE — Telephone Encounter (Signed)
  Follow up Call-  Call back number 01/24/2021  Post procedure Call Back phone  # [phone], [phone]  Permission to leave phone message Yes  Some recent data might be hidden     Patient questions:  Do you have a fever, pain , or abdominal swelling? No. Pain Score  0 *  Have you tolerated food without any problems? Yes.    Have you been able to return to your normal activities? Yes.    Do you have any questions about your discharge instructions: Diet   No. Medications  No. Follow up visit  No.  Do you have questions or concerns about your Care? No.  Actions: * If pain score is 4 or above: No action needed, pain <4.   1. Have you developed a fever since your procedure? No   2.   Have you had an respiratory symptoms (SOB or cough) since your procedure? No   3.   Have you tested positive for COVID 19 since your procedure no   4.   Have you had any family members/close contacts diagnosed with the COVID 19 since your procedure?  No    If yes to any of these questions please route to Joylene John, RN and Joella Prince, RN

## 2021-02-03 ENCOUNTER — Encounter: Payer: Self-pay | Admitting: Gastroenterology

## 2021-03-17 ENCOUNTER — Other Ambulatory Visit: Payer: Self-pay | Admitting: Physician Assistant

## 2021-04-27 ENCOUNTER — Other Ambulatory Visit: Payer: Self-pay

## 2021-04-27 ENCOUNTER — Encounter: Payer: Self-pay | Admitting: Cardiology

## 2021-04-27 ENCOUNTER — Ambulatory Visit (INDEPENDENT_AMBULATORY_CARE_PROVIDER_SITE_OTHER): Payer: Medicare Other | Admitting: Cardiology

## 2021-04-27 VITALS — BP 132/79 | HR 62 | Ht 73.0 in | Wt 198.2 lb

## 2021-04-27 DIAGNOSIS — I10 Essential (primary) hypertension: Secondary | ICD-10-CM

## 2021-04-27 DIAGNOSIS — Z95820 Peripheral vascular angioplasty status with implants and grafts: Secondary | ICD-10-CM

## 2021-04-27 DIAGNOSIS — E1169 Type 2 diabetes mellitus with other specified complication: Secondary | ICD-10-CM

## 2021-04-27 DIAGNOSIS — Z951 Presence of aortocoronary bypass graft: Secondary | ICD-10-CM

## 2021-04-27 DIAGNOSIS — I25719 Atherosclerosis of autologous vein coronary artery bypass graft(s) with unspecified angina pectoris: Secondary | ICD-10-CM

## 2021-04-27 DIAGNOSIS — I25119 Atherosclerotic heart disease of native coronary artery with unspecified angina pectoris: Secondary | ICD-10-CM | POA: Diagnosis not present

## 2021-04-27 DIAGNOSIS — E785 Hyperlipidemia, unspecified: Secondary | ICD-10-CM | POA: Diagnosis not present

## 2021-04-27 NOTE — Progress Notes (Signed)
Primary Care Provider: Rikki Spearing, NP Cardiologist: [name], MD Electrophysiologist: None  Clinic Note: Chief Complaint  Patient presents with   Follow-up    41-monthdoing well.   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  He just had a follow-up Myoview which was read as moderate risk because of the moderate sized infarction however there is no change from 2019 which predated his previous PCI.  Functionally this is a low risk study.  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  Plan:  Continue with combination of Ranexa amlodipine and metoprolol for antianginal benefit with no recurrent symptoms. On lifelong Plavix monotherapy due to the extent of CAD and PCI: Okay to hold Plavix 5 to 7 days preop for surgeries or procedures.. On combination of high-dose rosuvastatin plus Zetia       Coronary artery disease involving autologous vein coronary bypass graft with angina pectoris ([place] (Chronic)    He has moderate residual disease in the SVG-OM1-OM2 (with no retrograde flow to the native LCx) vessels with overlapping stents in the SVG-PDA-PL.  Patent LIMA to LAD.  Recent Myoview similar to 2019.  Evidence of prior infarct, but no ischemia.  On stable regimen.  Due for labs to be checked.      Essential hypertension (Chronic)    Borderline blood pressure today on stable regimen of losartan, Lopressor and amlodipine.        Other   [place] 5[place]   Hyperlipidemia associated with type 2 diabetes mellitus ([place])    Excellent lipids in October 2021.  Due for follow-up this fall in October.   Asked to discuss with his PCP to make sure that they recheck lipid panel and chemistries along with A1c.  Continue rosuvastatin and Zetia.. On metformin for diabetes.  Will defer to PCP.  Low threshold consider SGLT2 inhibitor.      S/P angioplasty with stent distal LM and LCX. 09/18/18 (Chronic)    Extensive PCI to the native LCx as well as overlapping stents to the SVG-RCA system.  On lifelong monotherapy with clopidogrel/Plavix.  Okay to hold Plavix 507 days preop for surgery or procedures.      ===================================  HPI:    Brandon GALINDOis a 75y.o. male with a PMH notable for multivessel CAD-CABG followed by non-STEMI And Unstable Angina treated with PCI who presents today for 656-monthollow-up.  2001 (in [place])-> after presenting with cardiac arrest and emergent catheterization  Cath=> MULTIVESSEL CORONARY ARTERY DISEASE s/p CABG 4 (LIMA-LAD {could be to D1-LAD, SVG-RPDA, SVG-OM1-OM 2) i- 04/2016 NSTEMI  -> PCI to SVG-RCA, med Rx of Native Cx disease.  Myoview stress test November 2018: EF 44%.  Medium size moderate severity fixed defect in the basal inferolateral mid inferolateral wall consistent with prior infarct with mild peri-infarct ischemia.  LOW RISK. 05/08/2018: Progressive Angina -  Cath - ISR SVG-RCA =-> DES PCI & PTCA; non-revascularized Native Cx - ostial 90% (mild progression) --> subsequently failed medical management. Myoview November 2019: EF 48%.  Small inferior-lateral wall infarct apex to base with septal thinning at  base.  Thought to be consistent with prior MI but no ischemia.  (On my review, this appeared to have clear evidence of at least mild ischemia) January 2020: RECURRENT UNSTABLE ANGINA  Atherectomy based/OCT guided PCI LM-LCx: 2 overlapping DES stent placement (Osiro 3.0 x 32 mm & 3.5 x 23 mm -postdilated and tapered fashion from 4.1 to 2.8 mm)  Breslin Hardcastle was last seen on October 27, 2020 for routine follow-up and pre-DOT  physical evaluation requiring stress test.  Recent Hospitalizations: none  Reviewed  CV studies:    The following studies were reviewed today: (if available, images/films reviewed: From Epic Chart or Care Everywhere) Myoview 11/02/2020: EF 50 to 55%.  Septal wall motion consistent with CABG.  No EKG changes.  PVCs noted in both rest and recovery.  Moderate sized fixed defect in the basal-mid inferior/inferolateral wall consistent with prior infarct and small area of peri-infarct ischemia.  No change from 2019..  Study read as is Moderate Risk because of moderate-sized infarction, however no change compared to 2019-medical management.  FUNCTIONALLY LOW RISK  Recent Colonoscopy  Interval History:   Macarthur Stenzel presents here today for his 6-month follow-up.  He states that he is doing fairly well.  He has not had any chest pain or pressure with rest or exertion.  No exertional dyspnea.  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  He is a little bit frustrated because he has not been getting that much work to do, but is happy that he is doing something. Otherwise, he is try to maintain some level activity, but because of driving is not going to the gym as frequently.  No major issues.  He just notes that if he overdoes it 1 day, he needs to take a day of rest the next day.  Just feels like he is getting a little bit older.  CV Review of Symptoms (Summary) Cardiovascular ROS: no chest pain or dyspnea on exertion positive for - mild end of day swelling negative for - chest pain, dyspnea on exertion, irregular heartbeat, orthopnea, palpitations, paroxysmal nocturnal dyspnea, rapid heart rate, shortness of breath, or lightheadedness and dizziness or wooziness, syncope/near syncope or TIA/amaurosis fugax, claudication  REVIEWED OF SYSTEMS   Review of Systems  Constitutional:  Negative for malaise/fatigue (just needs to take a day to rest if he over does it.) and weight  loss.  HENT:  Positive for tinnitus. Negative for congestion, hearing loss and nosebleeds.   Respiratory:  Negative for cough and shortness of breath.   Cardiovascular:  Negative for leg swelling.  Gastrointestinal:  Negative for abdominal pain, blood in stool, constipation and melena.  Genitourinary:  Negative for hematuria.  Musculoskeletal:  Negative for joint pain (old age related OA).  Neurological:  Negative for dizziness, focal weakness, weakness and headaches.  Psychiatric/Behavioral:  Negative for memory loss. The patient is not nervous/anxious and does not have insomnia.    I have reviewed and (if needed) personally updated the patient's problem list, medications, allergies, past medical and surgical history, social and family history.   PAST MEDICAL HISTORY   Past Medical History:  Diagnosis Date   Anemia    Coronary artery disease involving native heart with angina pectoris (HCC) 2005   a. 2005: CABG x5V in [place]  b. 04/2016: PCI/DES to SVG--> RCA. Severe Ost LCx (with CTO of grafted OMs - no retrograde filling) - distal Cx unprotected; RCA & LAD essentially CTO.;; 05/2018 - SVG-RCA ostial   ISR with post-stent 80% - Overlapping DES & high Atm post-dilation of ISR. 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 Also noted 80% ostCx treated medically (subsequent redo PCI of SVG-RCA for ISR-sequential lesion 9/'29, follwed by Staged Atherecotmy-PCI of L-pCx in1/2020)   Type 2 diabetes mellitus with complication (HCC)    CAD    PAST SURGICAL HISTORY   Past Surgical History:  Procedure Laterality Date   CARDIAC CATHETERIZATION N/A  04/23/2016   Procedure: Left Heart Cath and Cors/Grafts Angiography;  Surgeon: Jayadeep S Varanasi, MD;  [facility];  Service: Cardiovascular: ostLAD 75%->pLAD 100%, ostOM1 & OM2 100%, pRCA 80% -> dRCA 50%; LIMA-LAD patent, SVG-OM1-OM2 ~20% prox otw patentt, ost-pSVG-rPDA 90% --> PCI   CARDIAC CATHETERIZATION N/A 04/23/2016   Procedure: Coronary Stent Intervention;  Surgeon: Jayadeep S Varanasi, MD;  [facility]: [facility];  Service: Cardiovascular;  Laterality: N/A;  DES PCI - 90% pSVG- PDA (Synergy DES 3.5 x 20)   COLONOSCOPY     CORONARY ARTERY BYPASS GRAFT  2001   LIMA-LAD, SeqSVG-OM1-OM2,SVG-rPDA   CORONARY ATHERECTOMY N/A 09/18/2018   Procedure: CORONARY ATHERECTOMY (ORBITAL);  Surgeon: Harding, [name] W, MD;  [facility];  Service: Cardiovascular: dLM-OstCx CSI -OCT - PCI - OCT.    CORONARY STENT INTERVENTION N/A 05/08/2018   Procedure: CORONARY STENT INTERVENTION;  Surgeon: Harding, [name] W, MD;  [facility];  Service: Cardiovascular;; Ost SVG-RPDA-PL 70% ISR & 75-80% just beyond stent --> Synergy DES 3.5 x 12 (overlapping) with 4.0 mm post-dilation of overlap & entire old stent (especially the Ostum)   CORONARY STENT INTERVENTION N/A 09/18/2018   Procedure: CORONARY STENT INTERVENTION;  Surgeon: Harding, [name] W, MD;  [facility];  Service: Cardiovascular: 2 overlapping DES stent placement (Osiro 3.0 x 32 mm & 3.5 x 23 mm -postdilated and tapered fashion from 4.1 to 2.8 mm)   INTRAVASCULAR ULTRASOUND/IVUS N/A 09/18/2018   Procedure: Intravascular OCT;  Surgeon: Harding, [name] W, MD;  [facility];  Service: Cardiovascular: Post CSI Atherectomy OCT identified 2 additional downstream lesions for PCI; post PCI with overlapping DES OCT used to ensure stent apposition /expansion. -- small plaque thrombus extruding through stent @ most distal lesion -- IV Heparin overnight    LAPAROSCOPIC CHOLECYSTECTOMY      LEFT HEART CATH AND CORS/GRAFTS ANGIOGRAPHY N/A 05/08/2018   Procedure: LEFT HEART CATH AND CORS/GRAFTS ANGIOGRAPHY;  Surgeon: Harding, [name] W, MD;  [facility];  Service: Cardiovascular;; SVG-RPDA-PL 70% ostial ISR & 75% aftter stent; Progression of dLM-OstLCx to ~90-95%.-> PCI of SVG-RPDA-PL. RCA & LAD CTO.  Patent SeqSVG-OM1-OM2 (native OMs ostial CTO - no retrograde flow), Patent LIMA-mLAD   NM MYOVIEW LTD  07/2018   Myoview 07/15/2018:  EF 45-50%. Small size fixed  inferolateral wall defect c/w prior MI. NO ischemia.  LOW RISK   NM MYOVIEW LTD  11/02/2020   EF 50 to 55%.  Septal wall motion c/w CABG.  No EKG changes.  PVCs in both rest and recovery.  Mod- sized fixed defect in the   basal-mid inferior/inferolateral wall c/w prior infarct & small area of peri-infarct ischemia.  No change from 2019..  Read as Moderate Risk b/c Mod-sized infarction, however no change compared to 2019-medical management.  Functionally, low risk.   TRANSTHORACIC ECHOCARDIOGRAM  04/2016   EF 60-65%. GR 2 DD. Inferior hypokinesis. No significant valvular lesions   UPPER GASTROINTESTINAL ENDOSCOPY     05/08/2018: Ost SVG-RPDA-PL 70% ISR & 75-80% just beyond stent --> Synergy DES 3.5 x 12 (overlapping previous stent) => 4.0 mm post-dilation of overlap & entire old stent (esp. Ostum)    09/19/2018: PCI LM-Cx (CSI, OCT)--  Mid LM 95%(involving LAD - that is 100% CTO), Ost Lcx 90%, prox-mid 80% & 75% => 2 overlapping Orsiro DES 3.5 x 23; 3.0 x 30 (post-dilated 4.0 - 3.8 - 3.3 mm)    Immunization History  Administered Date(s) Administered   Influenza, High Dose Seasonal PF 05/09/2018   PFIZER(Purple Top)SARS-COV-2 Vaccination 10/27/2019, 11/17/2019   Pneumococcal Polysaccharide-23 05/09/2018   Tdap 03/28/2017    MEDICATIONS/ALLERGIES   Current Meds  Medication Sig   amLODipine (NORVASC) 10 MG tablet TAKE ONE TABLET BY MOUTH DAILY   clopidogrel (PLAVIX) 75 MG tablet Take 1 tablet (75 mg total) by  mouth daily.   ezetimibe (ZETIA) 10 MG tablet TAKE ONE TABLET BY MOUTH DAILY   Hypromellose (ARTIFICIAL TEARS OP) Apply 1-2 drops to eye daily as needed (dry eyes).   losartan (COZAAR) 50 MG tablet TAKE ONE TABLET BY MOUTH DAILY   metFORMIN (GLUCOPHAGE-XR) 500 MG 24 hr tablet Take 1 tablet (500 mg total) by mouth daily.   metoprolol succinate (TOPROL-XL) 25 MG 24 hr tablet TAKE ONE TABLET BY MOUTH DAILY   nitroGLYCERIN (NITROSTAT) 0.4 MG SL tablet PLACE ONE TABLET UNDER THE TONGUE EVERY FIVE MINUTES FOR THREE DOSES AS NEEDED FOR CHEST PAIN   ranolazine (RANEXA) 500 MG 12 hr tablet TAKE ONE TABLET BY MOUTH TWICE A DAY   rosuvastatin (CRESTOR) 40 MG tablet TAKE ONE TABLET BY MOUTH DAILY    No Known Allergies  SOCIAL HISTORY/FAMILY HISTORY   Reviewed in Epic:  Pertinent findings:  Social History   Tobacco Use   Smoking status: Former    Packs/day: 1.00    Years: 30.00    Pack years: 30.00    Types: Cigarettes    Quit date: 07/10/1996    Years since quitting: 24.8   Smokeless tobacco: Never  Vaping Use   Vaping Use: Some days   Start date: 01/01/2021   Substances: Nicotine   Devices: vap pen  Substance Use Topics   Alcohol use: Yes    Comment: 05/08/2018 "couple drinks/yyear"   Drug use: Not Currently    Types: Marijuana    Comment: 05/08/2018 "nothing in 2019"   Social History   Social History Narrative   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    OBJCTIVE -PE, EKG, labs   Wt Readings from Last 3 Encounters:  04/27/21 198 lb 3.2 oz (89.9 kg)  01/24/21 202 lb (91.6 kg)  12/14/20 202 lb 12.8 oz (92 kg)    Physical Exam: BP 132/79   Pulse 62   Ht 6' 1" (1.854 m)   Wt 198 lb 3.2 oz (89.9 kg)   SpO2 97%   BMI 26.15 kg/m  Physical Exam Constitutional:      General: He is not in acute distress.    Appearance: Normal appearance. He   is normal weight. He is not ill-appearing (Appears younger  than stated age.  Well-nourished well-groomed.) or toxic-appearing.  HENT:     Head: Normocephalic and atraumatic.  Neck:     Vascular: No carotid bruit, hepatojugular reflux or JVD.  Cardiovascular:     Rate and Rhythm: Normal rate and regular rhythm. No extrasystoles are present.    Chest Wall: PMI is not displaced.     Pulses: Normal pulses.     Heart sounds: Normal heart sounds. No murmur heard.   No friction rub. No gallop.  Pulmonary:     Effort: Pulmonary effort is normal. No respiratory distress.     Breath sounds: Normal breath sounds. No stridor. No wheezing, rhonchi or rales.  Musculoskeletal:        General: No swelling. Normal range of motion.     Cervical back: Normal range of motion and neck supple.  Skin:    General: Skin is warm and dry.  Neurological:     General: No focal deficit present.     Mental Status: He is alert and oriented to person, place, and time.     Motor: No weakness.     Gait: Gait normal.  Psychiatric:        Mood and Affect: Mood normal.        Behavior: Behavior normal.        Thought Content: Thought content normal.        Judgment: Judgment normal.     Adult ECG Report Not checked  Recent Labs: Reviewed.  Due for lab check in October Lab Results  Component Value Date   CHOL 112 06/27/2020   HDL 53 06/27/2020   LDLCALC 47 06/27/2020   TRIG 53 06/27/2020   CHOLHDL 2.1 06/27/2020   Lab Results  Component Value Date   CREATININE 1.42 (H) 06/27/2020   BUN 15 06/27/2020   NA 140 06/27/2020   K 4.7 06/27/2020   CL 106 06/27/2020   CO2 22 06/27/2020   CBC Latest Ref Rng & Units 09/19/2018 09/12/2018 05/09/2018  WBC 4.0 - 10.5 K/uL 5.7 4.5 5.3  Hemoglobin 13.0 - 17.0 g/dL 9.7(L) 10.5(L) 11.2(L)  Hematocrit 39.0 - 52.0 % 31.9(L) 35.8(L) 35.4(L)  Platelets 150 - 400 K/uL 172 290 178    Lab Results  Component Value Date   HGBA1C 6.4 (H) 11/17/2019   Lab Results  Component Value Date   TSH 1.450 03/25/2018     ==================================================  COVID-19 Education: The signs and symptoms of COVID-19 were discussed with the patient and how to seek care for testing (follow up with PCP or arrange E-visit).    I spent a total of 26 minutes with the patient spent in direct patient consultation.  Additional time spent with chart review  / charting (studies, outside notes, etc): 16 min Total Time: 42 min  Current medicines are reviewed at length with the patient today.  (+/- concerns) n/a  This visit occurred during the SARS-CoV-2 public health emergency.  Safety protocols were in place, including screening questions prior to the visit, additional usage of staff PPE, and extensive cleaning of exam room while observing appropriate contact time as indicated for disinfecting solutions.  Notice: This dictation was prepared with Dragon dictation along with smaller phrase technology. Any transcriptional errors that result from this process are unintentional and may not be corrected upon review.  Patient Instructions / Medication Changes & Studies & Tests Ordered   Patient Instructions  Medication Instructions:   No changes   *  If you need a refill on your cardiac medications before your next appointment, please call your pharmacy*   Lab Work:   Please have your primary check - CMP ,LIPID in Oct 2022 send a copy to cardiology - if labs are not done let office know we can order them   If you have labs (blood work) drawn today and your tests are completely normal, you will receive your results only by: MyChart Message (if you have MyChart) OR A paper copy in the mail If you have any lab test that is abnormal or we need to change your treatment, we will call you to review the results.   Testing/Procedures:  Not needed  Follow-Up: At [facility], you and your health needs are our priority.  As part of our continuing mission to provide you with exceptional heart care, we have  created designated Provider Care Teams.  These Care Teams include your primary Cardiologist (physician) and Advanced Practice Providers (APPs -  Physician Assistants and Nurse Practitioners) who all work together to provide you with the care you need, when you need it.     Your next appointment:   6 month(s)  The format for your next appointment:   In Person  Provider:   [name] Harding, MD     Studies Ordered:   No orders of the defined types were placed in this encounter.    [name] Harding, M.D., M.S. Interventional Cardiologist   Pager # [phone] Phone # [phone] [address] [place], [place] [address]   Thank you for choosing [facility]!!     

## 2021-04-27 NOTE — Patient Instructions (Signed)
Medication Instructions:   No changes   *If you need a refill on your cardiac medications before your next appointment, please call your pharmacy*   Lab Work:   Please have your primary check - CMP ,LIPID in Oct 2022 send a copy to cardiology - if labs are not done let office know we can order them   If you have labs (blood work) drawn today and your tests are completely normal, you will receive your results only by: [place] (if you have MyChart) OR A paper copy in the mail If you have any lab test that is abnormal or we need to change your treatment, we will call you to review the results.   Testing/Procedures:  Not needed  Follow-Up: At [facility], you and your health needs are our priority.  As part of our continuing mission to provide you with exceptional heart care, we have created designated Provider Care Teams.  These Care Teams include your primary Cardiologist (physician) and Advanced Practice Providers (APPs -  Physician Assistants and Nurse Practitioners) who all work together to provide you with the care you need, when you need it.     Your next appointment:   6 month(s)  The format for your next appointment:   In Person  Provider:   [name], MD

## 2021-05-07 ENCOUNTER — Encounter: Payer: Self-pay | Admitting: Cardiology

## 2021-05-07 NOTE — Assessment & Plan Note (Signed)
Extensive PCI to the native LCx as well as overlapping stents to the SVG-RCA system.  On lifelong monotherapy with clopidogrel/Plavix.   Okay to hold Plavix 507 days preop for surgery or procedures.

## 2021-05-07 NOTE — Assessment & Plan Note (Signed)
He has moderate residual disease in the SVG-OM1-OM2 (with no retrograde flow to the native LCx) vessels with overlapping stents in the SVG-PDA-PL.  Patent LIMA to LAD.  Recent Myoview similar to 2019.  Evidence of prior infarct, but no ischemia.  On stable regimen.  Due for labs to be checked.

## 2021-05-07 NOTE — Assessment & Plan Note (Signed)
Borderline blood pressure today on stable regimen of losartan, Lopressor and amlodipine.

## 2021-05-07 NOTE — Assessment & Plan Note (Signed)
No real anginal symptoms since his last PCI of the native LCx in January 2022.  He just had a follow-up Myoview which was read as moderate risk because of the moderate sized infarction however there is no change from 2019 which predated his previous PCI.  Functionally this is a low risk study.  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  Plan:   Continue with combination of Ranexa amlodipine and metoprolol for antianginal benefit with no recurrent symptoms.  On lifelong Plavix monotherapy due to the extent of CAD and PCI:  Okay to hold Plavix 5 to 7 days preop for surgeries or procedures..  On combination of high-dose rosuvastatin plus Zetia

## 2021-05-07 NOTE — Assessment & Plan Note (Signed)
Excellent lipids in October 2021.  Due for follow-up this fall in October.  Asked to discuss with his PCP to make sure that they recheck lipid panel and chemistries along with A1c.  Continue rosuvastatin and Zetia.. On metformin for diabetes.  Will defer to PCP.  Low threshold consider SGLT2 inhibitor.

## 2021-05-17 ENCOUNTER — Other Ambulatory Visit: Payer: Self-pay | Admitting: Cardiology

## 2021-05-17 DIAGNOSIS — I25119 Atherosclerotic heart disease of native coronary artery with unspecified angina pectoris: Secondary | ICD-10-CM

## 2021-06-17 ENCOUNTER — Other Ambulatory Visit: Payer: Self-pay | Admitting: Physician Assistant

## 2021-08-12 ENCOUNTER — Other Ambulatory Visit: Payer: Self-pay | Admitting: Cardiology

## 2021-08-23 ENCOUNTER — Other Ambulatory Visit: Payer: Self-pay | Admitting: Cardiology

## 2021-09-20 ENCOUNTER — Other Ambulatory Visit: Payer: Self-pay | Admitting: Cardiology

## 2021-09-27 ENCOUNTER — Encounter: Payer: Self-pay | Admitting: Cardiology

## 2021-09-27 DIAGNOSIS — R55 Syncope and collapse: Secondary | ICD-10-CM

## 2021-09-28 ENCOUNTER — Other Ambulatory Visit (HOSPITAL_COMMUNITY): Payer: Self-pay | Admitting: Internal Medicine

## 2021-09-28 DIAGNOSIS — I10 Essential (primary) hypertension: Secondary | ICD-10-CM

## 2021-09-28 NOTE — Telephone Encounter (Signed)
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 He was learning to back up/park the truck and states that he has to check truck and was walking around the truck. Then he got back in the cab and sat down in cab and "next thing he knew he was waking up sitting in the cab of the truck". He denies any symptoms before or after. He states that before he left the school that day said that he needs a letter to return to DOT driving/school. His wife picked him up and took him home and "sometime after 1pm" he took his BP and it was 148/76 HR 69. He took BP again at 206pm and BP was 153/77 HR 70. He had MI 2001. CABG 2021 stent placed 2020.  He did see PCP 09-26-21 and his BP there was 140/70 HR 79. She told him the she could not provide letter and he had to get it from Cardiologist.  Pt already has appt scheduled 10-30-2021. We will have to do that letter at that appt. Pt notified. He states that this is OK but, would like to be added to the wait list. Added to wait list.

## 2021-09-29 NOTE — Telephone Encounter (Signed)
If we have to wait for a month in advance.  Just go ahead and order a 14-day patch monitor to evaluate for arrhythmia.  Diagnosis syncope.  [name], MD

## 2021-10-02 ENCOUNTER — Ambulatory Visit (INDEPENDENT_AMBULATORY_CARE_PROVIDER_SITE_OTHER): Payer: Medicare Other

## 2021-10-02 DIAGNOSIS — R55 Syncope and collapse: Secondary | ICD-10-CM

## 2021-10-02 NOTE — Progress Notes (Unsigned)
Enrolled for [employer] to mail a ZIO XT long term holter monitor to the patients address on file.  

## 2021-10-05 DIAGNOSIS — R55 Syncope and collapse: Secondary | ICD-10-CM | POA: Diagnosis not present

## 2021-10-16 ENCOUNTER — Other Ambulatory Visit: Payer: Self-pay | Admitting: Physician Assistant

## 2021-10-16 DIAGNOSIS — E785 Hyperlipidemia, unspecified: Secondary | ICD-10-CM

## 2021-10-17 ENCOUNTER — Ambulatory Visit (HOSPITAL_BASED_OUTPATIENT_CLINIC_OR_DEPARTMENT_OTHER)
Admission: RE | Admit: 2021-10-17 | Discharge: 2021-10-17 | Disposition: A | Discharge status: Home / Self Care | Payer: Medicare Other | Source: Ambulatory Visit | Attending: Internal Medicine | Admitting: Internal Medicine

## 2021-10-17 ENCOUNTER — Other Ambulatory Visit: Payer: Self-pay

## 2021-10-17 ENCOUNTER — Other Ambulatory Visit (HOSPITAL_COMMUNITY): Payer: Self-pay | Admitting: Internal Medicine

## 2021-10-17 DIAGNOSIS — I10 Essential (primary) hypertension: Secondary | ICD-10-CM

## 2021-10-17 LAB — ECHOCARDIOGRAM COMPLETE
Area-P 1/2: 3.27 cm2
S' Lateral: 3.2 cm

## 2021-10-17 NOTE — Progress Notes (Signed)
°  Echocardiogram 2D Echocardiogram has been performed.  Brandon Vasquez F 10/17/2021, 5:08 PM

## 2021-10-30 ENCOUNTER — Encounter: Payer: Self-pay | Admitting: Cardiology

## 2021-10-30 ENCOUNTER — Ambulatory Visit (HOSPITAL_COMMUNITY)
Admission: RE | Admit: 2021-10-30 | Discharge: 2021-10-30 | Disposition: A | Discharge status: Home / Self Care | Payer: Medicare Other | Source: Ambulatory Visit | Attending: Cardiology | Admitting: Cardiology

## 2021-10-30 ENCOUNTER — Other Ambulatory Visit: Payer: Self-pay

## 2021-10-30 ENCOUNTER — Ambulatory Visit (INDEPENDENT_AMBULATORY_CARE_PROVIDER_SITE_OTHER): Payer: Medicare Other | Admitting: Cardiology

## 2021-10-30 VITALS — BP 130/68 | HR 62 | Ht 73.0 in | Wt 198.6 lb

## 2021-10-30 DIAGNOSIS — I10 Essential (primary) hypertension: Secondary | ICD-10-CM

## 2021-10-30 DIAGNOSIS — Z95820 Peripheral vascular angioplasty status with implants and grafts: Secondary | ICD-10-CM | POA: Diagnosis not present

## 2021-10-30 DIAGNOSIS — R55 Syncope and collapse: Secondary | ICD-10-CM | POA: Diagnosis not present

## 2021-10-30 DIAGNOSIS — H9312 Tinnitus, left ear: Secondary | ICD-10-CM

## 2021-10-30 DIAGNOSIS — E785 Hyperlipidemia, unspecified: Secondary | ICD-10-CM

## 2021-10-30 DIAGNOSIS — I214 Non-ST elevation (NSTEMI) myocardial infarction: Secondary | ICD-10-CM

## 2021-10-30 DIAGNOSIS — I25119 Atherosclerotic heart disease of native coronary artery with unspecified angina pectoris: Secondary | ICD-10-CM | POA: Diagnosis not present

## 2021-10-30 DIAGNOSIS — E1169 Type 2 diabetes mellitus with other specified complication: Secondary | ICD-10-CM

## 2021-10-30 DIAGNOSIS — Z951 Presence of aortocoronary bypass graft: Secondary | ICD-10-CM | POA: Diagnosis not present

## 2021-10-30 MED ORDER — AMLODIPINE BESYLATE 10 MG PO TABS: 5.0000 mg | ORAL_TABLET | Freq: Every day | ORAL | 2 refills | Status: DC

## 2021-10-30 MED ORDER — AMLODIPINE BESYLATE 5 MG PO TABS: 5.0000 mg | ORAL_TABLET | Freq: Every day | ORAL | 3 refills | Status: DC

## 2021-10-30 NOTE — Patient Instructions (Addendum)
No driving for 6 months from the last time you passed out/syncopal episode   Medication Instructions:   DECREASE AMLODIPINE TO 1/2 TABLET OF 10 MG  DAILY ( EQUAL 5 MG  TOTAL )   New prescription sent for amlodipine 5 mg    *If you need a refill on your cardiac medications before your next appointment, please call your pharmacy*   Lab Work:  Not needed   Testing/Procedures:  Not needed  Follow-Up: At [facility], you and your health needs are our priority.  As part of our continuing mission to provide you with exceptional heart care, we have created designated Provider Care Teams.  These Care Teams include your primary Cardiologist (physician) and Advanced Practice Providers (APPs -  Physician Assistants and Nurse Practitioners) who all work together to provide you with the care you need, when you need it.     Your next appointment:   4 month(s)  The format for your next appointment:   In Person  Provider:   [name], MD    Other Instructions  WILL CONTACT YOU WITH MONITOR RESULTS ----    No driving for 6 months from the last time you passed out/syncopal episode

## 2021-10-30 NOTE — Progress Notes (Signed)
Primary Care Provider: Rikki Spearing, NP Cardiologist: [name], MD Electrophysiologist: None  Clinic Note: No chief complaint on file.  ===================================  ASSESSMENT/PLAN   Problem List Items Addressed This Visit       Cardiology Problems   Coronary artery disease involving native heart with angina pectoris ([place]) (Chronic)    Now with the SVG to RCA grafted and patent by last cath, patent LIMA to LAD, patent SVG to occluded OM1 and OM 2, he had residual left main and ostial LCx disease treated with atherectomy PCI restoring flow to the distal LCx-OM 3/LPL.  Multiple stents in several different locations, mostly overlapping. No further angina.  Myoview was nonischemic with fixed infarct.  Plan: On combination of Crestor and Zetia for lipids.  Excellent control. On low-dose beta-blocker-Toprol-XL 25 mg daily along with amlodipine for antianginal benefit.  We are reducing amlodipine to 5 mg to allow for mild permissive hypertension in light of vasovagal syncope.   Moderate dose losartan 50 mg.  For afterload reduction On Ranexa for additional antianginal benefit.-Microvascular disease On lifelong Plavix-okay to hold for procedures.      Relevant Medications   amLODipine (NORVASC) 5 MG tablet   Essential hypertension (Chronic)   Relevant Medications   amLODipine (NORVASC) 5 MG tablet   Other Relevant Orders   EKG 12-Lead (Completed)   Hyperlipidemia associated with type 2 diabetes mellitus (HCC) (Chronic)    Most recent lipids total cholesterol 116 LDL 51, A1c of 6.4.  Triglycerides and HDL both good.  He is on 40 of Crestor +10 of Zetia-lipids are well controlled. Only on metformin 500 mg twice daily with the A1c of 6.4.    This seems to be the average of 6.3-6.5 over the last 4 years. Defer to PCP, but would like to see A1c less than 6 given the fact that he has CAD.  Not sure if higher dose of metformin or potentially adding SGLT2 inhibitor, GLP-1  agonist or even sulfonylurea would be beneficial.      Relevant Medications   amLODipine (NORVASC) 5 MG tablet   NSTEMI (non-ST elevated myocardial infarction) (HCC) (Chronic)    Inferior lateral infarct noted on Myoview.  Consistent with non-STEMI with a couple lesion being lesion in the [place] in 2017.  Infarct on Myoview also noted with inferior hypokinesis on echocardiogram.  But otherwise preserved EF. No further angina since staged LM-LCx PCI in January 2020.  On excellent regimen.      Relevant Medications   amLODipine (NORVASC) 5 MG tablet     Other   Hx of CABG x 5: In 2001, in [place] (Chronic)   Relevant Orders   EKG 12-Lead (Completed)   Syncope - Primary    His syncopal episode is somewhat unusual.  The did not really have any witnessed loss of consciousness.  He thinks he may have just blacked out briefly.  It does not seem like he fell asleep.  Most likely etiology is that he was somewhat dehydrated, had a just drained himself getting up into the Truck.  He got lightheaded with a brief surge in blood pressure and then had a vagal drop.  This would make vasovagal/neurocardiogenic syncope and the most likely etiology.  However given his CAD history, need to exclude arrhythmia as well as new cardiomyopathy. -> Echo was essentially normal.  EF the same.  No valvular disease. Carotid Dopplers are due to be done today, not yet read.  Plan: On the off chance that he  had a drop in blood pressure, we will reduce amlodipine to 5 mg daily. Encourage aggressive hydration We actually did order the echocardiogram carotid Dopplers and monitor ahead of time.  The only study that was ready for this visit was the echocardiogram -- that was relatively reassuring.  We have discussed concern that based on state law, with an unexplained syncopal episode, no drug 6 months without recurrence, unless documented evidence of an etiology is found.      Relevant Orders   EKG 12-Lead (Completed)    S/P angioplasty with stent distal LM and LCX. 09/18/18 (Chronic)    Extensive PCI to the LM-LCx as well as overlapping stents in the SVG-RCA.  On lifelong clopidogrel/Plavix monotherapy. Okay to hold Plavix 5-7 days preop for surgeries or procedures.      Relevant Orders   EKG 12-Lead (Completed)    ===================================  HPI:    Brandon Vasquez is a 76 y.o. male with a PMH below who presents today for evaluation of a syncopal episode.Marland Kitchen  CARDIAC HISTORY 2001 (in [place])-> after presenting with cardiac arrest and emergent catheterization  Cath=> MULTIVESSEL CORONARY ARTERY DISEASE s/p CABG 4 (LIMA-LAD {could be to D1-LAD, SVG-RPDA, SVG-OM1-OM 2) i- 04/2016 NSTEMI  -> PCI to SVG-RCA, med Rx of Native Cx disease.  Myoview stress test November 2018: EF 44%.  Medium size moderate severity fixed defect in the basal inferolateral mid inferolateral wall consistent with prior infarct with mild peri-infarct ischemia.  LOW RISK. 05/08/2018: Progressive Angina -  Cath - ISR SVG-RCA =-> DES PCI & PTCA; non-revascularized Native Cx - ostial 90% (mild progression) --> subsequently failed medical management. Myoview November 2019: EF 48%.  Small inferior-lateral wall infarct apex to base with septal thinning at base.  Thought to be consistent with prior MI but no ischemia.  (On my review, this appeared to have clear evidence of at least mild ischemia) January 2020: RECURRENT UNSTABLE ANGINA  Atherectomy based/OCT guided PCI LM-LCx: 2 overlapping DES stent placement (Osiro 3.0 x 32 mm & 3.5 x 23 mm -postdilated and tapered fashion from 4.1 to 2.8 mm) Myoview 3-22: EF 50 to 55%.  Septal wall motion abnormality consistent with CABG.  PVCs noted.  Moderate sized fixed basal to mid inferior inferolateral wall defect consistent with prior infarct with minimal peri-infarct ischemia.  No change.-FUNCTIONALLY LOW RISK  Brandon Vasquez was last seen on April 27, 2021 for routine follow-up.  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  It was read as moderate risk because of previous infarction but there is no change from 2019 which predated the previous PCI.  So functionally a low risk study. => He was doing fairly well.  No major issues.  No chest pain or pressure with rest or exertion.  No exertional dyspnea.  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  She is hoping to get more hours.  Maybe he noted that he is a little older than it used to be and if he overdoes it 1 day he has to take the next day off to rest.  He also noted tinnitus.  Recent Hospitalizations: None  Seen on September 26, 2021 by Dr. Amalia Hailey for an episode of syncope.  This occurred while attending truck driving school.  He "blacked out ".  This occurred after climbing back into his truck.  No chest pain or pressure palpitations associated with it.  Apparently looks somewhat unsteady walking.  He was told to sit down and drink water.  Felt somewhat better.  EMS was not called because he felt better.  Reviewed  CV studies:    The following studies were reviewed today: (if available, images/films reviewed: From Epic Chart or Care Everywhere) 2D Echo 10/17/2021: EF 50 to 55%.  No RWMA.  GR 1 DD.  Moderate LA dilation.  Normal RV size and function.  Normal valves.: Carotid Dopplers performed today-normal Bilateral ICA 1 -39%.  Bilateral vertebral arteries and subclavian arteries normal.   Interval History:   Brandon Vasquez presents here for cardiology evaluation of his syncopal event.  We had the echocardiogram done when I saw him, but the carotid Dopplers and monitor were not ready yet.  Thankfully, he has not had any further episodes since his one spell.  Basically, he says that he has a pretty normal day, he was at truck driving school.  He started the day off with a routine breakfast which was probably some type of processed biscuits or breakfast  burrito along with a couple coffee.  He probably had not had much to drink a day as far as water or other hydration.  The morning went well, and he just got back from lunch break during which he ate a burrito that had a different type of dressing on it that he was used to having.  He was otherwise feeling okay though, and was doing his walk around of the truck which includes bending down to look at the undercarriage.  He then pulled himself into the cab of the truck to complete the evaluation.  It was at that point that he felt somewhat strange, almost like he lifted out of himself and blacked out for a few seconds.  He basically came to somewhat startled after maybe a couple seconds and was well aware where he was.  But when he got out of the truck he was somewhat wobbly and unsteady with walking.  By the time he had a glass of water some crackers, he felt a lot better.  Otherwise he has been doing great.  No chest pain or pressure with rest or exertion.  No palpitations no irregular heartbeats.  No other episodes of syncope or near syncope.  No TIA or amaurosis fugax.   REVIEWED OF SYSTEMS   Review of Systems  Constitutional:  Negative for malaise/fatigue (Not really fatigue.  He just that if he overdoes it on day 1, day 2 is difficult to recover.) and weight loss.  HENT:  Negative for congestion and nosebleeds.   Respiratory: Negative.    Cardiovascular:        Per HPI  Gastrointestinal:  Negative for blood in stool and melena.  Genitourinary:  Negative for hematuria.  Musculoskeletal:  Positive for joint pain (Mild OA pains-hands and knees).  Neurological:  Positive for loss of consciousness (Per HPI). Negative for dizziness (He was barely dizzy when he had the episode.  More of the unsteady gait at the time.), focal weakness and weakness.  Psychiatric/Behavioral:  Negative for depression and memory loss. The patient is not nervous/anxious and does not have insomnia.    I have reviewed and (if  needed) personally updated the patient's problem list, medications, allergies, past medical and surgical history, social and family history.   PAST MEDICAL HISTORY   Past Medical History:  Diagnosis Date   Anemia    Coronary artery disease involving native heart with angina pectoris ([place]) 2005   a. 2005: CABG  x5V in [place]  b. 04/2016: PCI/DES to SVG--> RCA. Severe Ost LCx (with CTO of grafted OMs - no retrograde filling) - distal Cx unprotected; RCA & LAD essentially CTO.;; 05/2018 - SVG-RCA ostial ISR with post-stent 80% - Overlapping DES & high Atm post-dilation of ISR. Progression of Native LM-ostLCx; 1/17/'20 - CSI - DES PCI dLM-ProxCx  2 overlapping DES   Essential hypertension    HCV (hepatitis C virus)    "treated" (05/08/2018)   HLD (hyperlipidemia)    Hx of CABG 2001   [place] - CABG 4: LIMA-LAD, SeqSVG-OM1-OM2,SVG-rPDA   MI (myocardial infarction) ([place]) 2001   Non-ST elevation MI (NSTEMI) ([place]) 04/2016   Culprit lesion was 90% SVG-RPDA -> DES PCI. Also noted 80% ostCx treated medically (subsequent redo PCI of SVG-RCA for ISR-sequential lesion 9/'29, follwed by Staged Atherecotmy-PCI of L-pCx in1/2020)   Type 2 diabetes mellitus with complication ([place])    CAD    PAST SURGICAL HISTORY   Past Surgical History:  Procedure Laterality Date   CARDIAC CATHETERIZATION N/A 04/23/2016   Procedure: Left Heart Cath and Cors/Grafts Angiography;  Surgeon: Jettie Booze, MD;  Location: [facility];  Service: Cardiovascular: ostLAD 75%->pLAD 100%, ostOM1 & OM2 100%, pRCA 80% -> dRCA 50%; LIMA-LAD patent, SVG-OM1-OM2 ~20% prox otw patentt, ost-pSVG-rPDA 90% --> PCI   CARDIAC CATHETERIZATION N/A 04/23/2016   Procedure: Coronary Stent Intervention;  Surgeon: Jettie Booze, MD;  Location: [facility];  Service: Cardiovascular;  Laterality: N/A;  DES PCI - 90% pSVG- PDA (Synergy DES 3.5 x 20)   COLONOSCOPY     CORONARY ARTERY BYPASS GRAFT  2001   LIMA-LAD,  SeqSVG-OM1-OM2,SVG-rPDA   CORONARY ATHERECTOMY N/A 09/18/2018   Procedure: CORONARY ATHERECTOMY (ORBITAL);  Surgeon: Leonie Man, MD;  Location: [facility];  Service: Cardiovascular: dLM-OstCx CSI -OCT - PCI - OCT.    CORONARY STENT INTERVENTION N/A 05/08/2018   Procedure: CORONARY STENT INTERVENTION;  Surgeon: Leonie Man, MD;  Location: [facility];  Service: Cardiovascular;; Ost SVG-RPDA-PL 70% ISR & 75-80% just beyond stent --> Synergy DES 3.5 x 12 (overlapping) with 4.0 mm post-dilation of overlap & entire old stent (especially the Ostum)   CORONARY STENT INTERVENTION N/A 09/18/2018   Procedure: CORONARY STENT INTERVENTION;  Surgeon: Leonie Man, MD;  Location: [facility];  Service: Cardiovascular: 2 overlapping DES stent placement (Osiro 3.0 x 32 mm & 3.5 x 23 mm -postdilated and tapered fashion from 4.1 to 2.8 mm)   INTRAVASCULAR ULTRASOUND/IVUS N/A 09/18/2018   Procedure: Intravascular OCT;  Surgeon: Leonie Man, MD;  Location: [facility];  Service: Cardiovascular: Post CSI Atherectomy OCT identified 2 additional downstream lesions for PCI; post PCI with overlapping DES OCT used to ensure stent apposition /expansion. -- small plaque thrombus extruding through stent @ most distal lesion -- IV Heparin overnight    LAPAROSCOPIC CHOLECYSTECTOMY     LEFT HEART CATH AND CORS/GRAFTS ANGIOGRAPHY N/A 05/08/2018   Procedure: LEFT HEART CATH AND CORS/GRAFTS ANGIOGRAPHY;  Surgeon: Leonie Man, MD;  Location: [facility];  Service: Cardiovascular;; SVG-RPDA-PL 70% ostial ISR & 75% aftter stent; Progression of dLM-OstLCx to ~90-95%.-> PCI of SVG-RPDA-PL. RCA & LAD CTO.  Patent SeqSVG-OM1-OM2 (native OMs ostial CTO - no retrograde flow), Patent LIMA-mLAD   NM MYOVIEW LTD  07/2018   Myoview 07/15/2018:  EF 45-50%. Small size fixed  inferolateral wall defect c/w prior MI. NO ischemia.  LOW RISK   NM  MYOVIEW LTD  11/02/2020   EF 50 to 55%.  Septal  wall motion c/w CABG.  No EKG changes.  PVCs in both rest and recovery.  Mod- sized fixed defect in the basal-mid inferior/inferolateral wall c/w prior infarct & small area of peri-infarct ischemia.  No change from 2019.Marland Kitchen  Read as Moderate Risk b/c Mod-sized infarction, however no change compared to 2019-medical management.  Functionally, low risk.   TRANSTHORACIC ECHOCARDIOGRAM  04/2016   EF 60-65%. GR 2 DD. Inferior hypokinesis. No significant valvular lesions   UPPER GASTROINTESTINAL ENDOSCOPY     05/08/2018: [place] SVG-RPDA-PL 70% ISR & 75-80% just beyond stent --> Synergy DES 3.5 x 12 (overlapping previous stent) => 4.0 mm post-dilation of overlap & entire old stent (esp. Ostum)    09/19/2018: PCI LM-Cx (CSI, OCT)--  Mid LM 95%(involving LAD - that is 100% CTO), Ost Lcx 90%, prox-mid 80% & 75% => 2 overlapping Orsiro DES 3.5 x 23; 3.0 x 30 (post-dilated 4.0 - 3.8 - 3.3 mm)     Immunization History  Administered Date(s) Administered   Influenza, High Dose Seasonal PF 05/09/2018   PFIZER(Purple Top)SARS-COV-2 Vaccination 10/27/2019, 11/17/2019   Pneumococcal Polysaccharide-23 05/09/2018   Tdap 03/28/2017    MEDICATIONS/ALLERGIES   Current Meds  Medication Sig   amLODipine (NORVASC) 5 MG tablet Take 1 tablet (5 mg total) by mouth daily.   clopidogrel (PLAVIX) 75 MG tablet TAKE ONE TABLET BY MOUTH DAILY   doxycycline (VIBRAMYCIN) 100 MG capsule Take 100 mg by mouth 2 (two) times daily.   ezetimibe (ZETIA) 10 MG tablet TAKE ONE TABLET BY MOUTH DAILY   Hypromellose (ARTIFICIAL TEARS OP) Apply 1-2 drops to eye daily as needed (dry eyes).   losartan (COZAAR) 50 MG tablet TAKE ONE TABLET BY MOUTH DAILY   metFORMIN (GLUCOPHAGE-XR) 500 MG 24 hr tablet Take 1 tablet (500 mg total) by mouth daily.   metoprolol succinate (TOPROL-XL) 25 MG 24 hr tablet TAKE ONE TABLET BY MOUTH DAILY   nitroGLYCERIN (NITROSTAT) 0.4 MG SL tablet PLACE ONE TABLET UNDER THE TONGUE EVERY FIVE MINUTES FOR THREE DOSES AS  NEEDED FOR CHEST PAIN   ranolazine (RANEXA) 500 MG 12 hr tablet TAKE ONE TABLET BY MOUTH TWICE A DAY   rosuvastatin (CRESTOR) 40 MG tablet TAKE ONE TABLET BY MOUTH DAILY   [DISCONTINUED] amLODipine (NORVASC) 10 MG tablet TAKE ONE TABLET BY MOUTH DAILY    No Known Allergies  SOCIAL HISTORY/FAMILY HISTORY   Reviewed in Epic:  Pertinent findings:  Social History   Tobacco Use   Smoking status: Former    Packs/day: 1.00    Years: 30.00    Pack years: 30.00    Types: Cigarettes    Quit date: 07/10/1996    Years since quitting: 25.3   Smokeless tobacco: Never  Vaping Use   Vaping Use: Some days   Start date: 01/01/2021   Substances: Nicotine   Devices: vap pen  Substance Use Topics   Alcohol use: Yes    Comment: 05/08/2018 "couple drinks/yyear"   Drug use: Not Currently    Types: Marijuana    Comment: 05/08/2018 "nothing in 2019"   Social History   Social History Narrative   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    OBJCTIVE -PE, EKG, labs   Wt Readings from Last 3 Encounters:  10/30/21 198 lb 9.6 oz (90.1 kg)  04/27/21 198 lb 3.2 oz (89.9  kg)  01/24/21 202 lb (91.6 kg)    Physical Exam: BP 130/68    Pulse 62    Ht '6\' 1"'  (1.854 m)    Wt 198 lb 9.6 oz (90.1 kg)    SpO2 98%    BMI 26.20 kg/m  Physical Exam Vitals reviewed.  Constitutional:      General: He is not in acute distress.    Appearance: Normal appearance. He is normal weight. He is not ill-appearing (Healthy-appearing.  Appears younger than stated age.  Well nourished and well groomed.) or toxic-appearing.  HENT:     Head: Normocephalic and atraumatic.  Neck:     Vascular: Normal carotid pulses. No carotid bruit or JVD.  Cardiovascular:     Rate and Rhythm: Normal rate and regular rhythm. No extrasystoles are present.    Chest Wall: PMI is not displaced.     Pulses: Normal pulses and intact distal pulses.     Heart sounds: S1  normal and S2 normal. No murmur heard.   No friction rub. No gallop.  Pulmonary:     Effort: Pulmonary effort is normal. No respiratory distress.     Breath sounds: No wheezing, rhonchi or rales.  Chest:     Chest wall: No tenderness.  Musculoskeletal:        General: No swelling. Normal range of motion.     Cervical back: Normal range of motion and neck supple.  Skin:    General: Skin is warm and dry.  Neurological:     General: No focal deficit present.     Mental Status: He is alert and oriented to person, place, and time. Mental status is at baseline.     Cranial Nerves: No cranial nerve deficit.     Gait: Gait normal.  Psychiatric:        Mood and Affect: Mood normal.        Behavior: Behavior normal.        Thought Content: Thought content normal.        Judgment: Judgment normal.     Adult ECG Report  Rate: 62;  Rhythm: normal sinus rhythm and normal axis, intervals durations. ;   Narrative Interpretation: Normal EKG.  Recent Labs: Reviewed  [place] Related to Lipid Profile Component 08/10/21 09/12/20 05/15/19  Total Cholesterol 116 110 103  Triglycerides 74 77 60  HDL Cholesterol 50 48 58  LDL Cholesterol Calculated 51 47 33   Total Chol / HDL Cholesterol 2.3 2.3 1.8   Non-HDL Cholesterol 66  62  45   Comprehensive Metabolic Panel Component 93/81/01  08/10/21  01/12/21  09/12/20  08/03/19  05/15/19   Sodium 139 139 140 139 137 138  Potassium 4.3 4.0 3.9 3.8 4.5 4.3  Chloride 106 105 108 105 105 108  CO2 '26 26 23 25 26 23  ' BUN '13 19 16 16 18 19  ' Glucose 104 High   90  91  129 High   114 High   104 High   Creatinine 1.29 1.32 1.23 1.34 1.40 1.48  Calcium 9.5 9.5 9.4 9.5 9.5 9.1  Total Protein 7.5  7.4  -- 7.7  7.8  7.3  Albumin  4.2 4.0 -- 4.0 4.0 4.1  Total Bilirubin 0.6  0.6  -- 0.6  0.6  0.6  Alkaline Phosphatase 49 50 -- 42 51 43  AST (SGOT) 24 25 -- 25 77 High  36  ALT (SGPT) 26 23 --  22 85 High  39   Anion Gap '7 8 9 9 6 7  ' Est. GFR 58 Low   57 Low   62      TSH, 3rd Generation Component 09/26/21  08/10/21   TSH 1.22 1.50   CBC and Differential Component 09/26/21  08/10/21  01/12/21   WBC 4.3 Low  4.6 4.4  RBC 5.81 5.63 5.38  Hemoglobin 13.1 Low  13.2 Low  12.6 Low   Hematocrit 41.6 40.9 Low  39.0 Low   MCV 71.6 Low  72.7 Low  72.5 Low   MCH 22.5 Low  23.5 Low  23.4 Low   MCHC 31.4 Low  32.3 Low  32.2 Low   RDW 15.5 15.6 16.9  Platelets 191 172 170   Iron Profile Component 09/26/21  11/15/20   TIBC 283 339  % SATURATION 31 29  Iron 88 98  Transferrin 202 Low  242  Ferritin Component 09/26/21  08/10/21  01/12/21   Ferritin 73.9 82.4 56.8   Hemoglobin A1C Component 08/10/21  01/12/21  09/12/20   HEMOGLOBIN A1C 6.4 High   6.3 High   6.2 High    eAG 137 134 131   ==================================================  COVID-19 Education: The signs and symptoms of COVID-19 were discussed with the patient and how to seek care for testing (follow up with PCP or arrange E-visit).    I spent a total of 26 minutes with the patient spent in direct patient consultation.  Additional time spent with chart review  / charting (studies, outside notes, etc): 25 min Total Time: 51 min  Current medicines are reviewed at length with the patient today.  (+/- concerns) N/A  This visit occurred during the SARS-CoV-2 public health emergency.  Safety protocols were in place, including screening questions prior to the visit, additional usage of staff PPE, and extensive cleaning of exam room while observing appropriate contact time as indicated for disinfecting solutions.  Notice: This dictation was prepared with Dragon dictation along with smart phrase technology. Any transcriptional errors that result from this process are unintentional and may not be corrected upon review.  Studies Ordered:   Orders Placed This Encounter  Procedures   EKG 12-Lead    Patient Instructions / Medication  Changes & Studies & Tests Ordered   Patient Instructions   No driving for 6 months from the last time you passed out/syncopal episode   Medication Instructions:   DECREASE AMLODIPINE TO 1/2 TABLET OF 10 MG  DAILY ( EQUAL 5 MG  TOTAL )   New prescription sent for amlodipine 5 mg    *If you need a refill on your cardiac medications before your next appointment, please call your pharmacy*   Lab Work:  Not needed   Testing/Procedures:  Not needed  Follow-Up: At [facility], you and your health needs are our priority.  As part of our continuing mission to provide you with exceptional heart care, we have created designated Provider Care Teams.  These Care Teams include your primary Cardiologist (physician) and Advanced Practice Providers (APPs -  Physician Assistants and Nurse Practitioners) who all work together to provide you with the care you need, when you need it.     Your next appointment:   4 month(s)  The format for your next appointment:   In Person  Provider:   [name], MD    Other Instructions  WILL CONTACT YOU WITH MONITOR RESULTS ----    No driving for 6 months from the last  time you passed out/syncopal episode     [name], M.D., M.S. Interventional Cardiologist   Pager # [phone] Phone # [phone] [address] [place], [place] [address]   Thank you for choosing Heartcare at [facility]!!

## 2021-11-05 DIAGNOSIS — R55 Syncope and collapse: Secondary | ICD-10-CM | POA: Insufficient documentation

## 2021-11-05 NOTE — Assessment & Plan Note (Addendum)
Now with the SVG to RCA grafted and patent by last cath, patent LIMA to LAD, patent SVG to occluded OM1 and OM 2, he had residual left main and ostial LCx disease treated with atherectomy PCI restoring flow to the distal LCx-OM 3/LPL. ? ?Multiple stents in several different locations, mostly overlapping. ?No further angina.  Myoview was nonischemic with fixed infarct. ? ?Plan: ?? On combination of Crestor and Zetia for lipids.  Excellent control. ?? On low-dose beta-blocker-Toprol-XL 25 mg daily along with amlodipine for antianginal benefit.  We are reducing amlodipine to 5 mg to allow for mild permissive hypertension in light of vasovagal syncope.   ?? Moderate dose losartan 50 mg.  For afterload reduction ?? On Ranexa for additional antianginal benefit.-Microvascular disease ?? On lifelong Plavix-okay to hold for procedures. ?

## 2021-11-05 NOTE — Assessment & Plan Note (Addendum)
Extensive PCI to the LM-LCx as well as overlapping stents in the SVG-RCA. ? ?? On lifelong clopidogrel/Plavix monotherapy. ?? Okay to hold Plavix 5-7 days preop for surgeries or procedures. ?

## 2021-11-05 NOTE — Assessment & Plan Note (Addendum)
His syncopal episode is somewhat unusual.  The did not really have any witnessed loss of consciousness.  He thinks he may have just blacked out briefly.  It does not seem like he fell asleep.  Most likely etiology is that he was somewhat dehydrated, had a just drained himself getting up into the Truck.  He got lightheaded with a brief surge in blood pressure and then had a vagal drop.  This would make vasovagal/neurocardiogenic syncope and the most likely etiology. ? ?However given his CAD history, need to exclude arrhythmia as well as new cardiomyopathy. ?-> Echo was essentially normal.  EF the same.  No valvular disease. ?Carotid Dopplers are due to be done today, not yet read. ? ?Plan: ?? On the off chance that he had a drop in blood pressure, we will reduce amlodipine to 5 mg daily. ?? Encourage aggressive hydration ?? We actually did order the echocardiogram carotid Dopplers and monitor ahead of time.  The only study that was ready for this visit was the echocardiogram -- that was relatively reassuring. ? ?We have discussed concern that based on state law, with an unexplained syncopal episode, no drug 6 months without recurrence, unless documented evidence of an etiology is found. ?

## 2021-11-05 NOTE — Assessment & Plan Note (Signed)
Most recent lipids total cholesterol 116 LDL 51, A1c of 6.4.  Triglycerides and HDL both good. ? ?He is on 40 of Crestor +10 of Zetia-lipids are well controlled. ?Only on metformin 500 mg twice daily with the A1c of 6.4.    This seems to be the average of 6.3-6.5 over the last 4 years. Defer to PCP, but would like to see A1c less than 6 given the fact that he has CAD.  Not sure if higher dose of metformin or potentially adding SGLT2 inhibitor, GLP-1 agonist or even sulfonylurea would be beneficial. ?

## 2021-11-05 NOTE — Assessment & Plan Note (Signed)
Inferior lateral infarct noted on Myoview.  Consistent with non-STEMI with a couple lesion being lesion in the [place] in 2017.  Infarct on Myoview also noted with inferior hypokinesis on echocardiogram.  But otherwise preserved EF. ?No further angina since staged LM-LCx PCI in January 2020.  On excellent regimen. ?

## 2021-11-13 ENCOUNTER — Other Ambulatory Visit: Payer: Self-pay | Admitting: Physician Assistant

## 2021-11-13 ENCOUNTER — Telehealth: Payer: Self-pay | Admitting: Cardiology

## 2021-11-13 NOTE — Telephone Encounter (Signed)
Medication ordered earlier today ?

## 2021-11-13 NOTE — Telephone Encounter (Signed)
? ? ?*  STAT* If patient is at the pharmacy, call can be transferred to refill team. ? ? ?1. Which medications need to be refilled? (please list name of each medication and dose if known) ranolazine (RANEXA) 500 MG 12 hr tablet ? ?2. Which pharmacy/location (including street and city if local pharmacy) is medication to be sent to? [place], [address] ? ?3. Do they need a 30 day or 90 day supply? 90 days  ? ?Pt is out of meds need refill today ?

## 2021-11-17 ENCOUNTER — Ambulatory Visit: Payer: Medicare Other | Admitting: Cardiology

## 2021-12-19 ENCOUNTER — Other Ambulatory Visit: Payer: Self-pay | Admitting: Cardiology

## 2022-02-23 ENCOUNTER — Encounter: Payer: Self-pay | Admitting: Cardiology

## 2022-02-23 ENCOUNTER — Ambulatory Visit (INDEPENDENT_AMBULATORY_CARE_PROVIDER_SITE_OTHER): Payer: Medicare Other | Admitting: Cardiology

## 2022-02-23 VITALS — BP 138/68 | HR 63 | Ht 73.0 in | Wt 194.6 lb

## 2022-02-23 DIAGNOSIS — R55 Syncope and collapse: Secondary | ICD-10-CM | POA: Diagnosis not present

## 2022-02-23 DIAGNOSIS — E1169 Type 2 diabetes mellitus with other specified complication: Secondary | ICD-10-CM | POA: Diagnosis not present

## 2022-02-23 DIAGNOSIS — I25119 Atherosclerotic heart disease of native coronary artery with unspecified angina pectoris: Secondary | ICD-10-CM | POA: Diagnosis not present

## 2022-02-23 DIAGNOSIS — Z024 Encounter for examination for driving license: Secondary | ICD-10-CM

## 2022-02-23 DIAGNOSIS — Z951 Presence of aortocoronary bypass graft: Secondary | ICD-10-CM | POA: Diagnosis not present

## 2022-02-23 DIAGNOSIS — I10 Essential (primary) hypertension: Secondary | ICD-10-CM | POA: Diagnosis not present

## 2022-02-23 DIAGNOSIS — I2510 Atherosclerotic heart disease of native coronary artery without angina pectoris: Secondary | ICD-10-CM

## 2022-02-23 DIAGNOSIS — E119 Type 2 diabetes mellitus without complications: Secondary | ICD-10-CM

## 2022-02-23 DIAGNOSIS — E785 Hyperlipidemia, unspecified: Secondary | ICD-10-CM

## 2022-02-23 NOTE — Progress Notes (Signed)
Primary Care Provider: Felipa Furnace, NP Cardiologist: Bryan Lemma, MD Electrophysiologist: None  Clinic Note: Chief Complaint  Patient presents with   Near Syncope    Near syncopal episode back in January.  Has not had any since.  Likely vasovagal.  Will need DOT letter.   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  Multiple stents in the native LM-LCx as well as SVG-RCA.  No further angina.  Myoview nonischemic with fixed defect consistent with prior infarct.  Functionally low risk.  Would not repeat Myoview unless symptoms warrant based on clinical reviewed because of existing infarct.  EF still stable.  Plan: On low-dose beta-blocker (Toprol 25 mg) along with amlodipine 5 mg and Ranexa 500 mg twice daily for any anginal therapy. -No plans to push antihypertensives further because of desire for mild permissive hypertension with history of vasovagal syncope. On low-dose losartan for afterload reduction.  Again would not titrate further. On combination of rosuvastatin 40 mg and Zetia 10 mg daily. Lifelong Plavix monotherapy-okay to hold for procedures 7 days preop.      Essential hypertension (Chronic)    Borderline pressures today but plans to allow for mild permissive hypertension with history of vasovagal syncope. He is on stable regimen of losartan, Toprol and amlodipine.      Hyperlipidemia associated with type 2 diabetes mellitus (HCC) - Primary (Chronic)    Lipids look great as of  December 2022. . Due for recheck this December.   Continue current meds-Crestor 40 mg and Zetia 10 mg.        Other   Hx of CABG x 5: In 2001, in [place] (Chronic)   Diabetes mellitus type II, non insulin dependent (HCC) (Chronic)    A1c earlier this month was 6.1.  Improved from 6.4.  He is only on metformin.  Low threshold to consider adding SGLT2 inhibitor.      Encounter for Department of Transportation (DOT) examination for driving license renewal    He had a Myoview stress test done last year that was nonischemic, just showed a fixed defect.  He did not do a standard treadmill test because of hip pain.  This should not be he needs another ischemic evaluation, but just needs clearance following his near syncope/syncope episode back in January.  He has had a relatively normal evaluation since with normal carotids and echo as well as nondiagnostic monitor.  He himself has not not had any more episodes. Return to driving date March 29, 2022.Marland Kitchen  Unless he is actively having symptoms, I do not think we should recheck a Myoview next year for CDL licensure, because it has potentially being equivocal with a fixed defect.      Syncope (Chronic)    Not sure if this was syncopal or near syncopal episode.  Did not really have witnessed loss of consciousness.  He thinks he may have blacked out very briefly, but cannot be sure.  Seems that he may have just fallen asleep.  Episode really  seems consistent with vasovagal syncope. Echo normal, carotid Dopplers normal, monitor did not show any arrhythmias that would explain syncope.  Short little bursts of atrial tachycardia or even 6 beat run of VT not unexpected and likely benign.  He is on a beta-blocker.  Asymptomatic with otherwise normal ischemic evaluation..  His event was on January 27 therefore his 67-month monitoring time post syncope [employer] CDL license or standpoint would be up as of March 29, 2022.  Should be fine to go back to full  licensure.       ===================================  HPI:    Brandon Vasquez is a 76 y.o. male with a Cardiac/CAD History reviwed below who presents today for 35-month history discuss test results at the request of Knox Royalty D, NP.  CAD HISTORY 2001 (in [place])-> after presenting with cardiac arrest and emergent catheterization  Cath=> MULTIVESSEL CORONARY ARTERY DISEASE s/p CABG 4 (LIMA-LAD {could be to D1-LAD, SVG-RPDA, SVG-OM1-OM 2) i- 04/2016 NSTEMI  -> PCI to SVG-RCA, med Rx of Native Cx disease.  Myoview stress test November 2018: EF 44%.  Medium size moderate severity fixed defect in the basal inferolateral mid inferolateral wall consistent with prior infarct with mild peri-infarct ischemia.  LOW RISK. 05/08/2018: Progressive Angina -  Cath - ISR SVG-RCA =-> DES PCI & PTCA; non-revascularized Native Cx - ostial 90% (mild progression) --> subsequently failed medical management. Myoview November 2019: EF 48%.  Small inferior-lateral wall infarct apex to base with septal thinning at base.  Thought to be consistent with prior MI but no ischemia.  January 2020: RECURRENT UNSTABLE ANGINA  Atherectomy based/OCT guided PCI LM-LCx: 2 overlapping DES stent PCI (Osiro 3.0 x 32 mm & 3.5 x 23 mm -tapered post-dil 4.1 to 2.8 mm Myoview 3-22: EF 50 to 55%.  Septal wall motion abnormality consistent with CABG.  PVCs noted.  Moderate sized fixed basal to mid inferior inferolateral wall defect consistent with prior infarct with minimal peri-infarct ischemia.  No change.-FUNCTIONALLY LOW RISK  ATTILA CUSIMANO was last seen on October 30, 2021 for evaluation of a syncopal episode that sounded quite likely consistent with vasovagal syncope.  Complete evaluation, carotid Dopplers ordered today along with event monitor.  Has had already had an echocardiogram done that was relatively benign.  Reduce amlodipine to 5 mg to allow for mild permissive hypertension. => No driving for 6 months after  syncope  Recent Hospitalizations: None  He was just seen by Michelle Nasuti, PA on June 8.  Had not been fasting so lipids were not checked.  BP was a little elevated at 143/76.  Noted that most recent lipid panel on December 2022 and LDL of 51.  No changes made.  No further dizziness or syncopal events since January.  Instructed no driving time until July 8657.  Reviewed  CV studies:    The following studies were reviewed today: (if available, images/films reviewed: From Epic Chart or Care Everywhere) Carotid duplex 10/30/2021: Bilateral ICA 1-39%.  Bilateral subclavian and vertebral arteries normal. Zio patch March 2023: No arrhythmia to suggest reason for syncope.  Zio patch Wear Time: 13 days and 18 hours (2023-02-02T00:17:35-0500 to 2023-02-15T18:23:00-0500)  Predominant rhythm is sinus rhythm with a rate range of 46 to 136 bpm. Average 62 bpm.  Rare PACs and PVCs noted with couplets and triplets. Brief ventricular bigeminy noted.  1 6 beat run of VT-rate 156 bpm. (Not sustained).  9 atrial runs: Fastest 9 beats at a rate of 169 bpm, longest 14 beats at  a rate of 134 bpm.   Basically, the study is pretty normal.  No symptoms noted on monitor.  Mostly sinus rhythm with some premature beats.  1 short run of PVCs called nonsustained ventricular tachycardia, and 9 atrial runs ranging from 9-14 beats.  None of these short bursts would be something the cause syncope.  Short bursts of PVCs with a small scar is not unheard of.   If further syncopal episodes occur, would warrant loop recorder.    Interval History:   ESMOND WIDHALM returns for 73-month follow-up to discuss test results.  He has been doing great from a cardiac standpoint.  No further issues.  He has not had any lightheadedness dizziness or wooziness.  No syncope or near syncope.  No TIA or amaurosis fugax.  He is been doing a really good job of trying to stay adequately hydrated.  He denies any chest pain pressure or dyspnea  with rest or exertion.  No PND, orthopnea or edema.  No rapid irregular beats or palpitations.  States he is not driving currently, he has been trying to get more exercise with walking.  He is easily awaiting getting clearance to go back to driving.  It is costing him money not be under but he understands fully the reasoning behind him having to be on hold for 6 months.  CV Review of Symptoms (Summary) Cardiovascular ROS: no chest pain or dyspnea on exertion negative for - edema, irregular heartbeat, orthopnea, palpitations, paroxysmal nocturnal dyspnea, rapid heart rate, shortness of breath, or further episodes of syncope/near syncope or TIA/amaurosis fugax, claudication.  REVIEWED OF SYSTEMS   Pertinent symptoms not noted above -otherwise negative not noted above. Takes more time to recover if he overdoes it. Mild OA pains.  I have reviewed and (if needed) personally updated the patient's problem list, medications, allergies, past medical and surgical history, social and family history.   PAST MEDICAL HISTORY   Past Medical History:  Diagnosis Date   Anemia    Coronary artery disease involving native heart with angina pectoris (HCC) 2005   a. 2005: CABG x5V in [place]  b. 04/2016: PCI/DES to SVG--> RCA. Severe Ost LCx (with CTO of grafted OMs - no retrograde filling) - distal Cx unprotected; RCA & LAD essentially CTO.;; 05/2018 - SVG-RCA ostial ISR with post-stent 80% - Overlapping DES & high Atm post-dilation of ISR. 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 Also noted 80% ostCx treated medically (subsequent redo PCI of SVG-RCA for  ISR-sequential lesion 9/'29, follwed by Staged Atherecotmy-PCI of L-pCx in1/2020)   Type 2 diabetes mellitus with complication (HCC)    CAD    PAST SURGICAL HISTORY   Past Surgical History:  Procedure Laterality Date   CARDIAC CATHETERIZATION N/A 04/23/2016   Procedure: Left Heart Cath and Cors/Grafts Angiography;  Surgeon: Corky Crafts, MD;  Location: [facility] INVASIVE CV LAB;  Service: Cardiovascular: ostLAD 75%->pLAD 100%, ostOM1 & OM2 100%, pRCA 80% -> dRCA 50%; LIMA-LAD patent, SVG-OM1-OM2 ~20% prox otw patentt, ost-pSVG-rPDA 90% --> PCI   CARDIAC CATHETERIZATION N/A 04/23/2016   Procedure: Coronary Stent Intervention;  Surgeon: Corky Crafts, MD;  Location: [facility] INVASIVE CV LAB;  Service: Cardiovascular;  Laterality: N/A;  DES PCI - 90% pSVG- PDA (Synergy DES 3.5 x 20)   COLONOSCOPY     CORONARY ARTERY BYPASS GRAFT  2001   LIMA-LAD, SeqSVG-OM1-OM2,SVG-rPDA   CORONARY ATHERECTOMY N/A 09/18/2018   Procedure: CORONARY ATHERECTOMY (ORBITAL);  Surgeon: Marykay Lex, MD;  Location: [facility] INVASIVE CV LAB;  Service: Cardiovascular: dLM-OstCx CSI -OCT - PCI - OCT.    CORONARY STENT INTERVENTION N/A 05/08/2018   Procedure: CORONARY STENT INTERVENTION;  Surgeon: Marykay Lex, MD;  Location: [facility] INVASIVE CV LAB;  Service: Cardiovascular;; Ost SVG-RPDA-PL 70% ISR & 75-80% just beyond stent --> Synergy DES 3.5 x 12 (overlapping) with 4.0 mm post-dilation of overlap & entire old stent (especially the Ostum)   CORONARY STENT INTERVENTION N/A 09/18/2018   Procedure: CORONARY STENT INTERVENTION;  Surgeon: Marykay Lex, MD;  Location: [facility] INVASIVE CV LAB;  Service: Cardiovascular: 2 overlapping DES stent placement (Osiro 3.0 x 32 mm & 3.5 x 23 mm -postdilated and tapered fashion from 4.1 to 2.8 mm)   INTRAVASCULAR ULTRASOUND/IVUS N/A 09/18/2018   Procedure: Intravascular OCT;  Surgeon: Marykay Lex, MD;  Location: [facility] INVASIVE CV LAB;  Service: Cardiovascular: Post CSI Atherectomy OCT  identified 2 additional downstream lesions for PCI; post PCI with overlapping DES OCT used to ensure stent apposition /expansion. -- small plaque thrombus extruding through stent @ most distal lesion -- IV Heparin overnight    LAPAROSCOPIC CHOLECYSTECTOMY     LEFT HEART CATH AND CORS/GRAFTS ANGIOGRAPHY N/A 05/08/2018   Procedure: LEFT HEART CATH AND CORS/GRAFTS ANGIOGRAPHY;  Surgeon: Marykay Lex, MD;  Location: [facility] INVASIVE CV LAB;  Service: Cardiovascular;; SVG-RPDA-PL 70% ostial ISR & 75% aftter stent; Progression of dLM-OstLCx to ~90-95%.-> PCI of SVG-RPDA-PL. RCA & LAD CTO.  Patent SeqSVG-OM1-OM2 (native OMs ostial CTO - no retrograde flow), Patent LIMA-mLAD   NM MYOVIEW LTD  07/2018   Myoview 07/15/2018:  EF 45-50%. Small size fixed  inferolateral wall defect c/w prior MI. NO ischemia.  LOW RISK   NM MYOVIEW LTD  11/02/2020   EF 50 to 55%.  Septal wall motion c/w CABG.  No EKG changes.  PVCs in both rest and recovery.  Mod- sized fixed defect in the basal-mid inferior/inferolateral wall c/w prior infarct & small area of peri-infarct ischemia.  No change from 2019.Marland Kitchen  Read as Moderate Risk b/c Mod-sized infarction, however no change compared to 2019-medical management.  Functionally, low risk.   TRANSTHORACIC ECHOCARDIOGRAM  04/2016   EF 60-65%. GR 2 DD. Inferior hypokinesis. No significant valvular lesions   TRANSTHORACIC ECHOCARDIOGRAM  10/17/2021   EF 50 to 55%.  No RWMA.  GR 1 DD.  Moderate LA dilation.  Normal RV size and function.  Normal valves.:   UPPER GASTROINTESTINAL ENDOSCOPY     Zio patch monitor  11/2021   Predominant rhythm = SR w/ HR range of 46 -136 bpm. Avg HR 62 bpm.  Rare PACs and PVCs noted with couplets and triplets. Brief V Bigem noted. 1 6 beat run of VT-rate 156 bpm. (Not sustained).; 9 atrial runs: Fastest 9 beats at a rate of 169 bpm, longest 14 beats at a rate of 134 bpm.   05/08/2018: Ost SVG-RPDA-PL 70% ISR & 75-80% just beyond stent --> DES PCI: Synergy DES 3.5  x 12 (overlaps previous stent) => 4.0 mm post-dilation of overlap & entire old stent (esp. Ostum)   09/19/2018: PCI LM-Cx (CSI,  OCT)--   Mid LM 95%(- LAD -100% CTO), Ost Lcx 90%, p--m 80% & 75% => 2 overlapping Orsiro DES 3.5 x 23; 3.0 x 30 (post-dilated 4.0 - 3.8 - 3.3 mm)      Immunization History  Administered Date(s) Administered   Influenza, High Dose Seasonal PF 05/09/2018   PFIZER(Purple Top)SARS-COV-2 Vaccination 10/27/2019, 11/17/2019   Pneumococcal Polysaccharide-23 05/09/2018   Tdap 03/28/2017    MEDICATIONS/ALLERGIES   Current Meds  Medication Sig   amLODipine (NORVASC) 5 MG tablet Take 1 tablet (5 mg total) by mouth daily.   clopidogrel (PLAVIX) 75 MG tablet TAKE ONE TABLET BY MOUTH DAILY   doxycycline (VIBRAMYCIN) 100 MG capsule Take 100 mg by mouth 2 (two) times daily.   ezetimibe (ZETIA) 10 MG tablet TAKE ONE TABLET BY MOUTH DAILY   Hypromellose (ARTIFICIAL TEARS OP) Apply 1-2 drops to eye daily as needed (dry eyes).   losartan (COZAAR) 50 MG tablet TAKE ONE TABLET BY MOUTH DAILY   metFORMIN (GLUCOPHAGE-XR) 500 MG 24 hr tablet Take 1 tablet (500 mg total) by mouth daily.   metoprolol succinate (TOPROL-XL) 25 MG 24 hr tablet TAKE ONE TABLET BY MOUTH DAILY   nitroGLYCERIN (NITROSTAT) 0.4 MG SL tablet PLACE ONE TABLET UNDER THE TONGUE EVERY FIVE MINUTES FOR THREE DOSES AS NEEDED FOR CHEST PAIN   ranolazine (RANEXA) 500 MG 12 hr tablet TAKE ONE TABLET BY MOUTH TWICE A DAY   rosuvastatin (CRESTOR) 40 MG tablet TAKE ONE TABLET BY MOUTH DAILY    No Known Allergies  SOCIAL HISTORY/FAMILY HISTORY   Reviewed in Epic:  Pertinent findings:  Social History   Tobacco Use   Smoking status: Former    Packs/day: 1.00    Years: 30.00    Total pack years: 30.00    Types: Cigarettes    Quit date: 07/10/1996    Years since quitting: 25.6   Smokeless tobacco: Never  Vaping Use   Vaping Use: Some days   Start date: 01/01/2021   Substances: Nicotine   Devices: vap pen   Substance Use Topics   Alcohol use: Yes    Comment: 05/08/2018 "couple drinks/yyear"   Drug use: Not Currently    Types: Marijuana    Comment: 05/08/2018 "nothing in 2019"   Social History   Social History Narrative   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    OBJCTIVE -PE, EKG, labs   Wt Readings from Last 3 Encounters:  02/23/22 194 lb 9.6 oz (88.3 kg)  10/30/21 198 lb 9.6 oz (90.1 kg)  04/27/21 198 lb 3.2 oz (89.9 kg)    Physical Exam: BP 138/68   Pulse 63   Ht 6\' 1"  (1.854 m)   Wt 194 lb 9.6 oz (88.3 kg)   SpO2 98%   BMI 25.67 kg/m  Physical Exam Vitals reviewed.  Constitutional:      General: He is not in acute distress.    Appearance: Normal appearance. He is normal weight. He is not ill-appearing or toxic-appearing.     Comments: Well-nourished, well-groomed.  Looks younger than stated age  HENT:     Head: Normocephalic and atraumatic.  Neck:     Vascular: No carotid bruit, hepatojugular reflux or JVD.  Cardiovascular:     Rate and Rhythm: Normal rate and regular rhythm. No extrasystoles are present.    Chest Wall: PMI is not displaced.     Pulses: Normal pulses.  Heart sounds: Normal heart sounds, S1 normal and S2 normal. No murmur heard.    No friction rub. No gallop.  Pulmonary:     Effort: Pulmonary effort is normal. No respiratory distress.     Breath sounds: No wheezing, rhonchi or rales.  Chest:     Chest wall: No tenderness.  Musculoskeletal:        General: No swelling. Normal range of motion.     Cervical back: Neck supple.  Skin:    General: Skin is warm and dry.  Neurological:     General: No focal deficit present.     Mental Status: He is alert and oriented to person, place, and time. Mental status is at baseline.     Gait: Gait normal.  Psychiatric:        Mood and Affect: Mood normal.        Behavior: Behavior normal.        Thought Content: Thought  content normal.        Judgment: Judgment normal.     Adult ECG Report Not checked  Recent Labs: 02/08/2022  Ref Range & Units 02/08/2022  WBC 4.4 - 11.0 x 10*3/uL 4.4   RBC 4.50 - 5.90 x 10*6/uL 5.61   Hemoglobin 14.0 - 17.5 G/DL 16.1 Low    Hematocrit 41.5 - 50.4 % 40.6 Low    MCV 80.0 - 96.0 FL 72.5 Low    MCH 27.5 - 33.2 PG 23.4 Low    MCHC 33.0 - 37.0 G/DL 09.6 Low    RDW 04.5 - 17.0 % 15.1   Platelets 150 - 450 X 10*3/uL 165   HgbA1c < 5.7% 6.1 High     Ref Range & Units 2 wk ago  Sodium 135 - 146 MMOL/L 138   Potassium 3.5 - 5.3 MMOL/L 4.3   Chloride 98 - 110 MMOL/L 105   CO2 23 - 30 MMOL/L 26   BUN 8 - 24 MG/DL 16   Glucose 70 - 99 MG/DL 97   Creatinine 4.09 - 1.50 MG/DL 8.11   Calcium 8.5 - 91.4 MG/DL 9.2   Anion Gap 4 - 14 MMOL/L 7   Est. GFR >=60 ML/MIN/1.73 M*2 53 Low      Ref Range & Units 6 mo ago 08/2021  Total Cholesterol 25 - 199 MG/DL 782   Triglycerides 10 - 150 MG/DL 74   HDL Cholesterol 35 - 135 MG/DL 50   LDL Cholesterol Calculated 0 - 99 MG/DL 51   Total Chol / HDL Cholesterol <4.5 2.3   Non-HDL Cholesterol MG/DL 66    ==================================================  COVID-19 Education: The signs and symptoms of COVID-19 were discussed with the patient and how to seek care for testing (follow up with PCP or arrange E-visit).    I spent a total of 24 minutes with the patient spent in direct patient consultation.  Additional time spent with chart review  / charting (studies, outside notes, etc): 17 min Total Time: 41 min  Current medicines are reviewed at length with the patient today.  (+/- concerns) none  This visit occurred during the SARS-CoV-2 public health emergency.  Safety protocols were in place, including screening questions prior to the visit, additional usage of staff PPE, and extensive cleaning of exam room while observing appropriate contact time as indicated for disinfecting solutions.  Notice: This dictation was prepared with  Dragon dictation along with smart phrase technology. Any transcriptional errors that result from this process are unintentional and may not be  corrected upon review.  Studies Ordered:   No orders of the defined types were placed in this encounter.  No orders of the defined types were placed in this encounter.   Patient Instructions / Medication Changes & Studies & Tests Ordered   Patient Instructions  Medication Instructions:  No changes   *If you need a refill on your cardiac medications before your next appointment, please call your pharmacy*   Lab Work: Not  needed  Testing/Procedures:  Not needed  Follow-Up: At [facility], you and your health needs are our priority.  As part of our continuing mission to provide you with exceptional heart care, we have created designated Provider Care Teams.  These Care Teams include your primary Cardiologist (physician) and Advanced Practice Providers (APPs -  Physician Assistants and Nurse Practitioners) who all work together to provide you with the care you need, when you need it.     Your next appointment:   7 month(s)  Jan 2024  The format for your next appointment:   In Person  Provider:   Bryan Lemma, MD    Other Instructions:  Can return to Driving on July 23 , 1191 if no episodes prior to  that day.     Bryan Lemma, M.D., M.S. Interventional Cardiologist   Pager # [phone] Phone # [phone] [address] [place], [place] [address]   Thank you for choosing Heartcare at [facility]!!

## 2022-02-25 ENCOUNTER — Encounter: Payer: Self-pay | Admitting: Cardiology

## 2022-02-25 NOTE — Assessment & Plan Note (Addendum)
A1c earlier this month was 6.1.  Improved from 6.4.  He is only on metformin.  Low threshold to consider adding SGLT2 inhibitor.

## 2022-02-25 NOTE — Assessment & Plan Note (Addendum)
Not sure if this was syncopal or near syncopal episode.  Did not really have witnessed loss of consciousness.  He thinks he may have blacked out very briefly, but cannot be sure.  Seems that he may have just fallen asleep.  Episode really seems consistent with vasovagal syncope. Echo normal, carotid Dopplers normal, monitor did not show any arrhythmias that would explain syncope.  Short little bursts of atrial tachycardia or even 6 beat run of VT not unexpected and likely benign.  He is on a beta-blocker.  Asymptomatic with otherwise normal ischemic evaluation..  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

## 2022-03-30 ENCOUNTER — Other Ambulatory Visit: Payer: Self-pay | Admitting: Cardiology

## 2022-05-23 ENCOUNTER — Other Ambulatory Visit: Payer: Self-pay | Admitting: Physician Assistant

## 2022-05-23 DIAGNOSIS — E785 Hyperlipidemia, unspecified: Secondary | ICD-10-CM

## 2022-06-02 ENCOUNTER — Other Ambulatory Visit: Payer: Self-pay | Admitting: Cardiology

## 2022-07-25 ENCOUNTER — Other Ambulatory Visit: Payer: Self-pay | Admitting: Cardiology

## 2022-07-25 DIAGNOSIS — I25119 Atherosclerotic heart disease of native coronary artery with unspecified angina pectoris: Secondary | ICD-10-CM

## 2022-09-20 ENCOUNTER — Other Ambulatory Visit: Payer: Self-pay | Admitting: Physician Assistant

## 2022-11-07 ENCOUNTER — Other Ambulatory Visit: Payer: Self-pay | Admitting: Cardiology

## 2022-11-07 DIAGNOSIS — I25119 Atherosclerotic heart disease of native coronary artery with unspecified angina pectoris: Secondary | ICD-10-CM

## 2023-01-21 ENCOUNTER — Other Ambulatory Visit: Payer: Self-pay | Admitting: Cardiology

## 2023-01-25 ENCOUNTER — Other Ambulatory Visit: Payer: Self-pay | Admitting: Physician Assistant

## 2023-01-25 DIAGNOSIS — E785 Hyperlipidemia, unspecified: Secondary | ICD-10-CM

## 2023-03-20 ENCOUNTER — Other Ambulatory Visit: Payer: Self-pay | Admitting: Physician Assistant

## 2023-03-20 DIAGNOSIS — E785 Hyperlipidemia, unspecified: Secondary | ICD-10-CM

## 2023-04-20 ENCOUNTER — Other Ambulatory Visit: Payer: Self-pay | Admitting: Cardiology

## 2023-04-20 DIAGNOSIS — I25119 Atherosclerotic heart disease of native coronary artery with unspecified angina pectoris: Secondary | ICD-10-CM

## 2023-04-30 ENCOUNTER — Telehealth: Payer: Self-pay | Admitting: Cardiology

## 2023-04-30 ENCOUNTER — Telehealth: Payer: Self-pay | Admitting: *Deleted

## 2023-04-30 DIAGNOSIS — E785 Hyperlipidemia, unspecified: Secondary | ICD-10-CM

## 2023-04-30 MED ORDER — ROSUVASTATIN CALCIUM 40 MG PO TABS: 40.0000 mg | ORAL_TABLET | Freq: Every day | ORAL | 1 refills | Status: DC

## 2023-04-30 NOTE — Telephone Encounter (Signed)
*  STAT* If patient is at the pharmacy, call can be transferred to refill team.   1. Which medications need to be refilled? (please list name of each medication and dose if known) rosuvastatin (CRESTOR) 40 MG tablet   2. Which pharmacy/location (including street and city if local pharmacy) is medication to be sent to?  [facility] - [place], [place] - [address]      3. Do they need a 30 day or 90 day supply? 90 day    Pt is completely out of medication and has office visit scheduled for 06/28/2023.

## 2023-04-30 NOTE — Telephone Encounter (Signed)
   Pre-operative Risk Assessment    Patient Name: Brandon Vasquez  DOB: 05/13/1946 MRN: [id number]      Request for Surgical Clearance    Procedure:   Colonoscopy,EGD  Date of Surgery:  Clearance TBD                                 Surgeon:  Dr. Gwinda Passe Surgeon's Group or Practice Name:  [facility] Phone number:  [phone] Fax number:  [phone]   Type of Clearance Requested:   - Medical  - Pharmacy:  Hold Clopidogrel (Plavix) 5 days Prior  Type of Anesthesia:  Not Indicated   Additional requests/questions:    Signed, Emmit Pomfret   04/30/2023, 11:54 AM

## 2023-04-30 NOTE — Telephone Encounter (Signed)
Pt's medication was sent to pt's pharmacy as requested. Confirmation received.  °

## 2023-05-01 NOTE — Telephone Encounter (Signed)
   Name: Brandon Vasquez  DOB: October 06, 1945  MRN: [id number]  Primary Cardiologist: Bryan Lemma, MD  Chart reviewed as part of pre-operative protocol coverage. Because of [name] past medical history and time since last visit, he will require a follow-up in-office visit in order to better assess preoperative cardiovascular risk.  Patient has an office visit scheduled with Dr. Herbie Baltimore on 06/28/2023. Appointment notes have been updated to reflect need for pre-op evaluation.   Pre-op covering staff:  - Please contact requesting surgeon's office via preferred method (i.e, phone, fax) to inform them of need for appointment prior to surgery.   Carlos Levering, NP  05/01/2023, 7:53 AM

## 2023-05-01 NOTE — Telephone Encounter (Signed)
Will route to the requesting surgeon's office to make them aware of pt's appointment 06/28/23, clearance will be addressed at that time.

## 2023-06-14 ENCOUNTER — Other Ambulatory Visit: Payer: Self-pay | Admitting: Cardiology

## 2023-06-14 DIAGNOSIS — E785 Hyperlipidemia, unspecified: Secondary | ICD-10-CM

## 2023-06-20 ENCOUNTER — Other Ambulatory Visit: Payer: Self-pay | Admitting: Cardiology

## 2023-06-28 ENCOUNTER — Encounter (HOSPITAL_COMMUNITY): Payer: Self-pay | Admitting: *Deleted

## 2023-06-28 ENCOUNTER — Encounter: Payer: Self-pay | Admitting: Cardiology

## 2023-06-28 ENCOUNTER — Ambulatory Visit: Payer: Medicare Other | Attending: Cardiology | Admitting: Cardiology

## 2023-06-28 VITALS — BP 160/78 | HR 48 | Ht 73.0 in | Wt 177.6 lb

## 2023-06-28 DIAGNOSIS — Z951 Presence of aortocoronary bypass graft: Secondary | ICD-10-CM | POA: Diagnosis present

## 2023-06-28 DIAGNOSIS — Z95820 Peripheral vascular angioplasty status with implants and grafts: Secondary | ICD-10-CM | POA: Insufficient documentation

## 2023-06-28 DIAGNOSIS — I25810 Atherosclerosis of coronary artery bypass graft(s) without angina pectoris: Secondary | ICD-10-CM | POA: Insufficient documentation

## 2023-06-28 DIAGNOSIS — E785 Hyperlipidemia, unspecified: Secondary | ICD-10-CM | POA: Diagnosis present

## 2023-06-28 DIAGNOSIS — I214 Non-ST elevation (NSTEMI) myocardial infarction: Secondary | ICD-10-CM | POA: Diagnosis present

## 2023-06-28 DIAGNOSIS — E1169 Type 2 diabetes mellitus with other specified complication: Secondary | ICD-10-CM | POA: Diagnosis present

## 2023-06-28 DIAGNOSIS — I25719 Atherosclerosis of autologous vein coronary artery bypass graft(s) with unspecified angina pectoris: Secondary | ICD-10-CM | POA: Insufficient documentation

## 2023-06-28 DIAGNOSIS — D500 Iron deficiency anemia secondary to blood loss (chronic): Secondary | ICD-10-CM | POA: Diagnosis present

## 2023-06-28 DIAGNOSIS — I10 Essential (primary) hypertension: Secondary | ICD-10-CM | POA: Diagnosis present

## 2023-06-28 DIAGNOSIS — I2510 Atherosclerotic heart disease of native coronary artery without angina pectoris: Secondary | ICD-10-CM | POA: Diagnosis present

## 2023-06-28 MED ORDER — AMLODIPINE BESYLATE 10 MG PO TABS: 10.0000 mg | ORAL_TABLET | Freq: Every day | ORAL | 3 refills | Status: DC

## 2023-06-28 NOTE — Patient Instructions (Addendum)
Medication Instructions:   Increase Amlodipine 10 mg  daily   *If you need a refill on your cardiac medications before your next appointment, please call your pharmacy*   Lab Work: Not needed   Testing/Procedures:   Will be schedule at [address] Your doctor has scheduled you for a Myocardial Perfusion scan to obtain information about the blood flow to your heart. The test consists of taking pictures of your heart in two phases: while resting and after a stress test.  The stress test will consist of you receiving a drug intended to have a similar effect on the heart to that of exercise.  The test will take approximately 3 to 4  hours to complete.  How to prepare for your test: Do not eat or drink 2 hours prior to your test Do not consume products containing caffeine 12 hours prior to your test (examples: coffee (regular OR decaf), chocolate, sodas, tea) Your doctor may need you to hold certain medications prior to the test.  If so, these are listed below and should not be taken for 24 hours prior to the test.  If not listed below, you may take your medications as normal.  You may resume taking held medications on your normal schedule once the test is complete.   Meds to hold: none Do bring a list of your current medications with you.  If you have held any meds in preparation for the test, please bring them, as you may be required to take them once the test is completed. Do wear comfortable clothes and walking shoes.  Do not wear dresses or overalls. Do NOT wear cologne, perfume, aftershave, or fragranced lotions the day of your test (deodorants okay). If these instructions are not followed your test will have to be rescheduled.   A nuclear cardiologist will review your test, prepare a report and send it to your physician.   If you have questions or concerns about your appointment, you can call the Nuclear Cardiology department at [phone]. If you cannot keep  your appointment, please provide 48 hours notification to avoid a possible $50.00 charge to your account.   Please arrive 15 minutes prior to your appointment time for registration and insurance purposes    Follow-Up: At [facility], you and your health needs are our priority.  As part of our continuing mission to provide you with exceptional heart care, we have created designated Provider Care Teams.  These Care Teams include your primary Cardiologist (physician) and Advanced Practice Providers (APPs -  Physician Assistants and Nurse Practitioners) who all work together to provide you with the care you need, when you need it.     Your next appointment:    3 to 4 month(s) Jan /Feb  The format for your next appointment:   In Person  Provider:   Bryan Lemma, MD    Other Instructions    Monitor your blood pressure fro the next month - send the reading to the office. You ar cleared to have your procedure - Dr  Gwinda Passe

## 2023-06-28 NOTE — Progress Notes (Unsigned)
Cardiology Office Note:  .   Date:  06/29/2023  ID:  Brandon Vasquez, DOB [birth year], MRN [id number] PCP: Felipa Furnace, NP  [facility] Providers Cardiologist:  Bryan Lemma, MD     Chief Complaint  Patient presents with   Follow-up    Delayed follow-up.  Doing well.  Has upcoming colonoscopy.  Recently diagnosed with emphysema   Coronary Artery Disease    No angina   Hypertension    Acknowledges that he had not yet taken his blood pressure medications this morning-was running late.    Patient Profile: .     Brandon Vasquez is a  77 y.o. male  with a PMH reviewed below who presents here for delayed annual follow-up at the request of Knox Royalty D, NP. CAD HISTORY 2001 (in [place])-> after presenting with cardiac arrest and emergent catheterization  Cath=> MULTIVESSEL CORONARY ARTERY DISEASE s/p CABG 4 (LIMA-LAD {could be to D1-LAD, SVG-RPDA, SVG-OM1-OM 2) i- 04/2016 NSTEMI  -> PCI to SVG-RCA, med Rx of Native Cx disease.  Myoview stress test November 2018: EF 44%.  Medium size moderate severity fixed defect in the basal inferolateral mid inferolateral wall consistent with prior infarct with mild peri-infarct ischemia.  LOW RISK. 05/08/2018: Progressive Angina -  Cath - ISR SVG-RCA =-> DES PCI & PTCA; non-revascularized Native Cx - ostial 90% (mild progression) --> subsequently failed medical management. Myoview November 2019: EF 48%.  Small inferior-lateral wall infarct apex to base with septal thinning at base.  Thought to be consistent with prior MI but no ischemia.  January 2020: RECURRENT UNSTABLE ANGINA  Atherectomy based/OCT guided PCI LM-LCx: 2 overlapping DES stent PCI (Osiro 3.0 x 32 mm & 3.5 x 23 mm -tapered post-dil 4.1 to 2.8 mm Myoview 3-22: EF 50 to 55%.  Septal wall motion abnormality consistent with CABG.  PVCs noted.  Moderate sized fixed basal to mid inferior inferolateral wall defect consistent with prior infarct with minimal peri-infarct ischemia.  No  change.-FUNCTIONALLY LOW RISK    Brandon Vasquez was last seen on February 23, 2022 for routine follow-up.  He had not had any further syncopal episodes plan to return driving in July 2023 (he was temporarily not driving for 6 months due to an unusual cycle episode likely related to vasovagal symptoms related to GI upset from overeating).  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  No medication changes made.  Discussed potentially considering SGLT2 inhibitor with A1c of 6.4.  Subjective  Discussed the use of AI scribe software for clinical note transcription with the patient, who gave verbal consent to proceed.  History of Present Illness   The patient, with a history of coronary artery disease and recent diagnosis of emphysema, presents for a follow-up visit. The patient reports no new chest discomfort or angina-like symptoms since the last stent placement in January 2020. The patient has been experiencing wheezing, particularly noticeable when lying down at night, but denies waking up due to breathlessness. The patient also mentions occasional phlegm in the throat but denies any other respiratory symptoms.  The patient has a history of a "weird pass out spell" while getting into a truck, but reports no further episodes of syncope since the last visit. The patient also denies any symptoms of heart failure such as swelling in the legs or shortness of breath when lying flat.  The patient mentions a rapid weight loss that had previously raised concerns about potential  malignancy, but no further details or outcomes of this issue were discussed during the visit. The patient also reports no changes in exercise tolerance or any new exertional symptoms.  The patient's blood pressure was noted to be slightly elevated during the visit, but the patient had not taken his medication yet. The patient's cholesterol levels, last checked in December, were reported to be  well-controlled on current medication regimen of Crestor and Zetia.  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      Cardiovascular ROS: positive for - dyspnea on exertion, orthopnea, shortness of breath, and recently diagnosed with emphysema.  Does note some wheezing and some dyspnea when lying down but not significant exertional dyspnea negative for - chest pain, edema, irregular heartbeat, palpitations, paroxysmal nocturnal dyspnea, rapid heart rate, or lightheadedness, dizziness or wooziness, syncope or near syncope, TIA or amaurosis fugax.  No gross melena or hematochezia but has had apparently positive Hemoccults.  No hematuria or epistaxis.  ROS:  Review of Systems - Negative except newly diagnosed emphysema with some wheezing.   Objective  Studies Reviewed: Marland Kitchen   EKG Interpretation Date/Time:  Friday June 28 2023 10:42:27 EDT Ventricular Rate:  48 PR Interval:  152 QRS Duration:  84 QT Interval:  454 QTC Calculation: 405 R Axis:   81  Text Interpretation: Sinus bradycardia When compared with ECG of 19-Sep-2018 02:51, No significant change was found Confirmed by Bryan Lemma ([id number]) on 06/29/2023 2:01:30 PM    LABS (08/2022) Lipid profile: Total cholesterol 112 mg/dL, Triglycerides 65 mg/dL, HDL 51 mg/dL, LDL 48 mg/dL  DIAGNOSTIC Cath-PCI (6/0/4540)     Intervention - Synergy DES 3.5 x 12 Coronary stent: Severe distal left main-ostial circumflex and proximal circumflex stenoses (80% in 2 sites) Successful Atherectomy based OCT guided PCI with 2 overlapping DES stent placement (Osiro 3.0 x 32 mm & 3.5 x 23 mm -postdilated and tapered fashion from 4.1 to 2.8 mm) (09/2018)   Myoview Stress Test: Mildly reduced ejection fraction; evidence of prior myocardial infarction; no ischemia; ejection fraction around 50% (11/2020) Echocardiogram: EF 50-55%; no significant  RWMA; no valvular abnormalities (10/2021)  Risk Assessment/Calculations:     HYPERTENSION CONTROL Vitals:   06/28/23 1037 06/28/23 1246 06/28/23 1247  BP: (!) 148/70 (!) 152/80 (!) 160/78    The patient's blood pressure is elevated above target today.  In order to address the patient's elevated BP: Blood pressure will be monitored at home to determine if medication changes need to be made. (BP was elevated, he has not yet taken his medications as well as stressed about upcoming colonoscopy.  Will have him monitor his blood pressures over the next several months.  Low threshold to titrate amlodipine to 10 mg daily)           Physical Exam:   VS:  BP (!) 160/78 (BP Location: Right Arm, Patient Position: Sitting, Cuff Size: Normal)   Pulse (!) 48   Ht 6\' 1"  (1.854 m)   Wt 177 lb 9.6 oz (80.6 kg)   SpO2 100%   BMI 23.43 kg/m    Wt Readings from Last 3 Encounters:  06/28/23 177 lb 9.6 oz (80.6 kg)  02/23/22 194 lb 9.6 oz (88.3 kg)  10/30/21 198 lb 9.6 oz (90.1 kg)    GEN: Well nourished, well groomed in no acute distress; healthy-appearing.  NECK: No JVD; No carotid bruits CARDIAC: Normal S1,  S2; RRR, no murmurs, rubs, gallops RESPIRATORY:  Clear to auscultation without rales, wheezing or rhonchi ; nonlabored, good air movement. ABDOMEN: Soft, non-tender, non-distended EXTREMITIES:  No edema; No deformity      ASSESSMENT AND PLAN: .    Problem List Items Addressed This Visit       Cardiology Problems   Coronary artery disease involving autologous artery coronary bypass graft without angina pectoris (Chronic)    Has not had any further angina since his September 19 PCI to the SeqSVG-rPDA-PL followed by staged LM-LCx PCI January 2020.  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 His knee pain would preclude him from medial to walk on treadmill.  Plan: Reassess Myoview stress test for CDL  licensure.      Relevant Medications   amLODipine (NORVASC) 10 MG tablet   Coronary artery disease involving native heart without angina pectoris - Primary (Chronic)    Both native coronary artery at and graft disease. No angina symptoms since stent placement in January 2020. Cholesterol levels well controlled on Crestor and Zetia. 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 -Continue rosuvastatin 40mg  and ezetimibe (Zetia) 10 mg daily => Check lipid profile in December 2024.      Relevant Medications   amLODipine (NORVASC) 10 MG tablet   Other Relevant Orders   EKG 12-Lead (Completed)   Cardiac Stress Test: Informed Consent Details: Physician/Practitioner Attestation; Transcribe to consent form and obtain patient signature   MYOCARDIAL PERFUSION IMAGING   Essential hypertension (Chronic)    Elevated blood pressure noted during visit, but patient had not taken medication yet. -Monitor blood pressure at home at different times of the day for the next month and report findings via MyChart. -F/u to Recheck blood pressure in office in January or February 2025. -Consider increasing Amlodipine dose based on home blood pressure readings.      Relevant Medications   amLODipine (NORVASC) 10 MG tablet   Hyperlipidemia associated with type 2 diabetes mellitus (HCC) (Chronic)    Labs from December showed excellent lipid control with LDL of 48 on existing dose of rosuvastatin 40 mg daily and 10 mg ezetimibe.  Should be due for follow-up labs this December/January.      Relevant Medications   amLODipine (NORVASC) 10 MG tablet   NSTEMI (non-ST elevated myocardial infarction) (HCC) (Chronic)    Sizable inferolateral infarct noted on Myoview basically infarct with  mild peri-infarct ischemia.  Consistent with known anatomy, but no change.  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      Relevant Medications   amLODipine (NORVASC) 10 MG tablet   Other Relevant Orders   Cardiac Stress Test: Informed Consent Details: Physician/Practitioner Attestation; Transcribe to consent form and obtain patient signature   MYOCARDIAL PERFUSION IMAGING     Other   Hx of CABG x 5: In 2001, in [place] (Chronic)    History of CABG x 4.  He has overlapping stent in the SVG to the RCA, but other grafts are patent.  Discussed a graft to the OM branches was patent but there was no retrograde flow into the native circumflex hence PCI of the  left main into circumflex.  For standard surveillance calendar for Myoview stress test in 2026, but with his upcoming CDL license reevaluation, would be due for 1 now since he needs 1 every 2-year.      Relevant Orders   EKG 12-Lead (Completed)   Cardiac Stress Test: Informed Consent Details: Physician/Practitioner Attestation; Transcribe to consent form and obtain patient signature   MYOCARDIAL PERFUSION IMAGING   Iron deficiency anemia (Chronic)    Upcoming EGD/colonoscopy.  Okay to hold Plavix for procedure.      S/P angioplasty with stent distal LM and LCX. 09/18/18 (Chronic)    Remains on clopidogrel 75 mg for maintenance therapy based on the extent of stents in the LCx but also in the RCA graft.  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  okay to hold his Plavix 5 to 7 days preop for surgeries or procedures, can restart when safe 1 to 2 days postop if necessary.      Relevant Orders   Cardiac Stress Test: Informed Consent Details: Physician/Practitioner Attestation; Transcribe to consent form and obtain patient  signature   MYOCARDIAL PERFUSION IMAGING   Emphysema New diagnosis, experiencing wheezing, especially when lying down at night. -Consider further evaluation and treatment if symptoms worsen. = is not on any Rx as of yer  General Health Maintenance -Undergoing upper and lower GI procedures. Hold Plavix for 5 days prior to procedures.        Informed Consent   Shared Decision Making/Informed Consent The risks [chest pain, shortness of breath, cardiac arrhythmias, dizziness, blood pressure fluctuations, myocardial infarction, stroke/transient ischemic attack, nausea, vomiting, allergic reaction, radiation exposure, metallic taste sensation and life-threatening complications (estimated to be 1 in 10,000)], benefits (risk stratification, diagnosing coronary artery disease, treatment guidance) and alternatives of a nuclear stress test were discussed in detail with Mr. Normile and he agrees to proceed.      Dispo: Return in about 3 months (around 09/28/2023) for 3-4 month follow-up, Follow-up in [facility] office. -Follow-up appointment in early 2025 to review blood pressure and stress test results.   Total time spent: 28 min spent with patient + 23 min spent charting = 51 min    Signed, Marykay Lex, MD, MS Bryan Lemma, M.D., M.S. Interventional Cardiologist  [facility]  Pager # [phone] Phone # [phone] [address] [place], [place] [address]

## 2023-06-29 ENCOUNTER — Encounter: Payer: Self-pay | Admitting: Cardiology

## 2023-06-29 NOTE — Assessment & Plan Note (Addendum)
Has not had any further angina since his September 19 PCI to the SeqSVG-rPDA-PL followed by staged LM-LCx PCI January 2020.  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 His knee pain would preclude him from medial to walk on treadmill.  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

## 2023-06-29 NOTE — Assessment & Plan Note (Signed)
History of CABG x 4.  He has overlapping stent in the SVG to the RCA, but other grafts are patent.  Discussed a graft to the OM branches was patent but there was no retrograde flow into the native circumflex hence PCI of the left main into circumflex.  For standard surveillance calendar for Myoview stress test in 2026, but with his upcoming CDL license reevaluation, would be due for 1 now since he needs 1 every 2-year.

## 2023-06-29 NOTE — Assessment & Plan Note (Signed)
Elevated blood pressure noted during visit, but patient had not taken medication yet. -Monitor blood pressure at home at different times of the day for the next month and report findings via MyChart. -F/u to Recheck blood pressure in office in January or February 2025. -Consider increasing Amlodipine dose based on home blood pressure readings.

## 2023-06-29 NOTE — Assessment & Plan Note (Signed)
Sizable inferolateral infarct noted on Myoview basically infarct with mild peri-infarct ischemia.  Consistent with known anatomy, but no change.  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

## 2023-06-29 NOTE — Assessment & Plan Note (Signed)
Upcoming EGD/colonoscopy.  Okay to hold Plavix for procedure.

## 2023-06-29 NOTE — Assessment & Plan Note (Signed)
Labs from December showed excellent lipid control with LDL of 48 on existing dose of rosuvastatin 40 mg daily and 10 mg ezetimibe.  Should be due for follow-up labs this December/January.

## 2023-06-29 NOTE — Assessment & Plan Note (Signed)
Both native coronary artery at and graft disease. No angina symptoms since stent placement in January 2020. Cholesterol levels well controlled on Crestor and Zetia. 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 -Continue rosuvastatin 40mg  and ezetimibe (Zetia) 10 mg daily => Check lipid profile in December 2024.

## 2023-06-29 NOTE — Assessment & Plan Note (Addendum)
Remains on clopidogrel 75 mg for maintenance therapy based on the extent of stents in the LCx but also in the RCA graft.  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  okay to hold his Plavix 5 to 7 days preop for surgeries or procedures, can restart when safe 1 to 2 days postop if necessary.

## 2023-07-03 ENCOUNTER — Ambulatory Visit (HOSPITAL_COMMUNITY): Payer: Medicare Other | Attending: Cardiology

## 2023-07-03 ENCOUNTER — Encounter (HOSPITAL_COMMUNITY): Payer: Self-pay | Admitting: *Deleted

## 2023-07-03 DIAGNOSIS — Z95820 Peripheral vascular angioplasty status with implants and grafts: Secondary | ICD-10-CM | POA: Diagnosis present

## 2023-07-03 DIAGNOSIS — I214 Non-ST elevation (NSTEMI) myocardial infarction: Secondary | ICD-10-CM | POA: Diagnosis present

## 2023-07-03 DIAGNOSIS — I25719 Atherosclerosis of autologous vein coronary artery bypass graft(s) with unspecified angina pectoris: Secondary | ICD-10-CM | POA: Diagnosis present

## 2023-07-03 DIAGNOSIS — I2510 Atherosclerotic heart disease of native coronary artery without angina pectoris: Secondary | ICD-10-CM | POA: Diagnosis present

## 2023-07-03 DIAGNOSIS — Z951 Presence of aortocoronary bypass graft: Secondary | ICD-10-CM | POA: Diagnosis present

## 2023-07-03 HISTORY — PX: NM MYOVIEW LTD: HXRAD82

## 2023-07-03 MED ORDER — TECHNETIUM TC 99M TETROFOSMIN IV KIT
8.6000 | PACK | Freq: Once | INTRAVENOUS | Status: AC | PRN
  Administered 2023-07-03: 8.6 via INTRAVENOUS

## 2023-07-04 ENCOUNTER — Ambulatory Visit (HOSPITAL_COMMUNITY): Payer: Medicare Other | Attending: Cardiology

## 2023-07-04 DIAGNOSIS — I2510 Atherosclerotic heart disease of native coronary artery without angina pectoris: Secondary | ICD-10-CM | POA: Diagnosis not present

## 2023-07-04 LAB — MYOCARDIAL PERFUSION IMAGING
LV dias vol: 100 mL (ref 62–150)
LV sys vol: 53 mL
Nuc Stress EF: 48 %
Peak HR: 86 {beats}/min
Rest HR: 57 {beats}/min
Rest Nuclear Isotope Dose: 8.6 mCi
SDS: 3
SRS: 0
SSS: 3
ST Depression (mm): 0 mm
Stress Nuclear Isotope Dose: 26.2 mCi
TID: 1.05

## 2023-07-04 MED ORDER — TECHNETIUM TC 99M TETROFOSMIN IV KIT
26.2000 | PACK | Freq: Once | INTRAVENOUS | Status: AC | PRN
  Administered 2023-07-04: 26.2 via INTRAVENOUS

## 2023-07-04 MED ORDER — REGADENOSON 0.4 MG/5ML IV SOLN
0.4000 mg | Freq: Once | INTRAVENOUS | Status: AC
  Administered 2023-07-04: 0.4 mg via INTRAVENOUS

## 2023-07-04 NOTE — Progress Notes (Signed)
Good news.  Stress test showed no evidence of significant ischemia. There is evidence of what looks to be of prior infarct with some mildly reduced flow in that area. Heart pump function normal.  The study was read as INTERMEDIATE RISK based on the fact that there is evidence of a prior infarct.  This is essentially the same as the study was done in 2020. 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  Does not warrant further ischemic evaluation   Bryan Lemma, MD

## 2023-07-06 ENCOUNTER — Other Ambulatory Visit: Payer: Self-pay | Admitting: Cardiology

## 2023-07-08 ENCOUNTER — Other Ambulatory Visit: Payer: Self-pay

## 2023-07-08 MED ORDER — METOPROLOL SUCCINATE ER 25 MG PO TB24: 25.0000 mg | ORAL_TABLET | Freq: Every day | ORAL | 3 refills | Status: AC

## 2023-08-31 ENCOUNTER — Other Ambulatory Visit: Payer: Self-pay | Admitting: Cardiology

## 2023-08-31 DIAGNOSIS — I25119 Atherosclerotic heart disease of native coronary artery with unspecified angina pectoris: Secondary | ICD-10-CM

## 2023-08-31 DIAGNOSIS — E785 Hyperlipidemia, unspecified: Secondary | ICD-10-CM

## 2023-10-02 ENCOUNTER — Ambulatory Visit: Payer: Medicare Other | Attending: Cardiology | Admitting: Cardiology

## 2023-10-02 ENCOUNTER — Encounter: Payer: Self-pay | Admitting: Cardiology

## 2023-10-02 VITALS — BP 136/70 | HR 76 | Ht 73.0 in | Wt 176.2 lb

## 2023-10-02 DIAGNOSIS — R634 Abnormal weight loss: Secondary | ICD-10-CM

## 2023-10-02 DIAGNOSIS — I2510 Atherosclerotic heart disease of native coronary artery without angina pectoris: Secondary | ICD-10-CM | POA: Diagnosis present

## 2023-10-02 DIAGNOSIS — E1169 Type 2 diabetes mellitus with other specified complication: Secondary | ICD-10-CM | POA: Diagnosis present

## 2023-10-02 DIAGNOSIS — I214 Non-ST elevation (NSTEMI) myocardial infarction: Secondary | ICD-10-CM

## 2023-10-02 DIAGNOSIS — I25810 Atherosclerosis of coronary artery bypass graft(s) without angina pectoris: Secondary | ICD-10-CM | POA: Diagnosis present

## 2023-10-02 DIAGNOSIS — I10 Essential (primary) hypertension: Secondary | ICD-10-CM | POA: Diagnosis present

## 2023-10-02 DIAGNOSIS — E785 Hyperlipidemia, unspecified: Secondary | ICD-10-CM | POA: Diagnosis present

## 2023-10-02 NOTE — Progress Notes (Unsigned)
Cardiology Office Note:  .   Date:  10/03/2023  ID:  Brandon Vasquez, DOB 12/21/1945, MRN [id number] PCP: Felipa Furnace, NP  Baxter Hire   [facility] Providers Cardiologist:  Bryan Lemma, MD     Chief Complaint  Patient presents with   Follow-up    Doing well.  No major issues   Coronary Artery Disease    No angina    Patient Profile: .     Brandon Vasquez is a very healthy-appearing 78 y.o. male (recently retired [employer]) with a PMH notable for CAD-MIs and PCI's as noted along with CRF's of HTN and HLD as well as DM 2 who presents here for ~3 min f/u at the request of Knox Royalty D, NP.  Brandon Vasquez is a  78 y.o. male  with a PMH reviewed below who presents here for delayed annual follow-up at the request of Knox Royalty D, NP. CAD HISTORY 2001 (in [place])-> after presenting with cardiac arrest and emergent catheterization  Cath=> MULTIVESSEL CORONARY ARTERY DISEASE s/p CABG 4 (LIMA-LAD {could be to D1-LAD, SVG-RPDA, SVG-OM1-OM 2) i- 04/2016 NSTEMI  -> PCI to SVG-RCA, med Rx of Native Cx disease.  Myoview stress test November 2018: EF 44%.  Medium size moderate severity fixed defect in the basal inferolateral mid inferolateral wall consistent with prior infarct with mild peri-infarct ischemia.  LOW RISK. 05/08/2018: Progressive Angina -  Cath - ISR SVG-RCA =-> DES PCI & PTCA; non-revascularized Native Cx - ostial 90% (mild progression) --> subsequently failed medical management. Myoview November 2019: EF 48%.  Small inferior-lateral wall infarct apex to base with septal thinning at base.  Thought to be consistent with prior MI but no ischemia.  January 2020: RECURRENT UNSTABLE ANGINA  Atherectomy based/OCT guided PCI LM-LCx: 2 overlapping DES stent PCI (Osiro 3.0 x 32 mm & 3.5 x 23 mm -tapered post-dil 4.1 to 2.8 mm Myoview 3-22: EF 50 to 55%.  Septal wall motion abnormality consistent with CABG.  PVCs noted.  Moderate sized fixed basal to mid  inferior inferolateral wall defect consistent with prior infarct with minimal peri-infarct ischemia.  No change.-FUNCTIONALLY LOW RISK   Brandon Vasquez was last seen on June 28, 2023.  Noted below but exertional dyspnea with some orthopnea likely related to COPD but no real chest pain or chest pressure.  No arrhythmia symptoms.  Seemingly euvolemic CHF standpoint.  No arrhythmias. 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  History of Present Illness   He is a 78 year old male with coronary artery disease who presents for a follow-up visit after a stress test.  He recently underwent a stress test at a new location due to the relocation of the testing facility. The results indicated a pump function of 45 to 54 percent with a small defect in the basal inferior wall that is partially reversible, consistent with an infarct. These findings are similar to a previous stress test in 2022, suggesting stability. No chest pain, pressure, tightness, heart racing, or skipping beats are reported.  He did not take his blood pressure medication, losartan, yesterday or today, which he believes may have contributed to a higher blood pressure reading during the visit. He usually has well-controlled blood pressure when taking his medication regularly. No symptoms of heart failure, such as swelling in the legs  or shortness of breath when lying flat, are present. He also denies any stroke symptoms, muscle aches, or cramping.  He has experienced a recent weight loss of about 14 pounds, which he attributes to 'old age.' His current weight is 171 pounds, down from 173 pounds. He describes his diet as 'picky' and notes frequent bowel movements, especially after consuming honey in his tea. No constipation is reported, and bowel movements are regular.  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 He has not had his cholesterol levels checked in over a year, but the last recorded levels in 31-Dec-2023were within normal limits.  His father had dementia and passed away at the age of 56.        Objective   Studies Reviewed: Marland Kitchen        LABS (2022/09/02): Total cholesterol: 112 mg/dL;Triglycerides: 65 mg/dL; HDL: 51 mg/dL; LDL: 48 mg/dL  DIAGNOSTIC Myoview Stress Test: Ejection Fraction 45-54%, small defect in the basal inferior wall partially reversible, consistent with infarct (July 04, 2023)  Cardiac cath-PCI 05/08/2018: Sept 2019: SVG-RPDA-RPL 70% ISR & 75% -> (Scoring balloon PTCA & distal overlapping Synergy DES 3.5 x 12 (4.0)) Mid-dist LM 95%, Ost-prox LAD 70%-> prox LAD 100%; Ost-prox LCx 90%, OM1 & OM2 CTO; Patent LIMA-dLAD & Seq SVG-OM1-OM2  Staged PCI Jan 2020: mid-dist LM 95%-90%ost LCx & prox LCx: CSI => Overlapping DES PCI Orsiro 3.0 x 30 - 3.5 x 22 (tapered 4.1-3.6-3.5-2.9) Diagnostic  Dominance: Right    Intervention     Staged Intervention (Jan 2020)      Risk Assessment/Calculations:             Physical Exam:   VS:  BP 136/70   Pulse 76   Ht 6\' 1"  (1.854 m)   Wt 176 lb 3.2 oz (79.9 kg)   SpO2 100%   BMI 23.25 kg/m    Wt Readings from Last 3 Encounters:  10/02/23 176 lb 3.2 oz (79.9 kg)  07/03/23 177 lb (80.3 kg)  06/28/23 177 lb 9.6 oz (80.6 kg)    GEN: Well nourished, well groomed in no acute distress; healthy-appearing.  Good spirits NECK: No JVD; No carotid bruits CARDIAC: Normal S1, S2; RRR, no murmurs, rubs, gallops RESPIRATORY:  Clear to auscultation without rales, wheezing or rhonchi ; nonlabored, good air movement. ABDOMEN: Soft, non-tender, non-distended EXTREMITIES:  No edema; No deformity      ASSESSMENT AND PLAN: .    Problem List Items Addressed This Visit       Cardiology Problems   Coronary artery disease  involving autologous artery coronary bypass graft without angina pectoris (Chronic)   History of PCI to the SVG to R PDA and PL with some ISR as well as postevent stenosis treated with angioplasty and overlapping stent Recent Myoview nonischemic with evidence of prior inferior MI  -Continue to maintain on clopidogrel 75 mg daily, losartan 50 mg daily, Toprol 25 mg daily, as well as Zetia 10 mg and rosuvastatin 40 mg. -Remains on Ranexa 100 mg twice daily with well-controlled angina.      Relevant Orders   Hepatic function panel   Lipid panel   Coronary artery disease involving native heart without angina pectoris - Primary (Chronic)   No further angina since the staged PCI to the left main-LCx. Now essentially stent and graft dependent on essentially lifelong Plavix. Angina well-controlled  with Ranexa 500 mg twice daily and Toprol 25 mg daily Lipids well-controlled on combination of Crestor and Zetia Stable with no new symptoms. Stress test results consistent with prior infarct and no new areas of ischemia. No angina symptoms reported. -Continue current management.      Essential hypertension (Chronic)   Elevated blood pressure likely due to missed doses of Losartan. At baseline well-controlled on amlodipine 10 mg, 50 mg losartan and 25 mg Toprol -Increase Losartan to 1.5 tablets today and tomorrow, then resume regular dosing. -Continue current dose of Toprol      Hyperlipidemia associated with type 2 diabetes mellitus (HCC) (Chronic)   Last cholesterol check in December 2023 was well within goal with an LDL of 48.Marland Kitchen No recent lipid panel available.  I do not see a recent A1c. Currently on 10 mg Zetia +40 mg Crestor along with metformin 500 mg daily -Order lipid panel (along with A1c) to be done at primary care provider's office.      Relevant Orders   Hepatic function panel   Lipid panel   NSTEMI (non-ST elevated myocardial infarction) (HCC) (Chronic)   Relatively preserved EF  after MRI.  No active angina or heart symptoms        Other   Unintentional weight loss (Chronic)   Rapid weight loss of 14 pounds reported, but no concerning findings on recent tests. Patient attributes weight loss to dietary changes and frequent bowel movements.. Has been evaluated by PCP.  Could be related to dietary changes and frequent bowel movements.  Has had GI evaluation as well. -Consider adding a bulking agent to diet to manage frequent bowel movements.      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 - Follow-Up: Return in about 1 year (around 10/01/2024).     Signed, Marykay Lex, MD, MS Bryan Lemma, M.D., M.S. Interventional Cardiologist  [facility]  Pager # [phone] Phone # [phone] [address] [place], [place] [address]

## 2023-10-02 NOTE — Patient Instructions (Signed)
Medication Instructions:   Take 1.5 tablets of Losartan  for today and tomorrow ( since you missed taking medication yesterday )   *If you need a refill on your cardiac medications before your next appointment, please call your pharmacy*   Lab Work:Fasting Lipid Hepatic If you have labs (blood work) drawn today and your tests are completely normal, you will receive your results only by: MyChart Message (if you have MyChart) OR A paper copy in the mail If you have any lab test that is abnormal or we need to change your treatment, we will call you to review the results.   Testing/Procedures:  Not needed  Follow-Up: At [facility], you and your health needs are our priority.  As part of our continuing mission to provide you with exceptional heart care, we have created designated Provider Care Teams.  These Care Teams include your primary Cardiologist (physician) and Advanced Practice Providers (APPs -  Physician Assistants and Nurse Practitioners) who all work together to provide you with the care you need, when you need it.     Your next appointment:   12 month(s)  The format for your next appointment:   In Person  Provider:   Bryan Lemma, MD

## 2023-10-03 ENCOUNTER — Encounter: Payer: Self-pay | Admitting: Cardiology

## 2023-10-03 DIAGNOSIS — R634 Abnormal weight loss: Secondary | ICD-10-CM | POA: Insufficient documentation

## 2023-10-03 NOTE — Assessment & Plan Note (Signed)
Relatively preserved EF after MRI.  No active angina or heart symptoms

## 2023-10-03 NOTE — Assessment & Plan Note (Signed)
Last cholesterol check in December 2023 was well within goal with an LDL of 48.Marland Kitchen No recent lipid panel available.  I do not see a recent A1c. Currently on 10 mg Zetia +40 mg Crestor along with metformin 500 mg daily -Order lipid panel (along with A1c) to be done at primary care provider's office.

## 2023-10-03 NOTE — Assessment & Plan Note (Addendum)
Elevated blood pressure likely due to missed doses of Losartan. At baseline well-controlled on amlodipine 10 mg, 50 mg losartan and 25 mg Toprol -Increase Losartan to 1.5 tablets today and tomorrow, then resume regular dosing. -Continue current dose of Toprol

## 2023-10-03 NOTE — Assessment & Plan Note (Signed)
History of PCI to the SVG to R PDA and PL with some ISR as well as postevent stenosis treated with angioplasty and overlapping stent Recent Myoview nonischemic with evidence of prior inferior MI  -Continue to maintain on clopidogrel 75 mg daily, losartan 50 mg daily, Toprol 25 mg daily, as well as Zetia 10 mg and rosuvastatin 40 mg. -Remains on Ranexa 100 mg twice daily with well-controlled angina.

## 2023-10-03 NOTE — Assessment & Plan Note (Signed)
No further angina since the staged PCI to the left main-LCx. Now essentially stent and graft dependent on essentially lifelong Plavix. Angina well-controlled with Ranexa 500 mg twice daily and Toprol 25 mg daily Lipids well-controlled on combination of Crestor and Zetia Stable with no new symptoms. Stress test results consistent with prior infarct and no new areas of ischemia. No angina symptoms reported. -Continue current management.

## 2023-10-03 NOTE — Assessment & Plan Note (Signed)
Rapid weight loss of 14 pounds reported, but no concerning findings on recent tests. Patient attributes weight loss to dietary changes and frequent bowel movements.. Has been evaluated by PCP.  Could be related to dietary changes and frequent bowel movements.  Has had GI evaluation as well. -Consider adding a bulking agent to diet to manage frequent bowel movements.

## 2023-10-25 ENCOUNTER — Other Ambulatory Visit: Payer: Self-pay | Admitting: Cardiology

## 2023-10-25 ENCOUNTER — Other Ambulatory Visit: Payer: Self-pay | Admitting: Physician Assistant

## 2023-10-28 NOTE — Telephone Encounter (Signed)
 Medication refilled

## 2024-05-16 ENCOUNTER — Other Ambulatory Visit: Payer: Self-pay | Admitting: Cardiology

## 2024-07-09 ENCOUNTER — Other Ambulatory Visit: Payer: Self-pay | Admitting: Cardiology

## 2024-07-09 DIAGNOSIS — I25119 Atherosclerotic heart disease of native coronary artery with unspecified angina pectoris: Secondary | ICD-10-CM

## 2024-07-10 ENCOUNTER — Other Ambulatory Visit: Payer: Self-pay | Admitting: Cardiology

## 2024-07-10 DIAGNOSIS — E785 Hyperlipidemia, unspecified: Secondary | ICD-10-CM

## 2024-09-26 ENCOUNTER — Other Ambulatory Visit: Payer: Self-pay | Admitting: Cardiology

## 2024-10-01 ENCOUNTER — Encounter: Payer: Self-pay | Admitting: Physician Assistant

## 2024-10-01 ENCOUNTER — Ambulatory Visit: Attending: Cardiology | Admitting: Physician Assistant

## 2024-10-01 VITALS — BP 134/70 | HR 69 | Ht 73.0 in | Wt 182.2 lb

## 2024-10-01 DIAGNOSIS — E119 Type 2 diabetes mellitus without complications: Secondary | ICD-10-CM | POA: Insufficient documentation

## 2024-10-01 DIAGNOSIS — Z951 Presence of aortocoronary bypass graft: Secondary | ICD-10-CM | POA: Insufficient documentation

## 2024-10-01 DIAGNOSIS — I214 Non-ST elevation (NSTEMI) myocardial infarction: Secondary | ICD-10-CM

## 2024-10-01 DIAGNOSIS — E785 Hyperlipidemia, unspecified: Secondary | ICD-10-CM | POA: Insufficient documentation

## 2024-10-01 DIAGNOSIS — I2510 Atherosclerotic heart disease of native coronary artery without angina pectoris: Secondary | ICD-10-CM | POA: Diagnosis not present

## 2024-10-01 DIAGNOSIS — E1169 Type 2 diabetes mellitus with other specified complication: Secondary | ICD-10-CM

## 2024-10-01 NOTE — Progress Notes (Signed)
 " Cardiology Office Note   Date:  10/03/2024  ID:  Brandon Vasquez, Brandon Vasquez Jun 22, 1946, MRN [id number] PCP: Gayle Jeoffrey BIRCH, NP  [facility] Providers Cardiologist:  Alm Clay, MD     History of Present Illness Brandon Vasquez is a 79 y.o. male with PMH of CAD s/p CABG, HLD, DM 2 and history of hepatitis C.  Patient had a CABG x4 with LIMA to LAD, SVG to RPDA, sequential SVG to OM1 and OM 2 in 2001 in [place] .  He presented with NSTEMI in August 2017 and underwent PCI to SVG to RCA, medical therapy for native left circumflex disease.  Myoview  in November 2018 showed EF 44%, medium size moderate severity fixed defect in the basal inferolateral, mid inferolateral wall consistent with prior MI with mild peri-infarct ischemia, overall considered low risk. Patient underwent another cardiac catheterization on 05/08/2018 which showed 75% proximal SVG-RPDA- PL lesion treated with 3.5 x 12 mm DES. He also had progression of proximal RCA, distal left main, ostial left circumflex disease. Otherwise patent LIMA to LAD, patent sequential SVG to OM1 and OM 2. Postprocedure, he was restarted on Brilinta  90 mg twice daily.  Myoview  obtained on 07/15/2018 showed EF 48%, small inferior lateral wall defect from apex to the base was septal thinning at the base, no ischemia noted.  He had recurrent unstable angina in January 2020 and underwent repeat cardiac catheterization that showed 95% distal left main into the LAD and left circumflex territory.  The severe distal left main to ostial left circumflex was felt to be the culprit lesion that was treated with 2 overlapping drug-eluting stents.  Heart monitor obtained in February 2023 showed sinus rhythm, a single run of nonsustained VT that is transient, 9 episodes of atrial run lasting up to 14 beats.  Last Myoview  obtained in October 2024 showed a small defect of mild reduction in uptake present in the basal inferior location that is partially reversible, when compared to  the previous stress test in March 2022, there was no significant change, EF 48%.  Patient presents today for annual follow-up.  Last blood work was obtained at PCPs office in October 2025, LDL was 96 which is borderline high however total cholesterol, HDL and triglyceride are all very well-controlled.  I discussed with the patient the LDL goal should be less than 55.  I would encouraged diet and exercise.  He denies any recent exertional chest pain or shortness of breath.  He has no lower extremity edema.  He has mild carotid bruit on physical exam, however previous carotid Doppler in 2023 showed minimal plaque.  Patient denies any dizziness.  Overall, he has been doing well and can follow-up with Dr. Clay in 1 year.  ROS:   He denies chest pain, palpitations, dyspnea, pnd, orthopnea, n, v, dizziness, syncope, edema, weight gain, or early satiety. All other systems reviewed and are otherwise negative except as noted above.    Studies Reviewed EKG Interpretation Date/Time:  Thursday October 01 2024 15:17:04 EST Ventricular Rate:  69 PR Interval:  132 QRS Duration:  94 QT Interval:  406 QTC Calculation: 435 R Axis:   77  Text Interpretation: Normal sinus rhythm Nonspecific ST and T wave abnormality When compared with ECG of 28-Jun-2023 10:42, Nonspecific T wave abnormality now evident in Inferior leads Confirmed by Janene Boer [id number]) on 10/01/2024 3:29:27 PM    Cardiac Studies & Procedures   ______________________________________________________________________________________________ CARDIAC CATHETERIZATION  CARDIAC CATHETERIZATION 09/18/2018  Conclusion Images from the original  result were not included.   Mid LM to Ost LAD lesion is 95% & Ost Cx lesion is 90% stenosed.  Ost LAD to Prox LAD lesion is 99% stenosed. Prox LAD lesion is 100% stenosed.  Following orbital atherectomy, A drug-eluting stent was successfully placed using a STENT ORSIRO 3.5X22 -overlapping the distal stent.  Postdilated to 4.1 mm proximal to 3.6 at the overlap.  Prox Cx lesion is 80% stenosed. Prox Cx to Mid Cx lesion is 75% stenosed. -Seen with postatherectomy OCT -these distal lesions were stented first followed by an upstream stent  After visualization with OCT, a drug-eluting stent was successfully placed using a STENT ORSIRO 3.0X30 coverning both lesions - post dilated to ~2.9-3.25 mm  Post intervention, there is a 0% residual stenosis.  ---------  [place] 1st Mrg lesion is 100% stenosed.  Ost 2nd Mrg to 2nd Mrg lesion is 100% stenosed.  Low normal LVEDP  Post intervention, there is a 0% residual stenosis.  Ost Cx lesion is 90% stenosed.  Prox Cx to Mid Cx lesion is 75% stenosed.  SUMMARY  Severe distal left main-ostial circumflex and proximal circumflex stenoses (80% in 2 sites)  Successful Atherectomy based OCT guided PCI with 2 overlapping DES stent placement (Osiro 3.0 x 32 mm & 3.5 x 23 mm -postdilated and tapered fashion from 4.1 to 2.8 mm)  Normal to low LVEDP treated with IV fluid bolus  Failed Mynx closure with immediate manual pressure held - no hematoma  RECOMMENDATION  Overnight monitoring in the post procedure unit.  OCT indicated a focal area of what appears to be thrombus at the distal stent.  Will run 12 hours of IV heparin  beginning 8 hours after sheath removal.  Continue current medications, but can reduce Imdur  down to 60 mg.  Anticipate discharge on January 17.  Follow-up scheduled.    Alm Clay, MD Alm Clay, M.D., M.S. Interventional Cardiologist  Pager # [phone] Phone # [phone] [address] [place], [place] [address]  Findings Coronary Findings Diagnostic  Dominance: Right  Left Main Vessel is large. Mid LM to Ost LAD lesion is 95% stenosed. The lesion is located at the bifurcation, concentric and irregular.  Left Anterior Descending Ost LAD to Prox LAD lesion is 99% stenosed. Prox LAD lesion is 100%  stenosed.  Left Circumflex Vessel is large. There is mild diffuse disease throughout the vessel. Ost Cx lesion is 90% stenosed. The lesion is calcified. Prox Cx lesion is 80% stenosed. The lesion is irregular and ulcerative. Seen with OCT. Prox Cx to Mid Cx lesion is 75% stenosed. The lesion is eccentric and irregular. Seen by OCT  First Obtuse Marginal Branch Ost 1st Mrg lesion is 100% stenosed.  Second Obtuse Marginal Branch Ost 2nd Mrg to 2nd Mrg lesion is 100% stenosed.  Third Obtuse Marginal Branch There is moderate disease in the vessel.  Sequential Saphenous Graft To 1st Mrg, 2nd Mrg SVG and is large. The graft exhibits no disease. Origin to Prox Graft lesion before 1st Mrg  is 20% stenosed.  Intervention  Mid LM to Ost LAD lesion Atherectomy C[place]IST[place] GUIDE 7FR XB 3.5 guide catheter was inserted. WIRE VIPERWIRE COR FLEX TIP guidewire was used to cross lesion. Orbital atherectomy was performed using a CROWN DIAMONDBACK CLASSIC 1.25. 6 passes taken. 3 low speed and 3 high speed passes Stent Lesion length:  20 mm. Pre-stent angioplasty was performed using a BALLOON SAPPHIRE 3.0X20. Maximum pressure:  12 atm. Inflation time:  20 sec. A drug-eluting stent  was successfully placed using a STENT ORSIRO 3.5X22. Maximum pressure: 16 atm. Inflation time: 30 sec. Minimum lumen area:  4.1 mm. Stent strut is well apposed. Confirmed by OCT to be expanded to from 3.6-4.1. Stent overlaps previously placed stent. Post-stent angioplasty was performed using a BALLOON SAPPHIRE [place] 4.0X12. Maximum pressure: 18 atm. Inflation time:  20 sec. Post-Intervention Lesion Assessment The intervention was successful. Pre-interventional TIMI flow is 3. Post-intervention TIMI flow is 3. Treated lesion length:  52 mm. No complications occurred at this lesion. Ultrasound (IVUS) was performed on the lesion post PCI using a CATH DRAGONFLY OPTIS 2.7FR. Stent well apposed. Moderate plaque burden detected. Lesion  characteristics:  calcified, fibrinous, heterogeneous, fibro-fatty and necrotic core. OCT used after atherectomy and then following initial stent placement to guide post dilation. An additional ultrasound (IVUS) was performed on the lesion post PCI. There is a 0% residual stenosis post intervention.  Ost Cx lesion Stent A stent was successfully placed. Stent from left main into ostial circumflex Post-Intervention Lesion Assessment The intervention was successful. Pre-interventional TIMI flow is 3. Post-intervention TIMI flow is 3. Treated lesion length:  52 mm. No complications occurred at this lesion. Ultrasound (IVUS) was performed on the lesion post PCI. Stent well expanded. There is a 0% residual stenosis post intervention.  Prox Cx lesion Stent (Also treats lesions: Prox Cx to Mid Cx) Lesion length:  20 mm. CATH VISTA GUIDE 7FR XB 3.5 guide catheter was inserted. Lesion crossed with guidewire using a WIRE VIPERWIRE COR FLEX TIP. Pre-stent angioplasty was performed using a BALLOON SAPPHIRE 3.0X20. Maximum pressure:  10 atm. Inflation time:  20 sec. After initial overall atherectomy and OCT, these distal lesions were found. A drug-eluting stent was successfully placed using a STENT ORSIRO 3.0X30. Maximum pressure: 14 atm. Inflation time: 30 sec. Minimum lumen area:  3 mm. Apposition seen on OCT.  Distal stent was actually 2.8 mm in the overlap was 3.8 mm. Post-stent angioplasty was performed using a BALLOON SAPPHIRE [place] 3.5X18. Maximum pressure:  14 atm. Inflation time:  20 sec. An additional 3.25 mm x 12 mm balloon was inflated at the distal lesion to 18 atm. Post-Intervention Lesion Assessment The intervention was successful. There is a 0% residual stenosis post intervention.  Prox Cx to Mid Cx lesion Stent (Also treats lesions: Prox Cx) See details in Prox Cx lesion. Post-Intervention Lesion Assessment The intervention was successful. There is a 10% residual stenosis post  intervention.   CARDIAC CATHETERIZATION  CARDIAC CATHETERIZATION 05/08/2018  Conclusion  Prox RCA lesion is 90% stenosed. Dist RCA lesion is 50% stenosed.  Seq SVG- RPDA-RPL graft was visualized by angiography and is large. -- CULPRIT LESIONS involve Ostial-Proximal Stent ISR & post stent lesion.  SVG-RPDA-PL: Prox Graft lesion is 75% stenosed just after stent  A drug-eluting stent was successfully placed using a STENT SYNERGY DES 3.5X12.  Post intervention, there is a 0% residual stenosis.  SVG-RPDA-PL: Origin to Prox Graft Stent is 70% stenosed.  Post intervention, there is a 0% residual stenosis.  Balloon angioplasty was performed using a BALLOON SAPPHIRE [place] 4.0X8.  =-=-=-=-=-=-=-=-=-  Mid LM to Ost LAD lesion is 95% stenosed.  Ost LAD to Prox LAD lesion is 75% stenosed. Prox LAD lesion is 100% stenosed.  Ost Cx lesion is 90% stenosed. Ost 1st Mrg lesion is 100% stenosed. Ost 2nd Mrg to 2nd Mrg lesion is 100% stenosed.  SVG-OM1-OM2 graft was visualized by angiography and is large. Origin to Prox Graft lesion before 1st Mrg is 20%  stenosed.  LIMA graft was visualized by angiography and is normal in caliber.  =-=-=-=-=-=-=-=-=-  The left ventricular systolic function is normal. The left ventricular ejection fraction is 55-65% by visual estimate.  LV end diastolic pressure is normal.   In-stent restenosis followed by post-stent severe stenosis in the SVG-RPDA-RPL -> successful PTCA of in-stent restenosis with a second Synergy DES (3.5 mm x 12 mm) placed to cover the distal edge lesion.  Progression of proximal RCA, distal Left Main and ostial Circumflex disease.  Otherwise patent LIMA-LAD and SVG-OM1-OM 2.  Plan: The patient will be monitored overnight and discharged tomorrow morning having restarted Brilinta  at 90 mg twice daily. Hold metformin , use sliding scale insulin  Anticipate discharge tomorrow and follow-up with either myself or Scot Ford, PA  Recommend  uninterrupted dual antiplatelet therapy with Aspirin  81mg  daily and Ticagrelor  90mg  twice daily for a minimum of 12 months (ACS - Class I recommendation). -Given the fact that he denies stent overlapping in the vein graft with difficult to expand stent, we will continue on Brilinta  at 60 mg after 6 months.  Can stop aspirin  after 3-6 months. Would be okay to hold Brilinta  6 months post cath.   Alm Clay, MD  Findings Coronary Findings Diagnostic  Dominance: Right  Left Main Vessel is large. Mid LM to Ost LAD lesion is 95% stenosed. The lesion is located at the bifurcation, concentric and irregular.  Left Anterior Descending Ost LAD to Prox LAD lesion is 75% stenosed. Prox LAD lesion is 100% stenosed.  Left Circumflex Vessel is large. There is mild diffuse disease throughout the vessel. Ost Cx lesion is 90% stenosed. The lesion is calcified.  First Obtuse Marginal Branch Ost 1st Mrg lesion is 100% stenosed.  Second Obtuse Marginal Branch Ost 2nd Mrg to 2nd Mrg lesion is 100% stenosed.  Third Obtuse Marginal Branch There is moderate disease in the vessel.  Right Coronary Artery There is mild diffuse disease throughout the vessel. Prox RCA lesion is 90% stenosed. Dist RCA lesion is 50% stenosed.  Sequential Jump Graft Graft To RPDA, 1st RPL Seq SVG- RPDA-RPL graft was visualized by angiography and is large. Origin to Prox Graft lesion before RPDA  is 70% stenosed. The lesion was previously treated using a bare metal stent over 2 years ago. Focal 80% right at the Ostium of the Graft Prox Graft lesion before RPDA  is 75% stenosed. The lesion is located at the bend and eccentric.  Sequential Saphenous Graft To 1st Mrg, 2nd Mrg SVG graft was visualized by angiography and is large. The graft exhibits no disease. Origin to Prox Graft lesion before 1st Mrg  is 20% stenosed.  LIMA LIMA Graft To Mid LAD LIMA graft was visualized by angiography and is normal in  caliber.  Intervention  Origin to Prox Graft lesion before RPDA (Sequential Jump Graft Graft To RPDA, 1st RPL) Angioplasty Lesion length:  20 mm. Balloon angioplasty was performed using a BALLOON SAPPHIRE [place] 4.0X8. [place] Balloon for ISR after initially using 3.5 mm Stent Balloon --> followed by 3.5 x 8 [place] Balloon. Maximum pressure: 20 atm. Inflation time: 20 sec. The very Ostial segment appeared to be under-expanded stent -still difficult to fully expand with 4.0 mm Balloon (10% residual) Stent (Also treats lesions: Prox Graft) Lesion length:  8 mm. CATHETER LAUNCHER 6FR MP1 guide catheter was inserted. Lesion crossed with guidewire using a WIRE ASAHI PROWATER 180CM. Pre-stent angioplasty was not performed. A drug-eluting stent was successfully placed using a STENT SYNERGY DES  3.5X12. Maximum pressure: 16 atm. Inflation time: 30 sec. Minimum lumen area:  3.7 mm. Stent strut is well apposed. Stent overlaps previously placed stent. Post-stent angioplasty was performed using a BALLOON SAPPHIRE [place] 3.5X8. Maximum pressure:  18 atm. Inflation time:  20 sec. The stent balloon was then used to Dilate the prior stent Post-Intervention Lesion Assessment The intervention was successful. Pre-interventional TIMI flow is 3. Post-intervention TIMI flow is 3. No complications occurred at this lesion. There is a 0% residual stenosis post intervention.  Prox Graft lesion before RPDA (Sequential Jump Graft Graft To RPDA, 1st RPL) Stent (Also treats lesions: Origin to Prox Graft) See details in Origin to Prox Graft lesion before RPDA (Sequential Jump Graft Graft To RPDA, 1st RPL). Post-Intervention Lesion Assessment The intervention was successful. Pre-interventional TIMI flow is 3. Post-intervention TIMI flow is 3. Treated lesion length:  8 mm. No complications occurred at this lesion. There is a 0% residual stenosis post intervention.   STRESS TESTS  MYOCARDIAL PERFUSION IMAGING 07/04/2023  Interpretation  Summary   Findings are consistent with ischemia. The study is intermediate risk.   No ST deviation was noted.   LV perfusion is abnormal. Defect 1: There is a small defect with mild reduction in uptake present in the basal inferior location(s) that is partially reversible. There is normal wall motion in the defect area. Consistent with ischemia.   Left ventricular function is abnormal. Global function is mildly reduced. Nuclear stress EF: 48%. The left ventricular ejection fraction is mildly decreased (45-54%). End diastolic cavity size is normal. End systolic cavity size is normal.   Prior study available for comparison from 11/02/2020.   ECHOCARDIOGRAM  ECHOCARDIOGRAM COMPLETE 10/17/2021  Narrative ECHOCARDIOGRAM REPORT    Patient Name:   [name] BOLIVAR KORANDA Date of Exam: 10/17/2021 Medical Rec #:  [id number]       Height:       73.0 in Accession #:    [id number]      Weight:       198.2 lb Date of Birth:  [birth year]       BSA:          2.143 m Patient Age:    21 years        BP:           148/70 mmHg Patient Gender: M               HR:           68 bpm. Exam Location:  [place] [facility]  Procedure: 2D Echo, Cardiac Doppler, Color Doppler and 3D Echo  Indications:    Hypertension  History:        Patient has prior history of Echocardiogram examinations, most recent 04/22/2016. Prior CABG. Per patient passed out. Anxiety.  Sonographer:    Vernell Hammersmith RDCS Referring Phys: [id number] TRUMAN PATRIC ROMNEY  IMPRESSIONS   1. Left ventricular ejection fraction, by estimation, is 50 to 55%. The left ventricle has low normal function. The left ventricle has no regional wall motion abnormalities. Left ventricular diastolic parameters are consistent with Grade I diastolic dysfunction (impaired relaxation). 2. Right ventricular systolic function is normal. The right ventricular size is normal. 3. Left atrial size was moderately dilated. 4. The mitral valve is normal in structure. No evidence of mitral  valve regurgitation. No evidence of mitral stenosis. 5. The aortic valve is normal in structure. Aortic valve regurgitation is not visualized. No aortic stenosis is present. 6. The inferior vena cava is normal in  size with greater than 50% respiratory variability, suggesting right atrial pressure of 3 mmHg.  FINDINGS Left Ventricle: Left ventricular ejection fraction, by estimation, is 50 to 55%. The left ventricle has low normal function. The left ventricle has no regional wall motion abnormalities. The left ventricular internal cavity size was normal in size. There is no left ventricular hypertrophy. Left ventricular diastolic parameters are consistent with Grade I diastolic dysfunction (impaired relaxation).  Right Ventricle: The right ventricular size is normal. No increase in right ventricular wall thickness. Right ventricular systolic function is normal.  Left Atrium: Left atrial size was moderately dilated.  Right Atrium: Right atrial size was normal in size.  Pericardium: There is no evidence of pericardial effusion.  Mitral Valve: The mitral valve is normal in structure. No evidence of mitral valve regurgitation. No evidence of mitral valve stenosis.  Tricuspid Valve: The tricuspid valve is normal in structure. Tricuspid valve regurgitation is not demonstrated. No evidence of tricuspid stenosis.  Aortic Valve: The aortic valve is normal in structure. Aortic valve regurgitation is not visualized. No aortic stenosis is present.  Pulmonic Valve: The pulmonic valve was normal in structure. Pulmonic valve regurgitation is not visualized. No evidence of pulmonic stenosis.  Aorta: The aortic root is normal in size and structure.  Venous: The inferior vena cava is normal in size with greater than 50% respiratory variability, suggesting right atrial pressure of 3 mmHg.  IAS/Shunts: No atrial level shunt detected by color flow Doppler.   LEFT VENTRICLE PLAX 2D LVIDd:         4.70 cm    Diastology LVIDs:         3.20 cm   LV e' medial:    9.90 cm/s LV PW:         1.10 cm   LV E/e' medial:  7.1 LV IVS:        1.10 cm   LV e' lateral:   12.50 cm/s LVOT diam:     2.10 cm   LV E/e' lateral: 5.7 LV SV:         62 LV SV Index:   29 LVOT Area:     3.46 cm  3D Volume EF: 3D EF:        60 % LV EDV:       139 ml LV ESV:       55 ml LV SV:        84 ml  RIGHT VENTRICLE RV Basal diam:  4.00 cm RV Mid diam:    3.70 cm RV S prime:     9.08 cm/s TAPSE (M-mode): 1.8 cm  LEFT ATRIUM             Index        RIGHT ATRIUM           Index LA diam:        4.60 cm 2.15 cm/m   RA Area:     21.10 cm LA Vol (A2C):   66.6 ml 31.07 ml/m  RA Volume:   62.00 ml  28.93 ml/m LA Vol (A4C):   71.2 ml 33.22 ml/m LA Biplane Vol: 73.4 ml 34.24 ml/m AORTIC VALVE LVOT Vmax:   88.10 cm/s LVOT Vmean:  50.600 cm/s LVOT VTI:    0.178 m  AORTA Ao Root diam: 3.00 cm Ao Asc diam:  3.40 cm  MITRAL VALVE MV Area (PHT): 3.27 cm    SHUNTS MV Decel Time: 232 msec    Systemic VTI:  0.18  m MV E velocity: 70.70 cm/s  Systemic Diam: 2.10 cm MV A velocity: 78.00 cm/s MV E/A ratio:  0.91  Jennifer Crape MD Electronically signed by Jennifer Crape MD Signature Date/Time: 10/17/2021/5:48:10 PM    Final    MONITORS  LONG TERM MONITOR (3-14 DAYS) 10/30/2021  Narrative  Zio patch Wear Time: 13 days and 18 hours (2023-02-02T00:17:35-0500 to 2023-02-15T18:23:00-0500)  Predominant rhythm is sinus rhythm with a rate range of 46 to 136 bpm. Average 62 bpm.  Rare PACs and PVCs noted with couplets and triplets. Brief ventricular bigeminy noted.  1 6 beat run of VT-rate 156 bpm. (Not sustained).  9 atrial runs: Fastest 9 beats at a rate of 169 bpm, longest 14 beats at a rate of 134 bpm.  Basically, the study is pretty normal.  No symptoms noted on monitor.  Mostly sinus rhythm with some premature beats.  1 short run of PVCs called nonsustained ventricular tachycardia, and 9 atrial runs ranging  from 9-14 beats.  None of these short bursts would be something the cause syncope. Short bursts of PVCs but a small scar is not unheard of.  If further syncopal episodes occur, would warrant loop recorder.   Alm Clay, MD       ______________________________________________________________________________________________      Risk Assessment/Calculations          Physical Exam VS:  BP 134/70 (BP Location: Left Arm, Patient Position: Sitting, Cuff Size: Large)   Pulse 69   Ht 6' 1 (1.854 m)   Wt 182 lb 3.2 oz (82.6 kg)   SpO2 97%   BMI 24.04 kg/m        Wt Readings from Last 3 Encounters:  10/01/24 182 lb 3.2 oz (82.6 kg)  10/02/23 176 lb 3.2 oz (79.9 kg)  07/03/23 177 lb (80.3 kg)    GEN: Well nourished, well developed in no acute distress NECK: No JVD; No carotid bruits CARDIAC: RRR, no murmurs, rubs, gallops RESPIRATORY:  Clear to auscultation without rales, wheezing or rhonchi  ABDOMEN: Soft, non-tender, non-distended EXTREMITIES:  No edema; No deformity   ASSESSMENT AND PLAN  CAD s/p CABG: Denies any recent chest pain.  Continue Plavix  therapy.  Hyperlipidemia: On Zetia  and rosuvastatin  40 mg daily.  Most recent LDL 62, LDL goal should be less than 55 given history of CABG.  Recommend diet and exercise  DM2: Managed by primary care provider.       Dispo: Follow-up with Dr. Clay in 1 year  Signed, Scot Ford, [place]  "

## 2024-10-01 NOTE — Patient Instructions (Signed)
 Medication Instructions:  NO CHANGES *If you need a refill on your cardiac medications before your next appointment, please call your pharmacy*  Lab Work: NO LABS If you have labs (blood work) drawn today and your tests are completely normal, you will receive your results only by: MyChart Message (if you have MyChart) OR A paper copy in the mail If you have any lab test that is abnormal or we need to change your treatment, we will call you to review the results.  Testing/Procedures: NO TESTING  Follow-Up: At [facility], you and your health needs are our priority.  As part of our continuing mission to provide you with exceptional heart care, our providers are all part of one team.  This team includes your primary Cardiologist (physician) and Advanced Practice Providers or APPs (Physician Assistants and Nurse Practitioners) who all work together to provide you with the care you need, when you need it.  Your next appointment:   1 year(s)  Provider:   Randene Bustard, MD
